# Patient Record
Sex: Male | Born: 1940 | Race: White | Hispanic: No | Smoking: Former smoker
Health system: Southern US, Community
[De-identification: ages and names within clinical notes are randomized; demographics above are authoritative.]

## PROBLEM LIST (undated history)

## (undated) DIAGNOSIS — N2889 Other specified disorders of kidney and ureter: Secondary | ICD-10-CM

## (undated) DIAGNOSIS — R972 Elevated prostate specific antigen [PSA]: Secondary | ICD-10-CM

## (undated) DIAGNOSIS — N471 Phimosis: Secondary | ICD-10-CM

## (undated) DIAGNOSIS — N189 Chronic kidney disease, unspecified: Secondary | ICD-10-CM

## (undated) DIAGNOSIS — R319 Hematuria, unspecified: Secondary | ICD-10-CM

## (undated) DIAGNOSIS — Z8719 Personal history of other diseases of the digestive system: Secondary | ICD-10-CM

## (undated) DIAGNOSIS — I219 Acute myocardial infarction, unspecified: Secondary | ICD-10-CM

## (undated) DIAGNOSIS — N400 Enlarged prostate without lower urinary tract symptoms: Secondary | ICD-10-CM

## (undated) DIAGNOSIS — R310 Gross hematuria: Secondary | ICD-10-CM

## (undated) DIAGNOSIS — I10 Essential (primary) hypertension: Secondary | ICD-10-CM

## (undated) DIAGNOSIS — K219 Gastro-esophageal reflux disease without esophagitis: Secondary | ICD-10-CM

## (undated) DIAGNOSIS — R0600 Dyspnea, unspecified: Secondary | ICD-10-CM

## (undated) HISTORY — DX: Hematuria, unspecified: R31.9

## (undated) HISTORY — DX: Phimosis: N47.1

## (undated) HISTORY — DX: Gross hematuria: R31.0

## (undated) HISTORY — DX: Benign prostatic hyperplasia without lower urinary tract symptoms: N40.0

## (undated) HISTORY — DX: Elevated prostate specific antigen (PSA): R97.20

## (undated) HISTORY — DX: Other specified disorders of kidney and ureter: N28.89

---

## 2000-06-18 HISTORY — PX: CORONARY ARTERY BYPASS GRAFT: SHX141

## 2009-06-18 HISTORY — PX: INGUINAL HERNIA REPAIR: SUR1180

## 2012-06-18 HISTORY — PX: RENAL BIOPSY: SHX156

## 2012-08-04 ENCOUNTER — Ambulatory Visit: Payer: Self-pay

## 2012-09-15 ENCOUNTER — Ambulatory Visit: Payer: Self-pay | Admitting: Urology

## 2012-09-15 LAB — APTT: Activated PTT: 28.2 secs (ref 23.6–35.9)

## 2012-09-15 LAB — PROTIME-INR: Prothrombin Time: 12.3 secs (ref 11.5–14.7)

## 2012-09-15 LAB — PLATELET COUNT: Platelet: 199 10*3/uL (ref 150–440)

## 2012-09-17 LAB — PATHOLOGY REPORT

## 2013-05-26 ENCOUNTER — Ambulatory Visit: Payer: Self-pay | Admitting: Urology

## 2014-02-19 LAB — LIPID PANEL
Cholesterol: 228 mg/dL — AB (ref 0–200)
HDL: 31 mg/dL — AB (ref 35–70)
LDL CALC: 143 mg/dL
TRIGLYCERIDES: 269 mg/dL — AB (ref 40–160)

## 2014-02-21 LAB — CBC AND DIFFERENTIAL: HEMOGLOBIN: 13.5 g/dL (ref 13.5–17.5)

## 2014-08-20 DIAGNOSIS — I2510 Atherosclerotic heart disease of native coronary artery without angina pectoris: Secondary | ICD-10-CM | POA: Diagnosis not present

## 2014-08-20 DIAGNOSIS — I10 Essential (primary) hypertension: Secondary | ICD-10-CM | POA: Diagnosis not present

## 2014-08-20 DIAGNOSIS — N289 Disorder of kidney and ureter, unspecified: Secondary | ICD-10-CM | POA: Diagnosis not present

## 2014-08-20 DIAGNOSIS — E785 Hyperlipidemia, unspecified: Secondary | ICD-10-CM | POA: Diagnosis not present

## 2014-08-20 DIAGNOSIS — D692 Other nonthrombocytopenic purpura: Secondary | ICD-10-CM | POA: Diagnosis not present

## 2014-08-20 LAB — BASIC METABOLIC PANEL
BUN: 24 mg/dL — AB (ref 4–21)
Creatinine: 1.4 mg/dL — AB (ref <=1.3)

## 2015-04-03 ENCOUNTER — Encounter: Payer: Self-pay | Admitting: Internal Medicine

## 2015-04-06 ENCOUNTER — Other Ambulatory Visit: Payer: Self-pay

## 2015-04-06 DIAGNOSIS — J3489 Other specified disorders of nose and nasal sinuses: Secondary | ICD-10-CM | POA: Insufficient documentation

## 2015-04-06 DIAGNOSIS — I25119 Atherosclerotic heart disease of native coronary artery with unspecified angina pectoris: Secondary | ICD-10-CM | POA: Insufficient documentation

## 2015-04-06 DIAGNOSIS — I739 Peripheral vascular disease, unspecified: Secondary | ICD-10-CM | POA: Insufficient documentation

## 2015-04-06 DIAGNOSIS — I10 Essential (primary) hypertension: Secondary | ICD-10-CM | POA: Insufficient documentation

## 2015-04-06 DIAGNOSIS — E785 Hyperlipidemia, unspecified: Secondary | ICD-10-CM | POA: Insufficient documentation

## 2015-04-06 DIAGNOSIS — N051 Unspecified nephritic syndrome with focal and segmental glomerular lesions: Secondary | ICD-10-CM | POA: Insufficient documentation

## 2015-04-06 DIAGNOSIS — R310 Gross hematuria: Secondary | ICD-10-CM | POA: Insufficient documentation

## 2015-04-06 DIAGNOSIS — G629 Polyneuropathy, unspecified: Secondary | ICD-10-CM | POA: Insufficient documentation

## 2015-04-06 DIAGNOSIS — D692 Other nonthrombocytopenic purpura: Secondary | ICD-10-CM | POA: Insufficient documentation

## 2015-05-26 ENCOUNTER — Ambulatory Visit (INDEPENDENT_AMBULATORY_CARE_PROVIDER_SITE_OTHER): Payer: Medicare Other | Admitting: Internal Medicine

## 2015-05-26 ENCOUNTER — Encounter: Payer: Self-pay | Admitting: Internal Medicine

## 2015-05-26 VITALS — BP 142/86 | HR 60 | Ht 72.0 in | Wt 208.0 lb

## 2015-05-26 DIAGNOSIS — Z23 Encounter for immunization: Secondary | ICD-10-CM | POA: Diagnosis not present

## 2015-05-26 DIAGNOSIS — I739 Peripheral vascular disease, unspecified: Secondary | ICD-10-CM

## 2015-05-26 DIAGNOSIS — N289 Disorder of kidney and ureter, unspecified: Secondary | ICD-10-CM | POA: Diagnosis not present

## 2015-05-26 DIAGNOSIS — I2510 Atherosclerotic heart disease of native coronary artery without angina pectoris: Secondary | ICD-10-CM

## 2015-05-26 DIAGNOSIS — H6123 Impacted cerumen, bilateral: Secondary | ICD-10-CM

## 2015-05-26 DIAGNOSIS — G629 Polyneuropathy, unspecified: Secondary | ICD-10-CM | POA: Diagnosis not present

## 2015-05-26 DIAGNOSIS — N401 Enlarged prostate with lower urinary tract symptoms: Secondary | ICD-10-CM | POA: Diagnosis not present

## 2015-05-26 DIAGNOSIS — R351 Nocturia: Secondary | ICD-10-CM | POA: Diagnosis not present

## 2015-05-26 DIAGNOSIS — Z0000 Encounter for general adult medical examination without abnormal findings: Secondary | ICD-10-CM | POA: Diagnosis not present

## 2015-05-26 DIAGNOSIS — I10 Essential (primary) hypertension: Secondary | ICD-10-CM | POA: Diagnosis not present

## 2015-05-26 DIAGNOSIS — E785 Hyperlipidemia, unspecified: Secondary | ICD-10-CM

## 2015-05-26 LAB — POCT URINALYSIS DIPSTICK
Bilirubin, UA: NEGATIVE
Glucose, UA: NEGATIVE
KETONES UA: NEGATIVE
Leukocytes, UA: NEGATIVE
Nitrite, UA: NEGATIVE
PH UA: 5
SPEC GRAV UA: 1.02
Urobilinogen, UA: 0.2

## 2015-05-26 MED ORDER — LISINOPRIL 40 MG PO TABS: 40.0000 mg | ORAL_TABLET | Freq: Every day | ORAL | Status: DC

## 2015-05-26 MED ORDER — METOPROLOL TARTRATE 50 MG PO TABS
50.0000 mg | ORAL_TABLET | Freq: Two times a day (BID) | ORAL | Status: DC
Start: 2015-05-26 — End: 2016-06-29

## 2015-05-26 MED ORDER — ATORVASTATIN CALCIUM 40 MG PO TABS: 40.0000 mg | ORAL_TABLET | Freq: Every day | ORAL | Status: DC

## 2015-05-26 MED ORDER — TAMSULOSIN HCL 0.4 MG PO CAPS: 0.4000 mg | ORAL_CAPSULE | Freq: Every day | ORAL | Status: DC

## 2015-05-26 NOTE — Progress Notes (Signed)
Patient: Arthur Bowen, Male    DOB: 11-30-1940, 74 y.o.   MRN: [id number] Visit Date: 05/26/2015  Today's Provider: Halina Maidens, MD   Chief Complaint  Patient presents with  . Medicare Wellness  . Hypertension   Subjective:    Annual wellness visit Arthur Bowen is a 74 y.o. male who presents today for his Subsequent Annual Wellness Visit. He feels well. He reports exercising by walking and hunting.Marland Kitchen He reports he is sleeping well.   ----------------------------------------------------------- Hypertension This is a chronic problem. The current episode started more than 1 year ago. The problem is unchanged. The problem is controlled. Pertinent negatives include no chest pain, headaches, malaise/fatigue, palpitations, peripheral edema or shortness of breath. Past treatments include beta blockers and ACE inhibitors. The current treatment provides significant improvement. There are no compliance problems.  Hypertensive end-organ damage includes kidney disease and CAD/MI. Identifiable causes of hypertension include chronic renal disease.  Hyperlipidemia This is a chronic problem. The problem is controlled. Recent lipid tests were reviewed and are normal. Exacerbating diseases include chronic renal disease. He has no history of diabetes. Pertinent negatives include no chest pain or shortness of breath. Current antihyperlipidemic treatment includes statins. The current treatment provides significant improvement of lipids. There are no compliance problems.   CAD - He had CABG in 2002 and has done well.  He walks 1/2 mile per day without SOB or chest pain.  He denies claudication.  He does not see a cardiologist.  He has been complaint with his medication.  Review of Systems  Constitutional: Negative for fever, chills, malaise/fatigue, diaphoresis and fatigue.  HENT: Positive for hearing loss. Negative for sore throat, trouble swallowing and voice change.   Eyes: Negative for visual disturbance.   Respiratory: Negative for chest tightness, shortness of breath and wheezing.   Cardiovascular: Negative for chest pain, palpitations and leg swelling.  Gastrointestinal: Negative for abdominal pain, diarrhea, constipation and blood in stool.  Genitourinary: Positive for urgency (nocturia x 2). Negative for dysuria and difficulty urinating.  Musculoskeletal: Negative for back pain and joint swelling.  Neurological: Negative for dizziness and headaches. Numbness: neuropathy in both feet.  Hematological: Negative for adenopathy. Bruises/bleeds easily.  Psychiatric/Behavioral: Negative for sleep disturbance, dysphoric mood and decreased concentration.    Social History   Social History  . Marital Status: Divorced    Spouse Name: N/A  . Number of Children: N/A  . Years of Education: N/A   Occupational History  . Not on file.   Social History Main Topics  . Smoking status: Former Research scientist (life sciences)  . Smokeless tobacco: Not on file  . Alcohol Use: Not on file  . Drug Use: Not on file  . Sexual Activity: Not on file   Other Topics Concern  . Not on file   Social History Narrative    Patient Active Problem List   Diagnosis Date Noted  . CAD in native artery 04/06/2015  . Dyslipidemia 04/06/2015  . Essential (primary) hypertension 04/06/2015  . Frank hematuria 04/06/2015  . Neuropathy ([place]) 04/06/2015  . Dry nares 04/06/2015  . Peripheral vascular disease ([place]) 04/06/2015  . Impaired renal function 04/06/2015  . Senile purpura ([place]) 04/06/2015    Past Surgical History  Procedure Laterality Date  . Coronary artery bypass graft  2002    x3 @ [place]  . Inguinal hernia repair Left 2011  . Renal biopsy  2014    neg in w/u renal mass/hematuria    His family history includes Lung cancer  in his father; Lymphoma in his mother.    Previous Medications   ASPIRIN 81 MG TABLET    Take 1 tablet by mouth daily.   ATORVASTATIN (LIPITOR) 40 MG TABLET    Take 1 tablet by mouth daily.    CYANOCOBALAMIN 100 MCG LOZG    Take by mouth.   LISINOPRIL (PRINIVIL,ZESTRIL) 40 MG TABLET    Take 1 tablet by mouth daily.   METOPROLOL (LOPRESSOR) 50 MG TABLET    Take 1 tablet by mouth 2 (two) times daily.   OMEGA-3 FATTY ACIDS (FISH OIL) 1000 MG CPDR    Take 1 capsule by mouth daily.   TAMSULOSIN (FLOMAX) 0.4 MG CAPS CAPSULE    Take 1 capsule by mouth daily.    No care team member to display     Objective:   Vitals: BP 142/86 mmHg  Pulse 60  Ht 6' (1.829 m)  Wt 208 lb (94.348 kg)  BMI 28.20 kg/m2  Physical Exam  Constitutional: He is oriented to person, place, and time. He appears well-developed and well-nourished.  HENT:  Head: Normocephalic.  Right Ear: External ear normal. Decreased hearing is noted.  Left Ear: External ear normal. Decreased hearing is noted.  Nose: Nose normal.  Mouth/Throat: Uvula is midline and oropharynx is clear and moist.  Impacted cerumen bilaterally  Eyes: Conjunctivae and EOM are normal. Pupils are equal, round, and reactive to light.  Neck: Normal range of motion. Neck supple. Carotid bruit is not present. No thyromegaly present.  Cardiovascular: Normal rate, regular rhythm and normal heart sounds.   Pulses:      Dorsalis pedis pulses are 1+ on the right side, and 1+ on the left side.       Posterior tibial pulses are 1+ on the right side, and 1+ on the left side.  Pulmonary/Chest: Effort normal and breath sounds normal. He has no wheezes. Right breast exhibits no mass. Left breast exhibits no mass.  Abdominal: Soft. Normal appearance and bowel sounds are normal. There is no hepatosplenomegaly. There is no tenderness.  Musculoskeletal: Normal range of motion. He exhibits tenderness. He exhibits no edema.  Lymphadenopathy:    He has no cervical adenopathy.  Neurological: He is alert and oriented to person, place, and time. He has normal strength and normal reflexes. No cranial nerve deficit.  Skin: Skin is warm and dry. Ecchymosis noted.   Multiple AK over back and chest  Psychiatric: He has a normal mood and affect. His speech is normal and behavior is normal. Judgment and thought content normal.  Nursing note and vitals reviewed.   Activities of Daily Living In your present state of health, do you have any difficulty performing the following activities: 05/26/2015  Hearing? N  Vision? N  Difficulty concentrating or making decisions? N  Walking or climbing stairs? N  Dressing or bathing? N  Doing errands, shopping? N    Fall Risk Assessment Fall Risk  05/26/2015  Falls in the past year? No     Patient reports there are safety devices in place in shower at home.   Depression Screen PHQ 2/9 Scores 05/26/2015  PHQ - 2 Score 0    Cognitive Testing - 6-CIT   Correct? Score   What year is it? yes 0 Yes = 0    No = 4  What month is it? yes 0 Yes = 0    No = 3  Remember:     Pia Mau, [place], [place]  What time is it? yes 0 Yes = 0    No = 3  Count backwards from 20 to 1 yes 0 Correct = 0    1 error = 2   More than 1 error = 4  Say the months of the year in reverse. no 2 Correct = 0    1 error = 2   More than 1 error = 4  What address did I ask you to remember? no 2 Correct = 0  1 error = 2    2 error = 4    3 error = 6    4 error = 8    All wrong = 10       TOTAL SCORE  4/28   Interpretation:  Normal  Normal (0-7) Abnormal (8-28)        Assessment & Plan:     Annual Wellness Visit  Reviewed patient's Family Medical History Reviewed and updated list of patient's medical providers Assessment of cognitive impairment was done Assessed patient's functional ability Established a written schedule for health screening [place] Completed and Reviewed  Exercise Activities and Dietary recommendations Goals    None      Immunization History  Administered Date(s) Administered  . Pneumococcal Conjugate-13 02/19/2014  . Pneumococcal Polysaccharide-23 03/20/2012  . Tdap  03/20/2012    Health Maintenance  Topic Date Due  . ZOSTAVAX  09/09/1998  . INFLUENZA VACCINE  01/17/2015  . TETANUS/TDAP  03/20/2022  . PNA vac Low Risk Adult  Completed     Discussed health benefits of physical activity, and encouraged him to engage in regular exercise appropriate for his age and condition.    ------------------------------------------------------------------------------------------------------------  1. Medicare annual wellness visit, subsequent Medicare wellness measures are satisfied - POCT urinalysis dipstick  2. Flu vaccine need - Flu Vaccine QUAD 36+ mos PF IM (Fluarix & Fluzone Quad PF)  3. CAD in native artery Stable, status post CABG in 2002 Continue current medications and daily physical activity  4. Essential (primary) hypertension Adequate control on 2 medications - lisinopril (PRINIVIL,ZESTRIL) 40 MG tablet; Take 1 tablet (40 mg total) by mouth daily.  Dispense: 90 tablet; Refill: 3 - metoprolol (LOPRESSOR) 50 MG tablet; Take 1 tablet (50 mg total) by mouth 2 (two) times daily.  Dispense: 180 tablet; Refill: 3 - CBC with Differential/Platelet - Comprehensive metabolic panel  5. Peripheral vascular disease (HCC) Sufficient pulses in both feet without symptoms of claudication  6. Neuropathy (HCC) Stable; did not respond to gabapentin  7. Dyslipidemia On statin therapy - atorvastatin (LIPITOR) 40 MG tablet; Take 1 tablet (40 mg total) by mouth daily.  Dispense: 90 tablet; Refill: 3 - Lipid panel  8. BPH associated with nocturia Well-controlled on Flomax - tamsulosin (FLOMAX) 0.4 MG CAPS capsule; Take 1 capsule (0.4 mg total) by mouth daily.  Dispense: 90 capsule; Refill: 3 - PSA  9. Cerumen impaction, bilateral Recommend ENT for cleaning   Halina Maidens, MD [facility]  05/26/2015

## 2015-05-26 NOTE — Patient Instructions (Addendum)
Health Maintenance  Topic Date Due  . ZOSTAVAX  09/09/1998  . INFLUENZA VACCINE  01/17/2015  . TETANUS/TDAP  03/20/2022  . PNA vac Low Risk Adult  Completed   You have impacted cerumen in both ears.  Please schedule an appointment with ENT to have your ears cleaned.

## 2015-05-27 LAB — CBC WITH DIFFERENTIAL/PLATELET
BASOS: 0 %
Basophils Absolute: 0 10*3/uL (ref 0.0–0.2)
EOS (ABSOLUTE): 0.6 10*3/uL — ABNORMAL HIGH (ref 0.0–0.4)
EOS: 7 %
HEMATOCRIT: 42.5 % (ref 37.5–51.0)
Hemoglobin: 14 g/dL (ref 12.6–17.7)
IMMATURE GRANS (ABS): 0 10*3/uL (ref 0.0–0.1)
IMMATURE GRANULOCYTES: 0 %
Lymphocytes Absolute: 2.7 10*3/uL (ref 0.7–3.1)
Lymphs: 32 %
MCH: 30 pg (ref 26.6–33.0)
MCHC: 32.9 g/dL (ref 31.5–35.7)
MCV: 91 fL (ref 79–97)
MONOS ABS: 0.5 10*3/uL (ref 0.1–0.9)
Monocytes: 6 %
NEUTROS ABS: 4.6 10*3/uL (ref 1.4–7.0)
NEUTROS PCT: 55 %
Platelets: 258 10*3/uL (ref 150–379)
RBC: 4.66 x10E6/uL (ref 4.14–5.80)
RDW: 14.8 % (ref 12.3–15.4)
WBC: 8.4 10*3/uL (ref 3.4–10.8)

## 2015-05-27 LAB — COMPREHENSIVE METABOLIC PANEL
A/G RATIO: 1.9 (ref 1.1–2.5)
ALT: 22 IU/L (ref 0–44)
AST: 20 IU/L (ref 0–40)
Albumin: 3.8 g/dL (ref 3.5–4.8)
Alkaline Phosphatase: 120 IU/L — ABNORMAL HIGH (ref 39–117)
BILIRUBIN TOTAL: 0.4 mg/dL (ref 0.0–1.2)
BUN/Creatinine Ratio: 18 (ref 10–22)
BUN: 31 mg/dL — ABNORMAL HIGH (ref 8–27)
CALCIUM: 9.2 mg/dL (ref 8.6–10.2)
CHLORIDE: 107 mmol/L — AB (ref 97–106)
CO2: 21 mmol/L (ref 18–29)
Creatinine, Ser: 1.69 mg/dL — ABNORMAL HIGH (ref 0.76–1.27)
GFR calc Af Amer: 45 mL/min/{1.73_m2} — ABNORMAL LOW (ref >59)
GFR, EST NON AFRICAN AMERICAN: 39 mL/min/{1.73_m2} — AB (ref >59)
GLOBULIN, TOTAL: 2 g/dL (ref 1.5–4.5)
Glucose: 93 mg/dL (ref 65–99)
POTASSIUM: 4.7 mmol/L (ref 3.5–5.2)
SODIUM: 145 mmol/L — AB (ref 136–144)
TOTAL PROTEIN: 5.8 g/dL — AB (ref 6.0–8.5)

## 2015-05-27 LAB — LIPID PANEL
CHOL/HDL RATIO: 5.5 ratio — AB (ref 0.0–5.0)
Cholesterol, Total: 188 mg/dL (ref 100–199)
HDL: 34 mg/dL — ABNORMAL LOW (ref >39)
LDL Calculated: 113 mg/dL — ABNORMAL HIGH (ref 0–99)
Triglycerides: 203 mg/dL — ABNORMAL HIGH (ref 0–149)
VLDL Cholesterol Cal: 41 mg/dL — ABNORMAL HIGH (ref 5–40)

## 2015-05-27 LAB — PSA: PROSTATE SPECIFIC AG, SERUM: 7.6 ng/mL — AB (ref 0.0–4.0)

## 2015-08-03 ENCOUNTER — Telehealth: Payer: Self-pay | Admitting: Urology

## 2015-08-03 DIAGNOSIS — N2889 Other specified disorders of kidney and ureter: Secondary | ICD-10-CM

## 2015-08-03 NOTE — Telephone Encounter (Signed)
Pt's daughter, Jones Broom, called to make pt a f/up appt.  Pt was seen by Dr. Army Melia and his PSA was elevated.  Pt's daughter is requesting that an order be put in for a renal ultrasound so that they can get this completed before his appt on 2/23 so that he does not have to pay two copays to be seen.  Please give pt's daughter a call to verify if an order has been put in for them to have an ultrasound done.  Pt would like to have this done at the [facility].

## 2015-08-03 NOTE — Telephone Encounter (Signed)
Spoke with Tammie, pt daughter, and made aware pt could have RUS prior to appt. Orders placed and Tammie made aware.

## 2015-08-03 NOTE — Telephone Encounter (Signed)
That's fine to to have the RUS performed before the appointment.

## 2015-08-08 ENCOUNTER — Ambulatory Visit
Admission: RE | Admit: 2015-08-08 | Discharge: 2015-08-08 | Disposition: A | Discharge status: Home / Self Care | Payer: Medicare Other | Source: Ambulatory Visit | Attending: Urology | Admitting: Urology

## 2015-08-08 ENCOUNTER — Encounter: Payer: Self-pay | Admitting: *Deleted

## 2015-08-08 DIAGNOSIS — N281 Cyst of kidney, acquired: Secondary | ICD-10-CM | POA: Insufficient documentation

## 2015-08-08 DIAGNOSIS — N2889 Other specified disorders of kidney and ureter: Secondary | ICD-10-CM | POA: Diagnosis not present

## 2015-08-11 ENCOUNTER — Encounter: Payer: Self-pay | Admitting: Urology

## 2015-08-11 ENCOUNTER — Ambulatory Visit (INDEPENDENT_AMBULATORY_CARE_PROVIDER_SITE_OTHER): Payer: Medicare Other | Admitting: Urology

## 2015-08-11 VITALS — BP 198/82 | HR 61 | Ht 72.0 in | Wt 206.2 lb

## 2015-08-11 DIAGNOSIS — N401 Enlarged prostate with lower urinary tract symptoms: Secondary | ICD-10-CM | POA: Diagnosis not present

## 2015-08-11 DIAGNOSIS — R972 Elevated prostate specific antigen [PSA]: Secondary | ICD-10-CM | POA: Diagnosis not present

## 2015-08-11 DIAGNOSIS — N2889 Other specified disorders of kidney and ureter: Secondary | ICD-10-CM

## 2015-08-11 DIAGNOSIS — Z87448 Personal history of other diseases of urinary system: Secondary | ICD-10-CM | POA: Diagnosis not present

## 2015-08-11 DIAGNOSIS — N471 Phimosis: Secondary | ICD-10-CM | POA: Diagnosis not present

## 2015-08-11 DIAGNOSIS — N138 Other obstructive and reflux uropathy: Secondary | ICD-10-CM

## 2015-08-11 MED ORDER — FINASTERIDE 5 MG PO TABS: 5.0000 mg | ORAL_TABLET | Freq: Every day | ORAL | Status: DC

## 2015-08-11 MED ORDER — TAMSULOSIN HCL 0.4 MG PO CAPS: 0.4000 mg | ORAL_CAPSULE | Freq: Every day | ORAL | Status: DC

## 2015-08-11 NOTE — Progress Notes (Signed)
08/11/2015 9:45 PM   Arthur Bowen 1940/08/02 [id number]  Referring provider: No referring provider defined for this encounter.  Chief Complaint  Patient presents with  . Elevated PSA    Primary care states pt needs to be seen  . Results    [place]    HPI: Patient is a 75 year old Caucasian male with a history of gross hematuria, left renal mass, elevated PSA and BPH with LUTS who presents today at the request of his primary care physician, Dr. Army Melia  History of gross hematuria Patient underwent a hematuria work up in 2014 with a CT urogram and cystoscopy for gross hematuria.  CT Urogram demonstrated multiple hypodensities in both kidneys compatible renal cysts. An abnormal slightly hyperdense mass measuring 4 x 8 cm by 4 x 4 centimeters in the mid pole of the left kidney. Cystoscopy with Dr. Elnoria Howard demonstrated a very phimosis, trilobar hypertrophy and grade 1 trabeculation.  He denies any episodes of gross hematuria.    Left renal mass Patient underwent CT-guided biopsy of the left renal mass on 09/15/2012. Biopsy result noted mainly blood and scattered hemosiderin laden macro phage is compatible with contents of a hemorrhagic cyst with no malignant cells seen in the specimen.  A repeated ultrasound in December 2014 noted no change in the left renal mass.  The patient was then lost to follow-up until he was found to have an elevated PSA with his primary care physician and referred back to [place].  An ultrasound completed on 08/08/2015 noted that the midpole left renal mass had increased from [phone] cm to [phone] cm in size.  Elevated PSA Patient's PSA was found to be 7.6 ng/mL on 05/26/2015 at his primary care physician's office.  He does not have a family history of prostate cancer.  We'll repeat the PSA value today to rule out laboratory error.  BPH WITH LUTS Patient was found to have trilobar hypertrophy and trabeculation on a cystoscopic exam in 2014.   He was having  difficulty with nocturia at that time. He was prescribed finasteride and tamsulosin, but he did not take the medication because he read that they could cause cancer. He denies any dysuria, hematuria or suprapubic pain.  He also denies any recent fevers, chills, nausea or vomiting.  He does not have a family history of PCa.   He states now that he does have some weak urinary stream, nocturia and hesitancy.     PMH: Past Medical History  Diagnosis Date  . Hematuria   . Renal mass, left   . BPH (benign prostatic hypertrophy)   . Gross hematuria   . Elevated PSA   . Acquired phimosis     Surgical History: Past Surgical History  Procedure Laterality Date  . Coronary artery bypass graft  2002    x3 @ [place]  . Inguinal hernia repair Left 2011  . Renal biopsy  2014    neg in w/u renal mass/hematuria    Home Medications:    Medication List       This list is accurate as of: 08/11/15 11:59 PM.  Always use your most recent med list.               aspirin 81 MG tablet  Take 1 tablet by mouth daily.     atorvastatin 40 MG tablet  Commonly known as:  LIPITOR  Take 1 tablet (40 mg total) by mouth daily.     Cyanocobalamin 100 MCG Lozg  Take by  mouth. Reported on 08/11/2015     VITAMIN B 12 PO  Take by mouth daily.     finasteride 5 MG tablet  Commonly known as:  PROSCAR  Take 1 tablet (5 mg total) by mouth daily.     Fish Oil 1000 MG Cpdr  Take 1 capsule by mouth daily.     lisinopril 40 MG tablet  Commonly known as:  PRINIVIL,ZESTRIL  Take 1 tablet (40 mg total) by mouth daily.     metoprolol 50 MG tablet  Commonly known as:  LOPRESSOR  Take 1 tablet (50 mg total) by mouth 2 (two) times daily.     tamsulosin 0.4 MG Caps capsule  Commonly known as:  FLOMAX  Take 1 capsule (0.4 mg total) by mouth daily.     tamsulosin 0.4 MG Caps capsule  Commonly known as:  FLOMAX  Take 1 capsule (0.4 mg total) by mouth daily.        Allergies:  Allergies  Allergen  Reactions  . Penicillins Other (See Comments)  . Sulfa Antibiotics Other (See Comments)    Family History: Family History  Problem Relation Age of Onset  . Lymphoma Mother   . Lung cancer Father   . Heart disease Brother   . Hodgkin's lymphoma Mother   . Kidney disease Neg Hx   . Prostate cancer Neg Hx     Social History:  reports that he has been smoking.  He does not have any smokeless tobacco history on file. He reports that he does not drink alcohol or use illicit drugs.  ROS: UROLOGY Frequent Urination?: No Hard to postpone urination?: No Burning/pain with urination?: No Get up at night to urinate?: No Leakage of urine?: No Urine stream starts and stops?: No Trouble starting stream?: No Do you have to strain to urinate?: No Blood in urine?: No Urinary tract infection?: No Sexually transmitted disease?: No Injury to kidneys or bladder?: No Painful intercourse?: No Weak stream?: No Erection problems?: No Penile pain?: No  Gastrointestinal Nausea?: No Vomiting?: No Indigestion/heartburn?: No Diarrhea?: No Constipation?: No  Constitutional Fever: No Night sweats?: No Weight loss?: No Fatigue?: No  Skin Skin rash/lesions?: No Itching?: No  Eyes Blurred vision?: No Double vision?: No  Ears/Nose/Throat Sore throat?: No Sinus problems?: No  Hematologic/Lymphatic Swollen glands?: No Easy bruising?: No  Cardiovascular Leg swelling?: No Chest pain?: No  Respiratory Cough?: No Shortness of breath?: No  Endocrine Excessive thirst?: No  Musculoskeletal Back pain?: No Joint pain?: No  Neurological Headaches?: No Dizziness?: No  Psychologic Depression?: No Anxiety?: No  Physical Exam: BP 198/82 mmHg  Pulse 61  Ht 6' (1.829 m)  Wt 206 lb 3.2 oz (93.532 kg)  BMI 27.96 kg/m2  Constitutional: Well nourished. Alert and oriented, No acute distress. HEENT: [place] AT, moist mucus membranes. Trachea midline, no masses. Cardiovascular: No  clubbing, cyanosis, or edema. Respiratory: Normal respiratory effort, no increased work of breathing. GI: Abdomen is soft, non tender, non distended, no abdominal masses. Liver and spleen not palpable.  No hernias appreciated.  Stool sample for occult testing is not indicated.   GU: No CVA tenderness.  No bladder fullness or masses.  Patient with uncircumcised phallus. Foreskin not easily retracted  Sclerotic foreskin.  Urethral meatus is patent.  No penile discharge. No penile lesions or rashes. Scrotum without lesions, cysts, rashes and/or edema.  Testicles are located scrotally bilaterally. No masses are appreciated in the testicles. Left and right epididymis are normal. Rectal: Patient with  normal  sphincter tone. Anus and perineum without scarring or rashes. No rectal masses are appreciated. Prostate is approximately 50 grams, no nodules are appreciated. Seminal vesicles are normal. Skin: No rashes, bruises or suspicious lesions. Lymph: No cervical or inguinal adenopathy. Neurologic: Grossly intact, no focal deficits, moving all 4 extremities. Psychiatric: Normal mood and affect.  Laboratory Data: Lab Results  Component Value Date   WBC 8.4 05/26/2015   HGB 13.5 02/21/2014   HCT 42.5 05/26/2015   MCV 91 05/26/2015   PLT 258 05/26/2015    Lab Results  Component Value Date   CREATININE 1.41* 08/11/2015    PSA History  5.0 ng/mL on 06/26/2013  7.6 ng/mL on 05/27/2015     Component Value Date/Time   CHOL 188 05/26/2015 0914   CHOL 228* 02/19/2014   HDL 34* 05/26/2015 0914   HDL 31* 02/19/2014   CHOLHDL 5.5* 05/26/2015 0914   LDLCALC 113* 05/26/2015 0914   LDLCALC 143 02/19/2014    Lab Results  Component Value Date   AST 20 05/26/2015   Lab Results  Component Value Date   ALT 22 05/26/2015    Pertinent Imaging: CLINICAL DATA: Left renal mass. No flank pain. Prior biopsy of solid renal mass.  EXAM: RENAL / URINARY TRACT ULTRASOUND COMPLETE  COMPARISON: CT  08/04/2012. Ultrasound 05/26/2013.  FINDINGS: Right Kidney:  Length: 12.7 cm. Multiple small cysts, the largest 2.9 cm in the upper pole which appear benign. Echogenicity within normal limits. No mass or hydronephrosis visualized.  Left Kidney:  Length: 13.9 cm. Multiple cysts, the largest 2.9 cm in the upper pole. Isoechoic solid mass in the midpole measures 5.0 x 4.4 x 3.6 cm compared with 4.5 x 4.4 x 4.0 cm previously. No hydronephrosis.  Bladder:  Appears normal for degree of bladder distention. Prominence of the prostate.  IMPRESSION: Solid left midpole renal mass again noted, slightly larger when compared to prior study from 2014. Recommend correlation with biopsy results. Cannot exclude malignancy.  Bilateral renal cysts.  No hydronephrosis.   Electronically Signed  By: Rolm Baptise M.D.  On: 08/08/2015 11:56        Assessment & Plan:    1. History of gross hematuria:   Patient underwent hematuria workup in 2014 with CT urogram and cystoscopy.  He was found to have a left renal hyperdense mass which was biopsied  (see renal mass)  and trilobar hypertrophy with grade 1 trabeculation.   He has not had any recent gross hematuria.  He did not provide a urine specimen today.  He will be returning for circumcision and a CT scan report, we obtained a urinalysis at that time.    2. Left rena mass:   Patient was found to have a left hyperdense mass in his left kidney on a CT Urogram in 2014. It was biopsied and no malignancy was found. It has been shown to be increasing in size on a recent renal ultrasound.   He will be undergoing a CT scan of the abdomen with and without contrast for further evaluation of this area.   He will have a follow-up appointment with Dr. Erlene Quan to discuss these results.  3.  Elevated PSA:   Patient was found to have an elevated PSA of 7.6 ng/mL on 05/27/2015. I will repeat the PSA test today to ensure this is a true value and not  laboratory error.  He will follow up based on the results.  4. BPH with LUTS:   Patient is restarted on his tamsulosin  and finasteride.  His PSA was recently found to be elevated and it is repeated today.  His follow-up will be based on his repeated PSA value.  If his PSA has returned to baseline, he'll return in 3 months for I PSS score and exam.  If it returns elevated, patient has an upcoming appointment with Dr. Erlene Quan and the next step can be discussed at that time.  - PSA  5. Phimosis:   Patient was found to have severe phimosis on today's exam. He would like to be scheduled for an office circumcision.  I described to him how the procedure is performed.  The risk of the procedure such as bleeding, infection and pain are discussed with the patient. I stated that the average recovery time is approximately 1-2 weeks. He voiced his understanding and would like to schedule the procedure.   Return for UA, circumsicion in office; CT scan report with Dr. Erlene Quan.  These notes generated with voice recognition software. I apologize for typographical errors.  Zara Council, [facility] [address], [place], [place] [address] [phone]

## 2015-08-12 LAB — PSA: PROSTATE SPECIFIC AG, SERUM: 5.2 ng/mL — AB (ref 0.0–4.0)

## 2015-08-12 LAB — BUN+CREAT
BUN/Creatinine Ratio: 16 (ref 10–22)
BUN: 23 mg/dL (ref 8–27)
CREATININE: 1.41 mg/dL — AB (ref 0.76–1.27)
GFR, EST AFRICAN AMERICAN: 56 mL/min/{1.73_m2} — AB (ref >59)
GFR, EST NON AFRICAN AMERICAN: 48 mL/min/{1.73_m2} — AB (ref >59)

## 2015-08-14 ENCOUNTER — Telehealth: Payer: Self-pay | Admitting: Urology

## 2015-08-14 DIAGNOSIS — N471 Phimosis: Secondary | ICD-10-CM | POA: Insufficient documentation

## 2015-08-14 DIAGNOSIS — N2889 Other specified disorders of kidney and ureter: Secondary | ICD-10-CM | POA: Insufficient documentation

## 2015-08-14 DIAGNOSIS — R972 Elevated prostate specific antigen [PSA]: Principal | ICD-10-CM

## 2015-08-14 DIAGNOSIS — N401 Enlarged prostate with lower urinary tract symptoms: Secondary | ICD-10-CM

## 2015-08-14 DIAGNOSIS — N138 Other obstructive and reflux uropathy: Secondary | ICD-10-CM | POA: Insufficient documentation

## 2015-08-14 DIAGNOSIS — Z87448 Personal history of other diseases of urinary system: Secondary | ICD-10-CM | POA: Insufficient documentation

## 2015-08-14 NOTE — Telephone Encounter (Signed)
Would you please call the patient and remind him to stop his ASA 10 days before his circumcision?

## 2015-08-16 ENCOUNTER — Telehealth: Payer: Self-pay

## 2015-08-16 NOTE — Telephone Encounter (Signed)
LMOM

## 2015-08-16 NOTE — Telephone Encounter (Signed)
Spoke with pt wife, Tammie, in reference to stopping ASA 10 days prior to circ. Tammie stated she had it on her calender and will make sure pt stops.

## 2015-08-16 NOTE — Telephone Encounter (Signed)
-----   Message from Nori Riis, PA-C sent at 08/12/2015  3:07 PM EST ----- PSA has declined.  His CT abdomen with and without is pending.

## 2015-08-17 NOTE — Telephone Encounter (Signed)
Spoke with pt in reference to PSA results and CT. Pt stated he was able to schedule CT yesterday. Reinforced with pt to keep f/u appt. Pt voiced understanding.

## 2015-08-24 ENCOUNTER — Ambulatory Visit
Admission: RE | Admit: 2015-08-24 | Discharge: 2015-08-24 | Disposition: A | Discharge status: Home / Self Care | Payer: Medicare Other | Source: Ambulatory Visit | Attending: Urology | Admitting: Urology

## 2015-08-24 DIAGNOSIS — K8020 Calculus of gallbladder without cholecystitis without obstruction: Secondary | ICD-10-CM | POA: Insufficient documentation

## 2015-08-24 DIAGNOSIS — N2889 Other specified disorders of kidney and ureter: Secondary | ICD-10-CM | POA: Diagnosis not present

## 2015-08-24 HISTORY — DX: Essential (primary) hypertension: I10

## 2015-08-24 MED ORDER — IOHEXOL 350 MG/ML SOLN
100.0000 mL | Freq: Once | INTRAVENOUS | Status: AC | PRN
  Administered 2015-08-24: 85 mL via INTRAVENOUS

## 2015-08-26 ENCOUNTER — Ambulatory Visit (INDEPENDENT_AMBULATORY_CARE_PROVIDER_SITE_OTHER): Payer: Medicare Other | Admitting: Internal Medicine

## 2015-08-26 ENCOUNTER — Encounter: Payer: Self-pay | Admitting: Internal Medicine

## 2015-08-26 VITALS — BP 122/86 | HR 52 | Temp 97.5°F | Resp 16 | Ht 73.0 in | Wt 205.6 lb

## 2015-08-26 DIAGNOSIS — N289 Disorder of kidney and ureter, unspecified: Secondary | ICD-10-CM

## 2015-08-26 DIAGNOSIS — H6123 Impacted cerumen, bilateral: Secondary | ICD-10-CM | POA: Diagnosis not present

## 2015-08-26 DIAGNOSIS — E785 Hyperlipidemia, unspecified: Secondary | ICD-10-CM

## 2015-08-26 DIAGNOSIS — J40 Bronchitis, not specified as acute or chronic: Secondary | ICD-10-CM | POA: Diagnosis not present

## 2015-08-26 DIAGNOSIS — R0981 Nasal congestion: Secondary | ICD-10-CM

## 2015-08-26 DIAGNOSIS — H6120 Impacted cerumen, unspecified ear: Secondary | ICD-10-CM | POA: Insufficient documentation

## 2015-08-26 MED ORDER — ATORVASTATIN CALCIUM 40 MG PO TABS: 40.0000 mg | ORAL_TABLET | Freq: Every day | ORAL | Status: DC

## 2015-08-26 MED ORDER — GUAIFENESIN-CODEINE 100-10 MG/5ML PO SYRP: 5.0000 mL | ORAL_SOLUTION | Freq: Every day | ORAL | Status: DC

## 2015-08-26 MED ORDER — FLUTICASONE PROPIONATE 50 MCG/ACT NA SUSP: 2.0000 | Freq: Every day | NASAL | Status: DC

## 2015-08-26 MED ORDER — DOXYCYCLINE HYCLATE 100 MG PO TABS: 100.0000 mg | ORAL_TABLET | Freq: Two times a day (BID) | ORAL | Status: DC

## 2015-08-26 NOTE — Patient Instructions (Signed)

## 2015-08-26 NOTE — Progress Notes (Signed)
Date:  08/26/2015   Name:  Arthur Bowen   DOB:  February 26, 1941   MRN:  [id number]   Chief Complaint: Cough Cough This is a new problem. The current episode started in the past 7 days. The problem has been unchanged. The problem occurs every few minutes. The cough is productive of sputum. Associated symptoms include shortness of breath. Pertinent negatives include no chest pain, chills, ear pain, fever, headaches, postnasal drip or wheezing. The symptoms are aggravated by lying down. He has tried nothing for the symptoms. smoking  Renal Insuff - he had cr 1.69 last visit.  Recently he had a CT and an Istat Cr was done.  This showed improvement so no labs are repeated today.   Lab Results  Component Value Date   CREATININE 1.41* 08/11/2015   CREATININE 1.69* 05/26/2015   CREATININE 1.4* 08/20/2014     Review of Systems  Constitutional: Negative for fever, chills and fatigue.  HENT: Positive for congestion. Negative for ear pain and postnasal drip.   Respiratory: Positive for cough and shortness of breath. Negative for chest tightness and wheezing.   Cardiovascular: Negative for chest pain and palpitations.  Neurological: Negative for dizziness, syncope and headaches.    Patient Active Problem List   Diagnosis Date Noted  . Elevated PSA 08/14/2015  . Renal mass 08/14/2015  . BPH with obstruction/lower urinary tract symptoms 08/14/2015  . History of hematuria 08/14/2015  . Phimosis 08/14/2015  . BPH associated with nocturia 05/26/2015  . CAD in native artery 04/06/2015  . Dyslipidemia 04/06/2015  . Essential (primary) hypertension 04/06/2015  . Neuropathy ([place]) 04/06/2015  . Dry nares 04/06/2015  . Peripheral vascular disease ([place]) 04/06/2015  . Impaired renal function 04/06/2015  . Senile purpura ([place]) 04/06/2015    Prior to Admission medications   Medication Sig Start Date End Date Taking? Authorizing Provider  aspirin 81 MG tablet Take 1 tablet by mouth daily.   Yes  Historical Provider, MD  atorvastatin (LIPITOR) 40 MG tablet Take 1 tablet (40 mg total) by mouth daily. 05/26/15  Yes Glean Hess, MD  Cyanocobalamin (VITAMIN B 12 PO) Take by mouth daily.   Yes Historical Provider, MD  Cyanocobalamin 100 MCG LOZG Take by mouth. Reported on 08/11/2015   Yes Historical Provider, MD  finasteride (PROSCAR) 5 MG tablet Take 1 tablet (5 mg total) by mouth daily. 08/11/15  Yes Shannon A McGowan, PA-C  lisinopril (PRINIVIL,ZESTRIL) 40 MG tablet Take 1 tablet (40 mg total) by mouth daily. 05/26/15  Yes Glean Hess, MD  metoprolol (LOPRESSOR) 50 MG tablet Take 1 tablet (50 mg total) by mouth 2 (two) times daily. 05/26/15  Yes Glean Hess, MD  Omega-3 Fatty Acids (FISH OIL) 1000 MG CPDR Take 1 capsule by mouth daily.   Yes Historical Provider, MD  tamsulosin (FLOMAX) 0.4 MG CAPS capsule Take 1 capsule (0.4 mg total) by mouth daily. 05/26/15  Yes Glean Hess, MD    Allergies  Allergen Reactions  . Penicillins Other (See Comments)  . Sulfa Antibiotics Other (See Comments)    Past Surgical History  Procedure Laterality Date  . Coronary artery bypass graft  2002    x3 @ [facility]  . Inguinal hernia repair Left 2011  . Renal biopsy  2014    neg in w/u renal mass/hematuria    Social History  Substance Use Topics  . Smoking status: Current Every Day Smoker  . Smokeless tobacco: None  . Alcohol Use:  No     Medication list has been reviewed and updated.   Physical Exam  Constitutional: He is oriented to person, place, and time. He appears well-developed. No distress.  HENT:  Head: Normocephalic and atraumatic.  Both ear canals occluded with cerumen  Cardiovascular: Normal rate, regular rhythm and normal heart sounds.   Pulmonary/Chest: Effort normal. No respiratory distress. He has no wheezes. Rhonchi: clear with cough.  Musculoskeletal: Normal range of motion.  Neurological: He is alert and oriented to person, place, and time.  Skin: Skin is  warm and dry. No rash noted.  Psychiatric: He has a normal mood and affect. His behavior is normal. Thought content normal.  Nursing note and vitals reviewed.   BP 122/86 mmHg  Pulse 52  Temp(Src) 97.5 F (36.4 C) (Oral)  Resp 16  Ht 6\' 1"  (1.854 m)  Wt 205 lb 9.6 oz (93.26 kg)  BMI 27.13 kg/m2  SpO2 99%  Assessment and Plan: 1. Bronchitis - doxycycline (VIBRA-TABS) 100 MG tablet; Take 1 tablet (100 mg total) by mouth 2 (two) times daily.  Dispense: 20 tablet; Refill: 0 - guaiFENesin-codeine (ROBITUSSIN AC) 100-10 MG/5ML syrup; Take 5 mLs by mouth at bedtime.  Dispense: 150 mL; Refill: 0  2. Sinus congestion - fluticasone (FLONASE) 50 MCG/ACT nasal spray; Place 2 sprays into both nostrils daily.  Dispense: 16 g; Refill: 0  3. Dyslipidemia - atorvastatin (LIPITOR) 40 MG tablet; Take 1 tablet (40 mg total) by mouth daily.  Dispense: 30 tablet; Refill: 5  4. Impaired renal function improved  5. Cerumen impaction, bilateral May need ENT to remove - currently not symptomatic   Halina Maidens, MD [facility]  08/26/2015

## 2015-09-02 ENCOUNTER — Ambulatory Visit (INDEPENDENT_AMBULATORY_CARE_PROVIDER_SITE_OTHER): Payer: Medicare Other | Admitting: Urology

## 2015-09-02 ENCOUNTER — Encounter: Payer: Self-pay | Admitting: Urology

## 2015-09-02 ENCOUNTER — Other Ambulatory Visit: Payer: Self-pay | Admitting: Urology

## 2015-09-02 VITALS — BP 215/84 | HR 57 | Ht 72.0 in | Wt 204.0 lb

## 2015-09-02 DIAGNOSIS — N2889 Other specified disorders of kidney and ureter: Secondary | ICD-10-CM | POA: Diagnosis not present

## 2015-09-02 DIAGNOSIS — N471 Phimosis: Secondary | ICD-10-CM

## 2015-09-02 DIAGNOSIS — L900 Lichen sclerosus et atrophicus: Secondary | ICD-10-CM | POA: Diagnosis not present

## 2015-09-04 NOTE — Progress Notes (Signed)
09/02/2015 3:24 PM   Arthur Bowen [birth year] [id number]  Referring provider: Glean Hess, MD [address] [place], [place] [address]  Chief Complaint  Patient presents with  . Circumcision    HPI: 75 year old male who presents today for circumcision in the setting of phimosis, recurrent balanoposthitis.    He also returns today for follow-up of CT abdomen with and without contrast for further evaluation/surveillance of complex left renal cyst which is been present since at least 2014.  At the time that this was discovered, he underwent biopsy of lesion which revealed mostly blood and hemosiderin laden macrophages consistent with hemorrhagic cyst. There was no malignant cells identified. At the time of diagnosis, the mass measured approximately 4.5 cm. A most recent imaging, it was noted to possibly increase to 5 cm maximal diameter follow-up renal ultrasound. Given the concern for growth, he underwent repeat CT abdomen with and without contrast which reveals that the lesion is essentially stable and unchanged since 2014.  He denies flank pain or gross hematuria. No weight loss.   His daughter is present today to review CT scan findings.   PMH: Past Medical History  Diagnosis Date  . Hematuria   . Renal mass, left   . BPH (benign prostatic hypertrophy)   . Gross hematuria   . Elevated PSA   . Acquired phimosis   . Hypertension     Surgical History: Past Surgical History  Procedure Laterality Date  . Coronary artery bypass graft  2002    x3 @ [place]  . Inguinal hernia repair Left 2011  . Renal biopsy  2014    neg in w/u renal mass/hematuria    Home Medications:    Medication List       This list is accurate as of: 09/02/15 11:59 PM.  Always use your most recent med list.               aspirin 81 MG tablet  Take 1 tablet by mouth daily. Reported on 09/02/2015     atorvastatin 40 MG tablet  Commonly known as:  LIPITOR  Take 1 tablet (40 mg  total) by mouth daily.     Cyanocobalamin 100 MCG Lozg  Take by mouth. Reported on 08/11/2015     VITAMIN B 12 PO  Take by mouth daily.     doxycycline 100 MG tablet  Commonly known as:  VIBRA-TABS  Take 1 tablet (100 mg total) by mouth 2 (two) times daily.     finasteride 5 MG tablet  Commonly known as:  PROSCAR  Take 1 tablet (5 mg total) by mouth daily.     Fish Oil 1000 MG Cpdr  Take 1 capsule by mouth daily.     fluticasone 50 MCG/ACT nasal spray  Commonly known as:  FLONASE  Place 2 sprays into both nostrils daily.     guaiFENesin-codeine 100-10 MG/5ML syrup  Commonly known as:  ROBITUSSIN AC  Take 5 mLs by mouth at bedtime.     lisinopril 40 MG tablet  Commonly known as:  PRINIVIL,ZESTRIL  Take 1 tablet (40 mg total) by mouth daily.     metoprolol 50 MG tablet  Commonly known as:  LOPRESSOR  Take 1 tablet (50 mg total) by mouth 2 (two) times daily.     tamsulosin 0.4 MG Caps capsule  Commonly known as:  FLOMAX  Take 1 capsule (0.4 mg total) by mouth daily.        Allergies:  Allergies  Allergen Reactions  . Penicillins Other (See Comments)  . Sulfa Antibiotics Other (See Comments)    Family History: Family History  Problem Relation Age of Onset  . Lymphoma Mother   . Lung cancer Father   . Heart disease Brother   . Hodgkin's lymphoma Mother   . Kidney disease Neg Hx   . Prostate cancer Neg Hx     Social History:  reports that he has been smoking.  He does not have any smokeless tobacco history on file. He reports that he does not drink alcohol or use illicit drugs.  Physical Exam: BP 215/84 mmHg  Pulse 57  Ht 6' (1.829 m)  Wt 204 lb (92.534 kg)  BMI 27.66 kg/m2  Constitutional:  Alert and oriented, No acute distress. HEENT: [place] AT, moist mucus membranes.  Trachea midline, no masses. Cardiovascular: No clubbing, cyanosis, or edema. Respiratory: Normal respiratory effort, no increased work of breathing. GI: Abdomen is soft, nontender,  nondistended, no abdominal masses GU: No CVA tenderness. Significant penile phimosis, unable to retract prior to circumcision procedure with severely thickened foreskin. Skin: No rashes, bruises or suspicious lesions. Lymph: No cervical or inguinal adenopathy. Neurologic: Grossly intact, no focal deficits, moving all 4 extremities. Psychiatric: Normal mood and affect.  Laboratory Data: Lab Results  Component Value Date   WBC 8.4 05/26/2015   HGB 13.5 02/21/2014   HCT 42.5 05/26/2015   MCV 91 05/26/2015   PLT 258 05/26/2015    Lab Results  Component Value Date   CREATININE 1.41* 08/11/2015    Pertinent Imaging:  Study Result     CLINICAL DATA: Follow-up renal mass.  EXAM: CT ABDOMEN WITHOUT AND WITH CONTRAST  TECHNIQUE: Multidetector CT imaging of the abdomen was performed following the standard protocol before and following the bolus administration of intravenous contrast.  CONTRAST: 66mL OMNIPAQUE IOHEXOL 350 MG/ML SOLN  COMPARISON: 08/04/2012.  FINDINGS: Lower chest: Lung bases show no acute findings. Heart size normal. No pericardial or pleural effusion.  Hepatobiliary: Liver is unremarkable. Stones are seen in the gallbladder. No biliary ductal dilatation.  Pancreas: 10 mm low-attenuation lesion in the pancreatic tail is unchanged from 07/25/2012 and therefore considered benign. Pancreas is otherwise unremarkable.  Spleen: Negative.  Adrenals/Urinary Tract: Slight nodular thickening of both adrenal glands, left greater than right. A complex lesion in the interpolar left kidney measures 3.9 x 5.0 cm, stable, no enhancement. Additional low and high density lesions in the kidneys bilaterally are most consistent with cysts, at least 1 of which is hyperdense along the posterior lower pole right kidney (2/66). No urinary stones.  Stomach/Bowel: Stomach and visualized bowel are unremarkable.  Vascular/Lymphatic: Atherosclerotic calcification  of the arterial vasculature without abdominal aortic aneurysm. No pathologically enlarged lymph nodes.  Other: Mesenteries and peritoneum are unremarkable. No free fluid.  Musculoskeletal: No worrisome lytic or sclerotic lesions. Degenerative changes are seen in the spine.  IMPRESSION: 1. Heterogeneous nonenhancing lesion in the interpolar left kidney is stable in size from 08/04/2012 and was previously biopsied on 09/15/2012. Findings are most indicative of a mildly complex/hemorrhagic/proteinaceous cyst. 2. Cholelithiasis.   Electronically Signed  By: Lorin Picket M.D.  On: 08/24/2015 11:32   CT scan personally reviewed today  Circumcision Procedure   Informed consistent obtained.  Risks discussed.  He was then prepped and draped in a sterile fashion.    Pre-Procedure: -A dorsal penile and ring block were administered using 20 cc 1% lidocaine.  This was not adequate and an additional 20 cc of  1% lidocaine was used throughout the procedure for total 40 cc of lidocaine.  The surgical site was then tested and adequate anesthesia was achieved.    Procedure: - Forceps were used to stretch the phimotic foreskin and expose the coronal area exposing the glans and meatus. -There was significant smegma noted and debris -At dorsal slit was performed first clamping the thickened, inflamed foreskin with a clamp and incising on the clamped surface -At this point, the foreskin could finally be reduced revealing an inflamed glans with significant coronal adhesions which were attempted to be taken down with blunt and sharp dissection. Most but not all of the adhesions were able to be taken down. -Two circumferential incision were made on the penile skin, one in the mid shaft and another approximately 1 cm proximal to the coronal margin using a blade.  Of note, the skin was very thickened and inflamed. It was difficult to dissect off of the phallus and care was taken to avoid any  injury to the underlying structures. -The foreskin was then removed in a sleeve-like fashion with care taken to avoid injury to the urethra.  - Bleeding points were cauterized and adequate hemostasis was achieved - Skin margins were reapproximated with interrupted 3-0 chromic catgut sutures. -A dressing of Xeroform and gauze was applied.    Post-Procedure: -RTC in 4 weeks for wound check  Assessment & Plan:    1. Renal mass CT imaging reviewed, mass size stable Previous negative biopsy reassuring Recommend follow-up ultrasound in 1 year  2. Phimosis Status post circ today Postprocedure injections reviewed Foreskin sent for pathology given very inflamed, thickened nature   Return in about 4 weeks (around 09/30/2015) for wound check.  Hollice Espy, MD  [facility] [address] [place], [place] [address] [phone]

## 2015-09-07 LAB — PATHOLOGY

## 2015-09-17 HISTORY — PX: OTHER SURGICAL HISTORY: SHX169

## 2015-10-03 ENCOUNTER — Ambulatory Visit (INDEPENDENT_AMBULATORY_CARE_PROVIDER_SITE_OTHER): Payer: Medicare Other | Admitting: Urology

## 2015-10-03 ENCOUNTER — Encounter: Payer: Self-pay | Admitting: Urology

## 2015-10-03 VITALS — BP 213/67 | HR 57 | Ht 72.0 in | Wt 201.8 lb

## 2015-10-03 DIAGNOSIS — R972 Elevated prostate specific antigen [PSA]: Secondary | ICD-10-CM | POA: Diagnosis not present

## 2015-10-03 DIAGNOSIS — N471 Phimosis: Secondary | ICD-10-CM | POA: Diagnosis not present

## 2015-10-03 DIAGNOSIS — N2889 Other specified disorders of kidney and ureter: Secondary | ICD-10-CM | POA: Diagnosis not present

## 2015-10-03 DIAGNOSIS — N401 Enlarged prostate with lower urinary tract symptoms: Secondary | ICD-10-CM

## 2015-10-03 DIAGNOSIS — Z87448 Personal history of other diseases of urinary system: Secondary | ICD-10-CM | POA: Diagnosis not present

## 2015-10-03 DIAGNOSIS — N138 Other obstructive and reflux uropathy: Secondary | ICD-10-CM

## 2015-10-03 NOTE — Progress Notes (Signed)
2:49 PM   NANCY TINCHER [birth year] [id number]  Referring provider: Glean Hess, MD [address] [place], [place] [address]  Chief Complaint  Patient presents with  . Routine Post Op    4 week follow up after circumscision    HPI: Patient is a 75 year old Caucasian male with a history of gross hematuria, left renal mass, elevated PSA and BPH with LUTS who presents today for follow up after undergoing an in office circumcision on 09/02/2015 with Dr. Erlene Quan.    History of gross hematuria Patient underwent a hematuria work up in 2014 with a CT urogram and cystoscopy for gross hematuria.  CT Urogram demonstrated multiple hypodensities in both kidneys compatible renal cysts. An abnormal slightly hyperdense mass measuring 4 x 8 cm by 4 x 4 centimeters in the mid pole of the left kidney. Cystoscopy with Dr. Elnoria Howard demonstrated a very phimosis, trilobar hypertrophy and grade 1 trabeculation.  He denies any episodes of gross hematuria.    Left renal mass Patient underwent CT-guided biopsy of the left renal mass on 09/15/2012. Biopsy result noted mainly blood and scattered hemosiderin laden macro phage is compatible with contents of a hemorrhagic cyst with no malignant cells seen in the specimen.  A repeated ultrasound in December 2014 noted no change in the left renal mass.  The patient was then lost to follow-up until he was found to have an elevated PSA with his primary care physician and referred back to [place].  An ultrasound completed on 08/08/2015 noted that the midpole left renal mass had increased from [phone] cm to [phone] cm in size.  CT abd w/wo on 08/24/2015 noted a mildly complex/hemorrhagic/proteinaceous cyst.    Elevated PSA Patient's PSA was found to be 7.6 ng/mL on 05/26/2015 at his primary care physician's office.  He does not have a family history of prostate cancer.  Repeated PSA on 08/11/2015 was 5.2 ng/mL.    BPH WITH LUTS Patient was found to have  trilobar hypertrophy and trabeculation on a cystoscopic exam in 2014.   He was having difficulty with nocturia at that time. He was prescribed finasteride and tamsulosin, but he did not take the medication because he read that they could cause cancer. He denies any dysuria, hematuria or suprapubic pain.  He also denies any recent fevers, chills, nausea or vomiting.  He does not have a family history of PCa.   He states now that he does have some weak urinary stream, nocturia and hesitancy.     PMH: Past Medical History  Diagnosis Date  . Hematuria   . Renal mass, left   . BPH (benign prostatic hypertrophy)   . Gross hematuria   . Elevated PSA   . Acquired phimosis   . Hypertension     Surgical History: Past Surgical History  Procedure Laterality Date  . Coronary artery bypass graft  2002    x3 @ [facility]  . Inguinal hernia repair Left 2011  . Renal biopsy  2014    neg in w/u renal mass/hematuria  . Circumscision  09/2015    Home Medications:    Medication List       This list is accurate as of: 10/03/15  2:49 PM.  Always use your most recent med list.               aspirin 81 MG tablet  Take 1 tablet by mouth daily. Reported on 09/02/2015     atorvastatin 40 MG tablet  Commonly known as:  LIPITOR  Take 1 tablet (40 mg total) by mouth daily.     Cyanocobalamin 100 MCG Lozg  Take by mouth. Reported on 10/03/2015     VITAMIN B 12 PO  Take by mouth daily.     doxycycline 100 MG tablet  Commonly known as:  VIBRA-TABS  Take 1 tablet (100 mg total) by mouth 2 (two) times daily.     finasteride 5 MG tablet  Commonly known as:  PROSCAR  Take 1 tablet (5 mg total) by mouth daily.     Fish Oil 1000 MG Cpdr  Take 1 capsule by mouth daily.     fluticasone 50 MCG/ACT nasal spray  Commonly known as:  FLONASE  Place 2 sprays into both nostrils daily.     guaiFENesin-codeine 100-10 MG/5ML syrup  Commonly known as:  ROBITUSSIN AC  Take 5 mLs by mouth at bedtime.      lisinopril 40 MG tablet  Commonly known as:  PRINIVIL,ZESTRIL  Take 1 tablet (40 mg total) by mouth daily.     metoprolol 50 MG tablet  Commonly known as:  LOPRESSOR  Take 1 tablet (50 mg total) by mouth 2 (two) times daily.     tamsulosin 0.4 MG Caps capsule  Commonly known as:  FLOMAX  Take 1 capsule (0.4 mg total) by mouth daily.        Allergies:  Allergies  Allergen Reactions  . Penicillins Other (See Comments)  . Sulfa Antibiotics Other (See Comments)    Family History: Family History  Problem Relation Age of Onset  . Lymphoma Mother   . Lung cancer Father   . Heart disease Brother   . Hodgkin's lymphoma Mother   . Kidney disease Neg Hx   . Prostate cancer Neg Hx     Social History:  reports that he quit smoking about 2 weeks ago. He does not have any smokeless tobacco history on file. He reports that he does not drink alcohol or use illicit drugs.  ROS: UROLOGY Frequent Urination?: No Hard to postpone urination?: No Burning/pain with urination?: No Get up at night to urinate?: No Leakage of urine?: No Urine stream starts and stops?: No Trouble starting stream?: No Do you have to strain to urinate?: No Blood in urine?: No Urinary tract infection?: No Sexually transmitted disease?: No Injury to kidneys or bladder?: No Painful intercourse?: No Weak stream?: No Erection problems?: No Penile pain?: No  Gastrointestinal Nausea?: No Vomiting?: No Indigestion/heartburn?: No Diarrhea?: No Constipation?: No  Constitutional Fever: No Night sweats?: No Weight loss?: No Fatigue?: No  Skin Skin rash/lesions?: No Itching?: No  Eyes Blurred vision?: No Double vision?: No  Ears/Nose/Throat Sore throat?: No Sinus problems?: No  Hematologic/Lymphatic Swollen glands?: No Easy bruising?: No  Cardiovascular Leg swelling?: No Chest pain?: No  Respiratory Cough?: No Shortness of breath?: No  Endocrine Excessive thirst?:  No  Musculoskeletal Back pain?: No Joint pain?: No  Neurological Headaches?: No Dizziness?: No  Psychologic Depression?: No Anxiety?: No  Physical Exam: BP 213/67 mmHg  Pulse 57  Ht 6' (1.829 m)  Wt 201 lb 12.8 oz (91.536 kg)  BMI 27.36 kg/m2  Constitutional: Well nourished. Alert and oriented, No acute distress. HEENT: [place] AT, moist mucus membranes. Trachea midline, no masses. Cardiovascular: No clubbing, cyanosis, or edema. Respiratory: Normal respiratory effort, no increased work of breathing. GI: Abdomen is soft, non tender, non distended, no abdominal masses.  GU: No CVA tenderness.  No bladder fullness or masses.  Patient is s/p circumcision one month ago.  Area is well healed.  No drainage or erythema.  Urethral meatus is patent.  No penile discharge. No penile lesions or rashes. Scrotum without lesions, cysts, rashes and/or edema.  Testicles are located scrotally bilaterally. No masses are appreciated in the testicles. Left and right epididymis are normal. Skin: No rashes, bruises or suspicious lesions. Lymph: No cervical or inguinal adenopathy. Neurologic: Grossly intact, no focal deficits, moving all 4 extremities. Psychiatric: Normal mood and affect.  Laboratory Data: Lab Results  Component Value Date   WBC 8.4 05/26/2015   HGB 13.5 02/21/2014   HCT 42.5 05/26/2015   MCV 91 05/26/2015   PLT 258 05/26/2015    Lab Results  Component Value Date   CREATININE 1.41* 08/11/2015    PSA History  5.0 ng/mL on 06/26/2013  7.6 ng/mL on 05/27/2015  5.2 ng/mL on 08/11/2015     Component Value Date/Time   CHOL 188 05/26/2015 0914   CHOL 228* 02/19/2014   HDL 34* 05/26/2015 0914   HDL 31* 02/19/2014   CHOLHDL 5.5* 05/26/2015 0914   LDLCALC 113* 05/26/2015 0914   LDLCALC 143 02/19/2014    Lab Results  Component Value Date   AST 20 05/26/2015   Lab Results  Component Value Date   ALT 22 05/26/2015      Pertinent Imaging CLINICAL DATA: Follow-up  renal mass.  EXAM: CT ABDOMEN WITHOUT AND WITH CONTRAST  TECHNIQUE: Multidetector CT imaging of the abdomen was performed following the standard protocol before and following the bolus administration of intravenous contrast.  CONTRAST: 48mL OMNIPAQUE IOHEXOL 350 MG/ML SOLN  COMPARISON: 08/04/2012.  FINDINGS: Lower chest: Lung bases show no acute findings. Heart size normal. No pericardial or pleural effusion.  Hepatobiliary: Liver is unremarkable. Stones are seen in the gallbladder. No biliary ductal dilatation.  Pancreas: 10 mm low-attenuation lesion in the pancreatic tail is unchanged from 07/25/2012 and therefore considered benign. Pancreas is otherwise unremarkable.  Spleen: Negative.  Adrenals/Urinary Tract: Slight nodular thickening of both adrenal glands, left greater than right. A complex lesion in the interpolar left kidney measures 3.9 x 5.0 cm, stable, no enhancement. Additional low and high density lesions in the kidneys bilaterally are most consistent with cysts, at least 1 of which is hyperdense along the posterior lower pole right kidney (2/66). No urinary stones.  Stomach/Bowel: Stomach and visualized bowel are unremarkable.  Vascular/Lymphatic: Atherosclerotic calcification of the arterial vasculature without abdominal aortic aneurysm. No pathologically enlarged lymph nodes.  Other: Mesenteries and peritoneum are unremarkable. No free fluid.  Musculoskeletal: No worrisome lytic or sclerotic lesions. Degenerative changes are seen in the spine.  IMPRESSION: 1. Heterogeneous nonenhancing lesion in the interpolar left kidney is stable in size from 08/04/2012 and was previously biopsied on 09/15/2012. Findings are most indicative of a mildly complex/hemorrhagic/proteinaceous cyst. 2. Cholelithiasis.   Electronically Signed  By: Lorin Picket M.D.  On: 08/24/2015 11:32   Assessment & Plan:    1. History of gross  hematuria:   Patient underwent hematuria workup in 2014 with CT urogram and cystoscopy.  He was found to have a left renal hyperdense mass which was biopsied  (see renal mass)  and trilobar hypertrophy with grade 1 trabeculation.   He has not had any recent gross hematuria.  UA will be repeated when he RTC in 3 months.    2. Left renal mass:   Patient was found to have a left hyperdense mass in his left kidney on a CT Urogram in  2014. It was biopsied and no malignancy was found. It has been shown to be increasing in size on a recent renal ultrasound.   CT of abdomen w/wo noted a mildly complex/hemorrhagic/proteinaceous cyst.  RUS in one year.    3.  Elevated PSA:   Patient was found to have an elevated PSA of 7.6 ng/mL on 05/27/2015. Recent PSA was 5.2 ng/mL on 08/11/2015.  He is RTC in 3 months for PSA and exam  4. BPH with LUTS:   Patient is to continue his tamsulosin and finasteride.  He'll return in 3 months for I PSS score and exam.    5. Phimosis:   S/P circumcision on 09/04/2015.  Doing well.  Will continue to monitor.  He will RTC in 3 months for exam.   Return in about 3 months (around 01/02/2016) for IPSS and exam.  These notes generated with voice recognition software. I apologize for typographical errors.  Zara Council, [facility] [address] [place], [place] [address] [phone]

## 2015-10-24 ENCOUNTER — Encounter: Payer: Self-pay | Admitting: Internal Medicine

## 2015-10-24 ENCOUNTER — Ambulatory Visit (INDEPENDENT_AMBULATORY_CARE_PROVIDER_SITE_OTHER): Payer: Medicare Other | Admitting: Internal Medicine

## 2015-10-24 VITALS — BP 130/80 | HR 60 | Temp 98.1°F | Ht 72.0 in | Wt 200.0 lb

## 2015-10-24 DIAGNOSIS — E785 Hyperlipidemia, unspecified: Secondary | ICD-10-CM

## 2015-10-24 DIAGNOSIS — R0981 Nasal congestion: Secondary | ICD-10-CM | POA: Diagnosis not present

## 2015-10-24 DIAGNOSIS — H6123 Impacted cerumen, bilateral: Secondary | ICD-10-CM | POA: Diagnosis not present

## 2015-10-24 MED ORDER — ATORVASTATIN CALCIUM 40 MG PO TABS: 40.0000 mg | ORAL_TABLET | Freq: Every day | ORAL | Status: DC

## 2015-10-24 MED ORDER — FLUTICASONE PROPIONATE 50 MCG/ACT NA SUSP: 2.0000 | Freq: Every day | NASAL | Status: DC

## 2015-10-24 NOTE — Progress Notes (Signed)
Date:  10/24/2015   Name:  Arthur Bowen   DOB:  1941/02/18   MRN:  [id number]   Chief Complaint: URI URI  This is a new problem. The current episode started in the past 7 days. The problem has been unchanged. There has been no fever. Associated symptoms include congestion, coughing and sinus pain. Pertinent negatives include no chest pain, ear pain, headaches, sore throat or wheezing. Associated symptoms comments: Yellow sinus discharge. Treatments tried: flonase spray. The treatment provided mild relief.  Other than the sinus drainage, he feels well.   Lab Results  Component Value Date   CREATININE 1.41* 08/11/2015   CREATININE 1.69* 05/26/2015   CREATININE 1.4* 08/20/2014     Review of Systems  Constitutional: Negative for fever, chills, fatigue and unexpected weight change.  HENT: Positive for congestion, postnasal drip and sinus pressure. Negative for ear pain, sore throat and tinnitus.   Respiratory: Positive for cough. Negative for chest tightness and wheezing.   Cardiovascular: Negative for chest pain and palpitations.  Neurological: Negative for dizziness, tremors, weakness and headaches.  Hematological: Negative for adenopathy.    Patient Active Problem List   Diagnosis Date Noted  . Cerumen impaction 08/26/2015  . Elevated PSA 08/14/2015  . Renal mass 08/14/2015  . BPH with obstruction/lower urinary tract symptoms 08/14/2015  . History of hematuria 08/14/2015  . BPH associated with nocturia 05/26/2015  . CAD in native artery 04/06/2015  . Dyslipidemia 04/06/2015  . Essential (primary) hypertension 04/06/2015  . Neuropathy ([place]) 04/06/2015  . Dry nares 04/06/2015  . Peripheral vascular disease ([place]) 04/06/2015  . Impaired renal function 04/06/2015  . Senile purpura ([place]) 04/06/2015    Prior to Admission medications   Medication Sig Start Date End Date Taking? Authorizing Provider  aspirin 81 MG tablet Take 1 tablet by mouth daily. Reported on 09/02/2015     Historical Provider, MD  atorvastatin (LIPITOR) 40 MG tablet Take 1 tablet (40 mg total) by mouth daily. 08/26/15   Glean Hess, MD  Cyanocobalamin (VITAMIN B 12 PO) Take by mouth daily.    Historical Provider, MD  Cyanocobalamin 100 MCG LOZG Take by mouth. Reported on 10/03/2015    Historical Provider, MD  doxycycline (VIBRA-TABS) 100 MG tablet Take 1 tablet (100 mg total) by mouth 2 (two) times daily. 08/26/15   Glean Hess, MD  finasteride (PROSCAR) 5 MG tablet Take 1 tablet (5 mg total) by mouth daily. 08/11/15   Larene Beach A McGowan, PA-C  fluticasone (FLONASE) 50 MCG/ACT nasal spray Place 2 sprays into both nostrils daily. 08/26/15   Glean Hess, MD  guaiFENesin-codeine [facility]) 100-10 MG/5ML syrup Take 5 mLs by mouth at bedtime. Patient not taking: Reported on 10/03/2015 08/26/15   Glean Hess, MD  lisinopril (PRINIVIL,ZESTRIL) 40 MG tablet Take 1 tablet (40 mg total) by mouth daily. 05/26/15   Glean Hess, MD  metoprolol (LOPRESSOR) 50 MG tablet Take 1 tablet (50 mg total) by mouth 2 (two) times daily. 05/26/15   Glean Hess, MD  Omega-3 Fatty Acids (FISH OIL) 1000 MG CPDR Take 1 capsule by mouth daily.    Historical Provider, MD  tamsulosin (FLOMAX) 0.4 MG CAPS capsule Take 1 capsule (0.4 mg total) by mouth daily. 05/26/15   Glean Hess, MD    Allergies  Allergen Reactions  . Penicillins Other (See Comments)  . Sulfa Antibiotics Other (See Comments)    Past Surgical History  Procedure Laterality Date  . Coronary  artery bypass graft  2002    x3 @ [facility]  . Inguinal hernia repair Left 2011  . Renal biopsy  2014    neg in w/u renal mass/hematuria  . Circumscision  09/2015    Social History  Substance Use Topics  . Smoking status: Former Smoker    Quit date: 09/17/2015  . Smokeless tobacco: None     Comment: quit two weeks   . Alcohol Use: No     Medication list has been reviewed and updated.   Physical Exam  Constitutional: He is  oriented to person, place, and time. He appears well-developed and well-nourished.  HENT:  Right Ear: External ear and ear canal normal.  Left Ear: External ear and ear canal normal.  Nose: Right sinus exhibits no maxillary sinus tenderness and no frontal sinus tenderness. Left sinus exhibits no maxillary sinus tenderness and no frontal sinus tenderness.  Mouth/Throat: Uvula is midline and mucous membranes are normal. No oral lesions. No oropharyngeal exudate or posterior oropharyngeal erythema.  Bilateral impacted cerumen  Cardiovascular: Normal rate, regular rhythm and normal heart sounds.   Pulmonary/Chest: Breath sounds normal. He has no wheezes. He has no rales.  Lymphadenopathy:    He has no cervical adenopathy.  Neurological: He is alert and oriented to person, place, and time.    BP 130/80 mmHg  Pulse 60  Temp(Src) 98.1 F (36.7 C)  Ht 6' (1.829 m)  Wt 200 lb (90.719 kg)  BMI 27.12 kg/m2  SpO2 98%  Assessment and Plan: 1. Sinus congestion Resume flonase daily - fluticasone (FLONASE) 50 MCG/ACT nasal spray; Place 2 sprays into both nostrils daily.  Dispense: 16 g; Refill: 5  2. Dyslipidemia - atorvastatin (LIPITOR) 40 MG tablet; Take 1 tablet (40 mg total) by mouth daily.  Dispense: 90 tablet; Refill: 3  3. Cerumen impaction, bilateral Recommend ENT appointment   Halina Maidens, MD [facility]  10/24/2015

## 2015-11-11 DIAGNOSIS — H6123 Impacted cerumen, bilateral: Secondary | ICD-10-CM | POA: Diagnosis not present

## 2015-11-11 DIAGNOSIS — H903 Sensorineural hearing loss, bilateral: Secondary | ICD-10-CM | POA: Diagnosis not present

## 2015-12-22 ENCOUNTER — Other Ambulatory Visit: Payer: Self-pay | Admitting: Internal Medicine

## 2016-04-04 ENCOUNTER — Encounter: Payer: Self-pay | Admitting: Internal Medicine

## 2016-04-04 ENCOUNTER — Ambulatory Visit (INDEPENDENT_AMBULATORY_CARE_PROVIDER_SITE_OTHER): Payer: Medicare Other | Admitting: Internal Medicine

## 2016-04-04 VITALS — BP 143/82 | HR 64 | Temp 97.8°F | Resp 16 | Ht 72.0 in | Wt 203.0 lb

## 2016-04-04 DIAGNOSIS — N2889 Other specified disorders of kidney and ureter: Secondary | ICD-10-CM

## 2016-04-04 DIAGNOSIS — J0100 Acute maxillary sinusitis, unspecified: Secondary | ICD-10-CM

## 2016-04-04 DIAGNOSIS — R059 Cough, unspecified: Secondary | ICD-10-CM

## 2016-04-04 DIAGNOSIS — R05 Cough: Secondary | ICD-10-CM

## 2016-04-04 MED ORDER — AZITHROMYCIN 250 MG PO TABS: ORAL_TABLET | ORAL | 0 refills | Status: DC

## 2016-04-04 MED ORDER — GUAIFENESIN-CODEINE 100-10 MG/5ML PO SYRP: 5.0000 mL | ORAL_SOLUTION | Freq: Three times a day (TID) | ORAL | 0 refills | Status: DC | PRN

## 2016-04-04 NOTE — Progress Notes (Signed)
Date:  04/04/2016   Name:  Arthur Bowen   DOB:  1940-09-05   MRN:  [id number]   Chief Complaint: Cough (2-3 weeks) Cough  This is a new problem. The current episode started 1 to 4 weeks ago. The problem has been unchanged. The cough is productive of sputum. Associated symptoms include headaches, nasal congestion, postnasal drip and rhinorrhea. Pertinent negatives include no chills, ear congestion, fever, shortness of breath or wheezing. The symptoms are aggravated by lying down. His past medical history is significant for environmental allergies. There is no history of COPD.    Review of Systems  Constitutional: Negative for chills and fever.  HENT: Positive for postnasal drip and rhinorrhea.   Respiratory: Positive for cough. Negative for shortness of breath and wheezing.   Allergic/Immunologic: Positive for environmental allergies.  Neurological: Positive for headaches.    Patient Active Problem List   Diagnosis Date Noted  . Cerumen impaction 08/26/2015  . Elevated PSA 08/14/2015  . Renal mass 08/14/2015  . BPH with obstruction/lower urinary tract symptoms 08/14/2015  . History of hematuria 08/14/2015  . CAD in native artery 04/06/2015  . Dyslipidemia 04/06/2015  . Essential (primary) hypertension 04/06/2015  . Neuropathy ([place]) 04/06/2015  . Dry nares 04/06/2015  . Peripheral vascular disease ([place]) 04/06/2015  . Impaired renal function 04/06/2015  . Senile purpura ([place]) 04/06/2015    Prior to Admission medications   Medication Sig Start Date End Date Taking? Authorizing Provider  aspirin 81 MG tablet Take 1 tablet by mouth daily. Reported on 09/02/2015   Yes Historical Provider, MD  atorvastatin (LIPITOR) 40 MG tablet Take 1 tablet (40 mg total) by mouth daily. 10/24/15  Yes Glean Hess, MD  Cyanocobalamin (VITAMIN B 12 PO) Take by mouth daily.   Yes Historical Provider, MD  Cyanocobalamin 100 MCG LOZG Take by mouth. Reported on 10/03/2015   Yes Historical Provider, MD   finasteride (PROSCAR) 5 MG tablet Take 1 tablet (5 mg total) by mouth daily. 08/11/15  Yes Shannon A McGowan, PA-C  fluticasone (FLONASE) 50 MCG/ACT nasal spray Place 2 sprays into both nostrils daily. 10/24/15  Yes Glean Hess, MD  lisinopril (PRINIVIL,ZESTRIL) 40 MG tablet TAKE ONE TABLET BY MOUTH ONCE DAILY 12/22/15  Yes Glean Hess, MD  metoprolol (LOPRESSOR) 50 MG tablet Take 1 tablet (50 mg total) by mouth 2 (two) times daily. 05/26/15  Yes Glean Hess, MD  Omega-3 Fatty Acids (FISH OIL) 1000 MG CPDR Take 1 capsule by mouth daily.   Yes Historical Provider, MD  tamsulosin (FLOMAX) 0.4 MG CAPS capsule Take 1 capsule (0.4 mg total) by mouth daily. 05/26/15  Yes Glean Hess, MD    Allergies  Allergen Reactions  . Penicillins Other (See Comments)  . Sulfa Antibiotics Other (See Comments)    Past Surgical History:  Procedure Laterality Date  . Circumscision  09/2015  . CORONARY ARTERY BYPASS GRAFT  2002   x3 @ [facility]  . INGUINAL HERNIA REPAIR Left 2011  . RENAL BIOPSY  2014   neg in w/u renal mass/hematuria    Social History  Substance Use Topics  . Smoking status: Former Smoker    Quit date: 09/17/2015  . Smokeless tobacco: Never Used     Comment: quit two weeks   . Alcohol use No     Medication list has been reviewed and updated.   Physical Exam  Constitutional: He is oriented to person, place, and time. He appears well-developed and  well-nourished.  HENT:  Right Ear: Tympanic membrane, external ear and ear canal normal. Tympanic membrane is not erythematous and not retracted.  Left Ear: Tympanic membrane, external ear and ear canal normal. Tympanic membrane is not erythematous and not retracted.  Nose: Right sinus exhibits frontal sinus tenderness. Right sinus exhibits no maxillary sinus tenderness. Left sinus exhibits frontal sinus tenderness. Left sinus exhibits no maxillary sinus tenderness.  Mouth/Throat: Uvula is midline and mucous membranes are  normal. No oral lesions. No oropharyngeal exudate or posterior oropharyngeal erythema.  Cardiovascular: Normal rate, regular rhythm and normal heart sounds.   Pulmonary/Chest: Breath sounds normal. He has no wheezes. He has no rales.  Lymphadenopathy:    He has no cervical adenopathy.  Neurological: He is alert and oriented to person, place, and time.    BP (!) 143/82   Pulse 64   Temp 97.8 F (36.6 C) (Oral)   Resp 16   Ht 6' (1.829 m)   Wt 203 lb (92.1 kg)   SpO2 97%   BMI 27.53 kg/m   Assessment and Plan: 1. Renal mass Followed by Urology  2. Subacute maxillary sinusitis Continue Flonase Begin Claritin 10 mg daily - azithromycin (ZITHROMAX Z-PAK) 250 MG tablet; Take 2 tabs on day #1 then 1 tab daily for 4 more days  Dispense: 6 each; Refill: 0  3. Cough Use only at night to help with sleep - guaiFENesin-codeine (ROBITUSSIN AC) 100-10 MG/5ML syrup; Take 5 mLs by mouth 3 (three) times daily as needed for cough.  Dispense: 150 mL; Refill: 0   Halina Maidens, MD [facility]  04/04/2016

## 2016-04-04 NOTE — Patient Instructions (Signed)
Start Claritin 10 mg (loratidine) once a day

## 2016-04-19 ENCOUNTER — Telehealth: Payer: Self-pay

## 2016-04-19 ENCOUNTER — Ambulatory Visit (INDEPENDENT_AMBULATORY_CARE_PROVIDER_SITE_OTHER): Payer: Medicare Other | Admitting: Family Medicine

## 2016-04-19 VITALS — BP 155/93 | HR 57 | Ht 72.0 in | Wt 200.0 lb

## 2016-04-19 DIAGNOSIS — R310 Gross hematuria: Secondary | ICD-10-CM

## 2016-04-19 LAB — MICROSCOPIC EXAMINATION
Bacteria, UA: NONE SEEN
EPITHELIAL CELLS (NON RENAL): NONE SEEN /HPF (ref 0–10)
WBC, UA: NONE SEEN /hpf (ref 0–?)

## 2016-04-19 LAB — URINALYSIS, COMPLETE

## 2016-04-19 LAB — BLADDER SCAN AMB NON-IMAGING: Scan Result: 64

## 2016-04-19 NOTE — Progress Notes (Signed)
Patient presents today with Gross Hematuria. He does not complain of pain today, he ad pain last week prior to seeing blood. The hematuria started today after hunting. He was on a Zpack 2 weeks ago for cold symptoms. No urological surgeries in the last 30 days. I ordered a UA and Culture and a PVR result was 64. I informed patient to continue to drink plenty of fluids and we will call him with the culture results when they return.

## 2016-04-19 NOTE — Telephone Encounter (Signed)
The pt daughter called complaining that her daddy notice blood in his urine today when he urinated. She all so stated that he was having some dysuria when he urinated on last week. The pt was seen by you in April and was suppose to f/u in July for repeat UA. I informed the pt come in for a nurse visit to give [place] a UA & culture to rule out infection.

## 2016-04-22 ENCOUNTER — Other Ambulatory Visit: Payer: Self-pay | Admitting: Urology

## 2016-04-22 LAB — CULTURE, URINE COMPREHENSIVE

## 2016-04-22 MED ORDER — CIPROFLOXACIN HCL 250 MG PO TABS
250.0000 mg | ORAL_TABLET | Freq: Two times a day (BID) | ORAL | 0 refills | Status: DC
Start: 2016-04-22 — End: 2016-05-30

## 2016-04-23 ENCOUNTER — Telehealth: Payer: Self-pay

## 2016-04-23 NOTE — Telephone Encounter (Signed)
Spoke with pt daughter, [name], in reference to cipro and needing a f/u appt. Tammy stated that she will have pt call to make appt and she will pick up cipro.

## 2016-04-23 NOTE — Telephone Encounter (Signed)
-----   Message from Nori Riis, PA-C sent at 04/22/2016  7:51 PM EST ----- Patient has a +UCx.  They need to start Cipro 250 mg,  one tablet twice daily for seven days.  They also need to take a probiotic with the antibiotic course.  The dosage is listed below:  L. acidophilus and L. casei (25 x 109 CFU/day for 2 days, then 50 x 109 CFU/day for duration of the antibiotic course)  I have e scribed their prescription to [facility] in [place].  He is also overdue for 3 month follow-up for elevated PSA. He will need to make an appointment in 2 weeks.

## 2016-04-23 NOTE — Telephone Encounter (Signed)
LMOM

## 2016-05-30 ENCOUNTER — Ambulatory Visit (INDEPENDENT_AMBULATORY_CARE_PROVIDER_SITE_OTHER): Payer: Medicare Other | Admitting: Internal Medicine

## 2016-05-30 ENCOUNTER — Encounter: Payer: Self-pay | Admitting: Internal Medicine

## 2016-05-30 VITALS — BP 162/110 | HR 88 | Temp 97.6°F | Ht 72.0 in | Wt 204.0 lb

## 2016-05-30 DIAGNOSIS — R972 Elevated prostate specific antigen [PSA]: Secondary | ICD-10-CM | POA: Diagnosis not present

## 2016-05-30 DIAGNOSIS — I739 Peripheral vascular disease, unspecified: Secondary | ICD-10-CM | POA: Diagnosis not present

## 2016-05-30 DIAGNOSIS — Z0001 Encounter for general adult medical examination with abnormal findings: Secondary | ICD-10-CM | POA: Diagnosis not present

## 2016-05-30 DIAGNOSIS — N2889 Other specified disorders of kidney and ureter: Secondary | ICD-10-CM | POA: Diagnosis not present

## 2016-05-30 DIAGNOSIS — N289 Disorder of kidney and ureter, unspecified: Secondary | ICD-10-CM | POA: Diagnosis not present

## 2016-05-30 DIAGNOSIS — D485 Neoplasm of uncertain behavior of skin: Secondary | ICD-10-CM | POA: Diagnosis not present

## 2016-05-30 DIAGNOSIS — Z0000 Encounter for general adult medical examination without abnormal findings: Secondary | ICD-10-CM

## 2016-05-30 DIAGNOSIS — E785 Hyperlipidemia, unspecified: Secondary | ICD-10-CM | POA: Diagnosis not present

## 2016-05-30 DIAGNOSIS — I10 Essential (primary) hypertension: Secondary | ICD-10-CM | POA: Diagnosis not present

## 2016-05-30 MED ORDER — AMLODIPINE BESYLATE 5 MG PO TABS: 5.0000 mg | ORAL_TABLET | Freq: Every day | ORAL | 3 refills | Status: DC

## 2016-05-30 NOTE — Progress Notes (Signed)
Patient: Arthur Bowen, Male    DOB: July 06, 1940, 75 y.o.   MRN: [id number] Visit Date: 05/30/2016  Today's Provider: Halina Maidens, MD   Chief Complaint  Patient presents with  . Medicare Wellness   Subjective:    Annual wellness visit Arthur Bowen is a 75 y.o. male who presents today for his Subsequent Annual Wellness Visit. He feels well. He reports exercising walking to the [place]. He reports he is sleeping fairly well.   ----------------------------------------------------------- Hypertension  This is a chronic problem. The problem is unchanged. Associated symptoms include shortness of breath (with extreme exertion). Pertinent negatives include no chest pain, headaches, palpitations or peripheral edema. There are no associated agents to hypertension. Past treatments include ACE inhibitors and beta blockers. The current treatment provides moderate improvement.  Hyperlipidemia  This is a chronic problem. The problem is controlled. Associated symptoms include shortness of breath (with extreme exertion). Pertinent negatives include no chest pain or myalgias. Current antihyperlipidemic treatment includes statins. The current treatment provides significant improvement of lipids.    Review of Systems  Constitutional: Negative for appetite change, chills, diaphoresis, fatigue and unexpected weight change.  HENT: Negative for hearing loss, tinnitus, trouble swallowing and voice change.   Eyes: Negative for visual disturbance.  Respiratory: Positive for shortness of breath (with extreme exertion). Negative for choking and wheezing.   Cardiovascular: Negative for chest pain, palpitations and leg swelling.  Gastrointestinal: Negative for abdominal pain, blood in stool, constipation and diarrhea.  Endocrine: Negative for polydipsia and polyuria.  Genitourinary: Positive for hematuria (worked up by Urology - treated with antibiotics). Negative for difficulty urinating, dysuria and  frequency.  Musculoskeletal: Negative for arthralgias, back pain and myalgias.  Skin: Negative for color change and rash.  Neurological: Negative for dizziness, syncope, weakness and headaches.  Hematological: Negative for adenopathy.  Psychiatric/Behavioral: Negative for dysphoric mood and sleep disturbance.    Social History   Social History  . Marital status: Divorced    Spouse name: N/A  . Number of children: N/A  . Years of education: N/A   Occupational History  . Not on file.   Social History Main Topics  . Smoking status: Current Some Day Smoker    Last attempt to quit: 09/17/2015  . Smokeless tobacco: Current User     Comment: quit two weeks   . Alcohol use No  . Drug use: No  . Sexual activity: Not on file   Other Topics Concern  . Not on file   Social History Narrative  . No narrative on file    Outpatient Encounter Prescriptions as of 05/30/2016  Medication Sig Note  . aspirin 81 MG tablet Take 1 tablet by mouth daily. Reported on 09/02/2015 04/06/2015: Received from: [facility]: Take by mouth.  Marland Kitchen atorvastatin (LIPITOR) 40 MG tablet Take 1 tablet (40 mg total) by mouth daily.   . Cyanocobalamin (VITAMIN B 12 PO) Take by mouth daily.   . Cyanocobalamin 100 MCG LOZG Take by mouth. Reported on 10/03/2015 04/06/2015: Received from: [facility]  . finasteride (PROSCAR) 5 MG tablet Take 1 tablet (5 mg total) by mouth daily.   . fluticasone (FLONASE) 50 MCG/ACT nasal spray Place 2 sprays into both nostrils daily.   Marland Kitchen lisinopril (PRINIVIL,ZESTRIL) 40 MG tablet TAKE ONE TABLET BY MOUTH ONCE DAILY   . metoprolol (LOPRESSOR) 50 MG tablet Take 1 tablet (50 mg total) by mouth 2 (two) times daily.   . Omega-3 Fatty Acids (FISH OIL)  1000 MG CPDR Take 1 capsule by mouth daily. 04/06/2015: Received from: [facility]: Take by mouth.  . tamsulosin (FLOMAX) 0.4 MG CAPS capsule Take 1 capsule (0.4 mg  total) by mouth daily.   . [DISCONTINUED] guaiFENesin-codeine (ROBITUSSIN AC) 100-10 MG/5ML syrup Take 5 mLs by mouth 3 (three) times daily as needed for cough.   . [DISCONTINUED] azithromycin (ZITHROMAX Z-PAK) 250 MG tablet Take 2 tabs on day #1 then 1 tab daily for 4 more days (Patient not taking: Reported on 05/30/2016)   . [DISCONTINUED] ciprofloxacin (CIPRO) 250 MG tablet Take 1 tablet (250 mg total) by mouth 2 (two) times daily. (Patient not taking: Reported on 05/30/2016)    No facility-administered encounter medications on file as of 05/30/2016.      Patient Active Problem List   Diagnosis Date Noted  . Cerumen impaction 08/26/2015  . Elevated PSA 08/14/2015  . Renal mass 08/14/2015  . BPH with obstruction/lower urinary tract symptoms 08/14/2015  . History of hematuria 08/14/2015  . CAD in native artery 04/06/2015  . Dyslipidemia 04/06/2015  . Essential (primary) hypertension 04/06/2015  . Neuropathy ([place]) 04/06/2015  . Dry nares 04/06/2015  . Peripheral vascular disease ([place]) 04/06/2015  . Impaired renal function 04/06/2015  . Senile purpura ([place]) 04/06/2015    Past Surgical History:  Procedure Laterality Date  . Circumscision  09/2015  . CORONARY ARTERY BYPASS GRAFT  2002   x3 @ [place]  . INGUINAL HERNIA REPAIR Left 2011  . RENAL BIOPSY  2014   neg in w/u renal mass/hematuria    His family history includes Heart disease in his brother; Hodgkin's lymphoma in his mother; Lung cancer in his father; Lymphoma in his mother.       Patient Care Team: Glean Hess, MD as PCP - General (Family Medicine) Hollice Espy, MD as Consulting Physician (Urology)      Objective:   Vitals: BP (!) 162/110   Pulse 88   Temp 97.6 F (36.4 C)   Ht 6' (1.829 m)   Wt 204 lb (92.5 kg)   SpO2 98%   BMI 27.67 kg/m   Physical Exam  Constitutional: He is oriented to person, place, and time. He appears well-developed and well-nourished.  HENT:  Head: Normocephalic.    Right Ear: Tympanic membrane, external ear and ear canal normal.  Left Ear: Tympanic membrane, external ear and ear canal normal.  Nose: Nose normal.  Mouth/Throat: Uvula is midline and oropharynx is clear and moist.  Eyes: Conjunctivae and EOM are normal. Pupils are equal, round, and reactive to light.  Neck: Normal range of motion. Neck supple. Carotid bruit is not present. No thyromegaly present.  Cardiovascular: Normal rate, regular rhythm, normal heart sounds and intact distal pulses.   Pulmonary/Chest: Effort normal and breath sounds normal. He has no wheezes. Right breast exhibits no mass. Left breast exhibits no mass.  Surgical scar healed  Abdominal: Soft. Normal appearance and bowel sounds are normal. There is no hepatosplenomegaly. There is no tenderness.  Musculoskeletal: Normal range of motion. He exhibits no edema or tenderness.  Lymphadenopathy:    He has no cervical adenopathy.  Neurological: He is alert and oriented to person, place, and time. He has normal strength and normal reflexes. Gait normal.  Skin: Skin is warm, dry and intact.     1 cm size raised warty lesion with central eschar.  Psychiatric: He has a normal mood and affect. His speech is normal and behavior is normal. Judgment  and thought content normal.  Nursing note and vitals reviewed.   Activities of Daily Living In your present state of health, do you have any difficulty performing the following activities: 05/30/2016  Hearing? N  Vision? N  Difficulty concentrating or making decisions? N  Dressing or bathing? N  Doing errands, shopping? N  Preparing Food and eating ? Y  Using the Toilet? Y  In the past six months, have you accidently leaked urine? N  Do you have problems with loss of bowel control? N  Managing your Medications? Y  Managing your Finances? Y  Housekeeping or managing your Housekeeping? Y  Some recent data might be hidden    Fall Risk Assessment Fall Risk  05/30/2016 08/26/2015  05/26/2015  Falls in the past year? No No No      Depression Screen PHQ 2/9 Scores 05/30/2016 05/26/2015  PHQ - 2 Score 0 0   6CIT Screen 05/30/2016  What Year? 0 points  What month? 0 points  What time? 0 points  Count back from 20 0 points  Months in reverse 4 points  Repeat phrase 0 points  Total Score 4      Medicare Annual Wellness Visit Summary:  Reviewed patient's Family Medical History Reviewed and updated list of patient's medical providers Assessment of cognitive impairment was done Assessed patient's functional ability Established a written schedule for health screening [place] Completed and Reviewed  Exercise Activities and Dietary recommendations Goals    . Increase physical activity          Mowing yard and walking       Immunization History  Administered Date(s) Administered  . Influenza,inj,Quad PF,36+ Mos 05/26/2015  . Pneumococcal Conjugate-13 02/19/2014  . Pneumococcal Polysaccharide-23 03/20/2012  . Tdap 03/20/2012    Health Maintenance  Topic Date Due  . ZOSTAVAX  09/09/1998  . INFLUENZA VACCINE  06/18/2016 (Originally 01/17/2016)  . TETANUS/TDAP  03/20/2022  . PNA vac Low Risk Adult  Completed    Discussed health benefits of physical activity, and encouraged him to engage in regular exercise appropriate for his age and condition.    ------------------------------------------------------------------------------------------------------------  Assessment & Plan:  1. Medicare annual wellness visit, subsequent Measures satisfied  2. Essential (primary) hypertension Not controlled - add amlodipine - CBC with Differential/Platelet - amLODipine (NORVASC) 5 MG tablet; Take 1 tablet (5 mg total) by mouth daily.  Dispense: 90 tablet; Refill: 3  3. Peripheral vascular disease ([place]) stable  4. Impaired renal function Continue to monitor - Comprehensive metabolic panel  5. Dyslipidemia Continue statin medication -  Lipid panel  6. Renal mass Work up negative - recent CT showed stable size Repeat US one year recommended  7. Elevated PSA Followed by Urology  8. Neoplasm of uncertain behavior of skin Recommend Dermatology evaluation - Ambulatory referral to Dermatology   Halina Maidens, MD [facility]  05/30/2016

## 2016-05-30 NOTE — Patient Instructions (Signed)
Start new blood pressure - Amlodipine 5 mg once a day.  Health Maintenance  Topic Date Due  . ZOSTAVAX  09/09/1998  . INFLUENZA VACCINE  06/18/2016 (Originally 01/17/2016)  . TETANUS/TDAP  03/20/2022  . PNA vac Low Risk Adult  Completed

## 2016-05-31 LAB — CBC WITH DIFFERENTIAL/PLATELET
BASOS ABS: 0 10*3/uL (ref 0.0–0.2)
BASOS: 0 %
EOS (ABSOLUTE): 0.6 10*3/uL — AB (ref 0.0–0.4)
Eos: 7 %
Hematocrit: 41.3 % (ref 37.5–51.0)
Hemoglobin: 13.9 g/dL (ref 13.0–17.7)
IMMATURE GRANULOCYTES: 0 %
Immature Grans (Abs): 0 10*3/uL (ref 0.0–0.1)
Lymphocytes Absolute: 2.6 10*3/uL (ref 0.7–3.1)
Lymphs: 29 %
MCH: 30.2 pg (ref 26.6–33.0)
MCHC: 33.7 g/dL (ref 31.5–35.7)
MCV: 90 fL (ref 79–97)
MONOS ABS: 0.5 10*3/uL (ref 0.1–0.9)
Monocytes: 6 %
NEUTROS ABS: 5.1 10*3/uL (ref 1.4–7.0)
NEUTROS PCT: 58 %
PLATELETS: 268 10*3/uL (ref 150–379)
RBC: 4.6 x10E6/uL (ref 4.14–5.80)
RDW: 14.6 % (ref 12.3–15.4)
WBC: 8.9 10*3/uL (ref 3.4–10.8)

## 2016-05-31 LAB — COMPREHENSIVE METABOLIC PANEL
A/G RATIO: 1.6 (ref 1.2–2.2)
ALT: 21 IU/L (ref 0–44)
AST: 19 IU/L (ref 0–40)
Albumin: 3.5 g/dL (ref 3.5–4.8)
Alkaline Phosphatase: 130 IU/L — ABNORMAL HIGH (ref 39–117)
BILIRUBIN TOTAL: 0.5 mg/dL (ref 0.0–1.2)
BUN/Creatinine Ratio: 15 (ref 10–24)
BUN: 23 mg/dL (ref 8–27)
CHLORIDE: 105 mmol/L (ref 96–106)
CO2: 26 mmol/L (ref 18–29)
Calcium: 9.2 mg/dL (ref 8.6–10.2)
Creatinine, Ser: 1.55 mg/dL — ABNORMAL HIGH (ref 0.76–1.27)
GFR calc Af Amer: 49 mL/min/{1.73_m2} — ABNORMAL LOW (ref >59)
GFR calc non Af Amer: 43 mL/min/{1.73_m2} — ABNORMAL LOW (ref >59)
Globulin, Total: 2.2 g/dL (ref 1.5–4.5)
Glucose: 101 mg/dL — ABNORMAL HIGH (ref 65–99)
POTASSIUM: 5.1 mmol/L (ref 3.5–5.2)
Sodium: 145 mmol/L — ABNORMAL HIGH (ref 134–144)
Total Protein: 5.7 g/dL — ABNORMAL LOW (ref 6.0–8.5)

## 2016-05-31 LAB — LIPID PANEL
CHOLESTEROL TOTAL: 206 mg/dL — AB (ref 100–199)
Chol/HDL Ratio: 6.2 ratio units — ABNORMAL HIGH (ref 0.0–5.0)
HDL: 33 mg/dL — AB (ref >39)
LDL Calculated: 119 mg/dL — ABNORMAL HIGH (ref 0–99)
TRIGLYCERIDES: 269 mg/dL — AB (ref 0–149)
VLDL Cholesterol Cal: 54 mg/dL — ABNORMAL HIGH (ref 5–40)

## 2016-06-15 ENCOUNTER — Encounter: Payer: Self-pay | Admitting: Internal Medicine

## 2016-06-15 ENCOUNTER — Ambulatory Visit (INDEPENDENT_AMBULATORY_CARE_PROVIDER_SITE_OTHER): Payer: Medicare Other | Admitting: Internal Medicine

## 2016-06-15 VITALS — BP 160/82 | HR 82 | Temp 97.6°F | Ht 72.0 in | Wt 204.0 lb

## 2016-06-15 DIAGNOSIS — J0100 Acute maxillary sinusitis, unspecified: Secondary | ICD-10-CM | POA: Diagnosis not present

## 2016-06-15 MED ORDER — LEVOFLOXACIN 500 MG PO TABS
500.0000 mg | ORAL_TABLET | Freq: Every day | ORAL | 0 refills | Status: DC
Start: 2016-06-15 — End: 2016-09-10

## 2016-06-15 MED ORDER — BENZONATATE 100 MG PO CAPS: 100.0000 mg | ORAL_CAPSULE | Freq: Three times a day (TID) | ORAL | 0 refills | Status: DC

## 2016-06-15 NOTE — Patient Instructions (Signed)
Continue tylenol cold-head

## 2016-06-15 NOTE — Progress Notes (Signed)
Date:  06/15/2016   Name:  Arthur Bowen   DOB:  Apr 29, 1941   MRN:  [id number]   Chief Complaint: Cough (Pt stated coughing, running nose for less than a week) Sinusitis  This is a new problem. The current episode started in the past 7 days. The problem is unchanged. There has been no fever. Associated symptoms include congestion, coughing and sinus pressure. Pertinent negatives include no chills, ear pain, headaches, shortness of breath or sore throat. Past treatments include acetaminophen and oral decongestants. The treatment provided mild relief.  Blowing out and coughing up thick yellow phlegm.  No shortness of breath or wheezing.  No ear pain.    Review of Systems  Constitutional: Positive for fatigue. Negative for chills and fever.  HENT: Positive for congestion and sinus pressure. Negative for ear pain, sore throat and trouble swallowing.   Respiratory: Positive for cough. Negative for chest tightness, shortness of breath and wheezing.   Cardiovascular: Negative for chest pain and palpitations.  Gastrointestinal: Negative for abdominal pain, diarrhea and nausea.  Neurological: Negative for dizziness and headaches.    Patient Active Problem List   Diagnosis Date Noted  . Cerumen impaction 08/26/2015  . Elevated PSA 08/14/2015  . Renal mass 08/14/2015  . BPH with obstruction/lower urinary tract symptoms 08/14/2015  . History of hematuria 08/14/2015  . CAD in native artery 04/06/2015  . Dyslipidemia 04/06/2015  . Essential (primary) hypertension 04/06/2015  . Neuropathy ([place]) 04/06/2015  . Dry nares 04/06/2015  . Peripheral vascular disease ([place]) 04/06/2015  . Impaired renal function 04/06/2015  . Senile purpura ([place]) 04/06/2015    Prior to Admission medications   Medication Sig Start Date End Date Taking? Authorizing Provider  amLODipine (NORVASC) 5 MG tablet Take 1 tablet (5 mg total) by mouth daily. 05/30/16  Yes Glean Hess, MD  aspirin 81 MG tablet Take 1  tablet by mouth daily. Reported on 09/02/2015   Yes Historical Provider, MD  atorvastatin (LIPITOR) 40 MG tablet Take 1 tablet (40 mg total) by mouth daily. 10/24/15  Yes Glean Hess, MD  Cyanocobalamin (VITAMIN B 12 PO) Take by mouth daily.   Yes Historical Provider, MD  Cyanocobalamin 100 MCG LOZG Take by mouth. Reported on 10/03/2015   Yes Historical Provider, MD  finasteride (PROSCAR) 5 MG tablet Take 1 tablet (5 mg total) by mouth daily. 08/11/15  Yes Shannon A McGowan, PA-C  fluticasone (FLONASE) 50 MCG/ACT nasal spray Place 2 sprays into both nostrils daily. 10/24/15  Yes Glean Hess, MD  lisinopril (PRINIVIL,ZESTRIL) 40 MG tablet TAKE ONE TABLET BY MOUTH ONCE DAILY 12/22/15  Yes Glean Hess, MD  metoprolol (LOPRESSOR) 50 MG tablet Take 1 tablet (50 mg total) by mouth 2 (two) times daily. 05/26/15  Yes Glean Hess, MD  Omega-3 Fatty Acids (FISH OIL) 1000 MG CPDR Take 1 capsule by mouth daily.   Yes Historical Provider, MD  Phenyleph-CPM-DM-APAP (TYLENOL COLD HEAD CONGESTION PO) Take by mouth.   Yes Historical Provider, MD  tamsulosin (FLOMAX) 0.4 MG CAPS capsule Take 1 capsule (0.4 mg total) by mouth daily. 05/26/15  Yes Glean Hess, MD    Allergies  Allergen Reactions  . Penicillins Other (See Comments)  . Sulfa Antibiotics Other (See Comments)    Past Surgical History:  Procedure Laterality Date  . Circumscision  09/2015  . CORONARY ARTERY BYPASS GRAFT  2002   x3 @ [place]  . INGUINAL HERNIA REPAIR Left 2011  . RENAL  BIOPSY  2014   neg in w/u renal mass/hematuria    Social History  Substance Use Topics  . Smoking status: Current Some Day Smoker    Last attempt to quit: 09/17/2015  . Smokeless tobacco: Current User     Comment: quit two weeks   . Alcohol use No     Medication list has been reviewed and updated.   Physical Exam  Constitutional: He is oriented to person, place, and time. He appears well-developed and well-nourished.  HENT:  Right Ear:  External ear and ear canal normal. Tympanic membrane is erythematous and retracted.  Left Ear: External ear and ear canal normal. Tympanic membrane is retracted. Tympanic membrane is not erythematous.  Nose: Right sinus exhibits maxillary sinus tenderness. Right sinus exhibits no frontal sinus tenderness. Left sinus exhibits maxillary sinus tenderness. Left sinus exhibits no frontal sinus tenderness.  Mouth/Throat: Uvula is midline and mucous membranes are normal. No oral lesions. Posterior oropharyngeal erythema present. No oropharyngeal exudate.  Cardiovascular: Normal rate, regular rhythm and normal heart sounds.   Pulmonary/Chest: He has decreased breath sounds. He has no wheezes. He has no rales.  Lymphadenopathy:    He has no cervical adenopathy.  Neurological: He is alert and oriented to person, place, and time.    BP (!) 160/82   Pulse 82   Temp 97.6 F (36.4 C)   Ht 6' (1.829 m)   Wt 204 lb (92.5 kg)   SpO2 96%   BMI 27.67 kg/m   Assessment and Plan: 1. Acute non-recurrent maxillary sinusitis Continue over the counter sinus medication Follow up if needed - levofloxacin (LEVAQUIN) 500 MG tablet; Take 1 tablet (500 mg total) by mouth daily.  Dispense: 7 tablet; Refill: 0 - benzonatate (TESSALON PERLES) 100 MG capsule; Take 1 capsule (100 mg total) by mouth 3 (three) times daily.  Dispense: 30 capsule; Refill: 0   Halina Maidens, MD [facility]  06/15/2016

## 2016-06-29 ENCOUNTER — Other Ambulatory Visit: Payer: Self-pay | Admitting: Internal Medicine

## 2016-06-29 DIAGNOSIS — I10 Essential (primary) hypertension: Secondary | ICD-10-CM

## 2016-07-12 DIAGNOSIS — L821 Other seborrheic keratosis: Secondary | ICD-10-CM | POA: Diagnosis not present

## 2016-09-04 ENCOUNTER — Other Ambulatory Visit: Payer: Self-pay | Admitting: Internal Medicine

## 2016-09-05 NOTE — Telephone Encounter (Signed)
pts coming in on 3/26

## 2016-09-10 ENCOUNTER — Ambulatory Visit (INDEPENDENT_AMBULATORY_CARE_PROVIDER_SITE_OTHER): Payer: Medicare Other | Admitting: Internal Medicine

## 2016-09-10 ENCOUNTER — Encounter: Payer: Self-pay | Admitting: Internal Medicine

## 2016-09-10 VITALS — BP 152/81 | HR 56 | Temp 98.2°F | Ht 72.0 in | Wt 197.0 lb

## 2016-09-10 DIAGNOSIS — I739 Peripheral vascular disease, unspecified: Secondary | ICD-10-CM | POA: Diagnosis not present

## 2016-09-10 DIAGNOSIS — I10 Essential (primary) hypertension: Secondary | ICD-10-CM

## 2016-09-10 DIAGNOSIS — D692 Other nonthrombocytopenic purpura: Secondary | ICD-10-CM

## 2016-09-10 DIAGNOSIS — J0100 Acute maxillary sinusitis, unspecified: Secondary | ICD-10-CM | POA: Diagnosis not present

## 2016-09-10 MED ORDER — FINASTERIDE 5 MG PO TABS: 5.0000 mg | ORAL_TABLET | Freq: Every day | ORAL | 3 refills | Status: DC

## 2016-09-10 MED ORDER — LEVOFLOXACIN 500 MG PO TABS: 500.0000 mg | ORAL_TABLET | Freq: Every day | ORAL | 0 refills | Status: DC

## 2016-09-10 NOTE — Progress Notes (Signed)
Date:  09/10/2016   Name:  Arthur Bowen   DOB:  07/05/1940   MRN:  [id number]   Chief Complaint: Hypertension and Sinusitis (Facial pressure. Yellow production. No fever.) Hypertension  This is a chronic problem. The problem has been gradually improving since onset. Pertinent negatives include no chest pain, headaches, palpitations or shortness of breath. There are no associated agents to hypertension. Past treatments include ACE inhibitors, beta blockers and calcium channel blockers (amlodipine added last visit). The current treatment provides moderate improvement.  Sinusitis  This is a new problem. The current episode started in the past 7 days. The problem has been gradually worsening since onset. There has been no fever. Associated symptoms include sinus pressure. Pertinent negatives include no chills, headaches, shortness of breath or sore throat.  PVD - continues to use tobacco.  No non healing ulcers or skin changes.  On aspirin daily.  Still having skin damage from minor trauma.  No uncontrolled bleeding.    Review of Systems  Constitutional: Negative for chills, fatigue and fever.  HENT: Positive for sinus pressure. Negative for sore throat.   Eyes: Negative for visual disturbance.  Respiratory: Negative for chest tightness, shortness of breath and wheezing.   Cardiovascular: Negative for chest pain, palpitations and leg swelling.  Gastrointestinal: Negative for abdominal pain.  Genitourinary: Negative for difficulty urinating, dysuria and hematuria.  Musculoskeletal: Negative for arthralgias.  Skin: Positive for color change. Negative for wound.  Neurological: Negative for dizziness and headaches.  Psychiatric/Behavioral: Negative for sleep disturbance.    Patient Active Problem List   Diagnosis Date Noted  . Cerumen impaction 08/26/2015  . Elevated PSA 08/14/2015  . Renal mass 08/14/2015  . BPH with obstruction/lower urinary tract symptoms 08/14/2015  . History of  hematuria 08/14/2015  . CAD in native artery 04/06/2015  . Dyslipidemia 04/06/2015  . Essential (primary) hypertension 04/06/2015  . Neuropathy ([place]) 04/06/2015  . Dry nares 04/06/2015  . Peripheral vascular disease ([place]) 04/06/2015  . Impaired renal function 04/06/2015  . Senile purpura ([place]) 04/06/2015    Prior to Admission medications   Medication Sig Start Date End Date Taking? Authorizing Provider  amLODipine (NORVASC) 5 MG tablet Take 1 tablet (5 mg total) by mouth daily. 05/30/16   Glean Hess, MD  aspirin 81 MG tablet Take 1 tablet by mouth daily. Reported on 09/02/2015    Historical Provider, MD  atorvastatin (LIPITOR) 40 MG tablet Take 1 tablet (40 mg total) by mouth daily. 10/24/15   Glean Hess, MD  Cyanocobalamin (VITAMIN B 12 PO) Take by mouth daily.    Historical Provider, MD  finasteride (PROSCAR) 5 MG tablet Take 1 tablet (5 mg total) by mouth daily. 08/11/15   Larene [place] A McGowan, PA-C  fluticasone (FLONASE) 50 MCG/ACT nasal spray Place 2 sprays into both nostrils daily. 10/24/15   Glean Hess, MD       Glean Hess, MD  lisinopril (PRINIVIL,ZESTRIL) 40 MG tablet TAKE ONE TABLET BY MOUTH ONCE DAILY 09/04/16   Glean Hess, MD  metoprolol (LOPRESSOR) 50 MG tablet TAKE ONE TABLET BY MOUTH TWICE DAILY 06/29/16   Glean Hess, MD  Phenyleph-CPM-DM-APAP (TYLENOL COLD HEAD CONGESTION PO) Take by mouth.    Historical Provider, MD  tamsulosin (FLOMAX) 0.4 MG CAPS capsule Take 1 capsule (0.4 mg total) by mouth daily. 05/26/15   Glean Hess, MD    Allergies  Allergen Reactions  . Penicillins Other (See Comments)  . Sulfa Antibiotics Other (  See Comments)    Past Surgical History:  Procedure Laterality Date  . Circumscision  09/2015  . CORONARY ARTERY BYPASS GRAFT  2002   x3 @ [place]  . INGUINAL HERNIA REPAIR Left 2011  . RENAL BIOPSY  2014   neg in w/u renal mass/hematuria    Social History  Substance Use Topics  . Smoking status: Current  Every Day Smoker    Types: Cigarettes  . Smokeless tobacco: Never Used  . Alcohol use No     Medication list has been reviewed and updated.   Physical Exam  Constitutional: He is oriented to person, place, and time. He appears well-developed and well-nourished. He does not have a sickly appearance. No distress.  HENT:  Right Ear: External ear and ear canal normal. Tympanic membrane is not erythematous and not retracted.  Left Ear: External ear and ear canal normal. Tympanic membrane is not erythematous and not retracted.  Nose: Right sinus exhibits maxillary sinus tenderness and frontal sinus tenderness. Left sinus exhibits maxillary sinus tenderness and frontal sinus tenderness.  Mouth/Throat: Uvula is midline and mucous membranes are normal. No oral lesions. No oropharyngeal exudate or posterior oropharyngeal erythema.  Cardiovascular: Normal rate, regular rhythm and normal heart sounds.   Pulmonary/Chest: He has wheezes (few scattered wheezes). He has no rales.  Musculoskeletal: He exhibits no edema.  Lymphadenopathy:    He has no cervical adenopathy.  Neurological: He is alert and oriented to person, place, and time.  Skin:  Multiple bruises on forearms    BP (!) 152/81   Pulse (!) 56   Temp 98.2 F (36.8 C)   Ht 6' (1.829 m)   Wt 197 lb (89.4 kg)   SpO2 97%   BMI 26.72 kg/m   Assessment and Plan: 1. Essential (primary) hypertension Borderline control on 3 meds Will continue same for now - encourage smoking cessation  2. Acute non-recurrent maxillary sinusitis - levofloxacin (LEVAQUIN) 500 MG tablet; Take 1 tablet (500 mg total) by mouth daily.  Dispense: 7 tablet; Refill: 0  3. Peripheral vascular disease ([place]) Encourage regular exercise Continue aspirin  4. Senile purpura ([place]) Stable Protective measures advised   Meds ordered this encounter  Medications  . finasteride (PROSCAR) 5 MG tablet    Sig: Take 1 tablet (5 mg total) by mouth daily.    Dispense:   90 tablet    Refill:  3  . levofloxacin (LEVAQUIN) 500 MG tablet    Sig: Take 1 tablet (500 mg total) by mouth daily.    Dispense:  7 tablet    Refill:  0    Halina Maidens, MD [facility]  09/10/2016

## 2016-10-29 ENCOUNTER — Other Ambulatory Visit: Payer: Self-pay | Admitting: Internal Medicine

## 2016-10-29 DIAGNOSIS — E785 Hyperlipidemia, unspecified: Secondary | ICD-10-CM

## 2016-12-27 ENCOUNTER — Other Ambulatory Visit: Payer: Self-pay | Admitting: Internal Medicine

## 2017-01-09 ENCOUNTER — Encounter: Payer: Self-pay | Admitting: Internal Medicine

## 2017-01-09 ENCOUNTER — Ambulatory Visit (INDEPENDENT_AMBULATORY_CARE_PROVIDER_SITE_OTHER): Payer: Medicare Other | Admitting: Internal Medicine

## 2017-01-09 VITALS — BP 128/80 | HR 58 | Ht 72.0 in | Wt 205.0 lb

## 2017-01-09 DIAGNOSIS — N289 Disorder of kidney and ureter, unspecified: Secondary | ICD-10-CM | POA: Diagnosis not present

## 2017-01-09 DIAGNOSIS — M1711 Unilateral primary osteoarthritis, right knee: Secondary | ICD-10-CM | POA: Diagnosis not present

## 2017-01-09 DIAGNOSIS — I10 Essential (primary) hypertension: Secondary | ICD-10-CM

## 2017-01-09 DIAGNOSIS — E785 Hyperlipidemia, unspecified: Secondary | ICD-10-CM | POA: Diagnosis not present

## 2017-01-09 MED ORDER — LISINOPRIL 40 MG PO TABS: 40.0000 mg | ORAL_TABLET | Freq: Every day | ORAL | 3 refills | Status: DC

## 2017-01-09 NOTE — Patient Instructions (Signed)
Take tylenol for knee pain (acetaminophen) - read the directions on the bottle for dosing

## 2017-01-09 NOTE — Progress Notes (Signed)
Date:  01/09/2017   Name:  Arthur Bowen   DOB:  [birth year]   MRN:  [id number]   Chief Complaint: Hypertension Hypertension  This is a chronic problem. The problem is controlled. Pertinent negatives include no chest pain, headaches, palpitations or shortness of breath. Past treatments include beta blockers, calcium channel blockers and ACE inhibitors. The current treatment provides significant improvement. Hypertensive end-organ damage includes kidney disease.  Knee Pain   There was no injury mechanism. The pain is present in the right knee. The quality of the pain is described as aching. The pain is mild. The pain has been fluctuating since onset.  Hyperlipidemia  This is a chronic problem. The problem is controlled. Pertinent negatives include no chest pain or shortness of breath. Current antihyperlipidemic treatment includes statins.  Renal insuff - discussed avoiding nsaids and staying hydrated.  Patient feels well.      Review of Systems  Constitutional: Negative for chills, fatigue and fever.  Eyes: Negative for visual disturbance.  Respiratory: Negative for chest tightness, shortness of breath and wheezing.   Cardiovascular: Negative for chest pain, palpitations and leg swelling.  Gastrointestinal: Negative for abdominal pain.  Genitourinary: Negative for difficulty urinating.  Musculoskeletal: Positive for arthralgias (right knee pain).  Neurological: Negative for dizziness, weakness and headaches.  Psychiatric/Behavioral: Negative for dysphoric mood and sleep disturbance.    Patient Active Problem List   Diagnosis Date Noted  . Elevated PSA 08/14/2015  . Renal mass 08/14/2015  . BPH with obstruction/lower urinary tract symptoms 08/14/2015  . History of hematuria 08/14/2015  . CAD in native artery 04/06/2015  . Dyslipidemia 04/06/2015  . Essential (primary) hypertension 04/06/2015  . Neuropathy 04/06/2015  . Peripheral vascular disease ([place]) 04/06/2015  . Impaired  renal function 04/06/2015  . Senile purpura ([place]) 04/06/2015    Prior to Admission medications   Medication Sig Start Date End Date Taking? Authorizing Provider  amLODipine (NORVASC) 5 MG tablet Take 1 tablet (5 mg total) by mouth daily. 05/30/16  Yes Glean Hess, MD  aspirin 81 MG tablet Take 1 tablet by mouth daily. Reported on 09/02/2015   Yes [provider]  atorvastatin (LIPITOR) 40 MG tablet TAKE ONE TABLET BY MOUTH ONCE DAILY 10/29/16  Yes Glean Hess, MD  Cyanocobalamin (VITAMIN B 12 PO) Take by mouth daily.   Yes [provider]  finasteride (PROSCAR) 5 MG tablet Take 1 tablet (5 mg total) by mouth daily. 09/10/16  Yes Glean Hess, MD  fluticasone [facility]) 50 MCG/ACT nasal spray Place 2 sprays into both nostrils daily. 10/24/15  Yes Glean Hess, MD  lisinopril (PRINIVIL,ZESTRIL) 40 MG tablet TAKE 1 TABLET BY MOUTH ONCE DAILY 12/27/16  Yes Glean Hess, MD  metoprolol (LOPRESSOR) 50 MG tablet TAKE ONE TABLET BY MOUTH TWICE DAILY 06/29/16  Yes Glean Hess, MD  tamsulosin (FLOMAX) 0.4 MG CAPS capsule Take 1 capsule (0.4 mg total) by mouth daily. 05/26/15  Yes Glean Hess, MD    Allergies  Allergen Reactions  . Penicillins Other (See Comments)  . Sulfa Antibiotics Other (See Comments)    Past Surgical History:  Procedure Laterality Date  . Circumscision  09/2015  . CORONARY ARTERY BYPASS GRAFT  2002   x3 @ [place]  . INGUINAL HERNIA REPAIR Left 2011  . RENAL BIOPSY  2014   neg in w/u renal mass/hematuria    Social History  Substance Use Topics  . Smoking status: Current Every Day Smoker  Types: Cigarettes  . Smokeless tobacco: Never Used  . Alcohol use No     Medication list has been reviewed and updated.   Physical Exam  Constitutional: He is oriented to person, place, and time. He appears well-developed. No distress.  HENT:  Head: Normocephalic and atraumatic.  Neck: Normal range of motion. Neck supple.    Cardiovascular: Normal rate, regular rhythm and normal heart sounds.   Pulmonary/Chest: Effort normal and breath sounds normal. No respiratory distress. He has no wheezes.  Musculoskeletal: He exhibits no edema.       Right knee: He exhibits effusion. He exhibits normal range of motion and no erythema.       Left knee: Normal.  Lymphadenopathy:    He has no cervical adenopathy.  Neurological: He is alert and oriented to person, place, and time.  Skin: Skin is warm and dry. No rash noted.  Psychiatric: He has a normal mood and affect. His behavior is normal. Thought content normal.  Nursing note and vitals reviewed.   BP 128/80   Pulse (!) 58   Ht 6' (1.829 m)   Wt 205 lb (93 kg)   SpO2 98%   BMI 27.80 kg/m   Assessment and Plan: 1. Essential (primary) hypertension controlled  2. Impaired renal function - Basic metabolic panel Lab Results  Component Value Date   CREATININE 1.55 (H) 05/30/2016   BUN 23 05/30/2016   NA 145 (H) 05/30/2016   K 5.1 05/30/2016   CL 105 05/30/2016   CO2 26 05/30/2016    3. Dyslipidemia On statin therapy Lab Results  Component Value Date   CHOL 206 (H) 05/30/2016   HDL 33 (L) 05/30/2016   LDLCALC 119 (H) 05/30/2016   TRIG 269 (H) 05/30/2016   CHOLHDL 6.2 (H) 05/30/2016     4. Primary osteoarthritis of right knee Recommend Tylenol as needed   Meds ordered this encounter  Medications  . lisinopril (PRINIVIL,ZESTRIL) 40 MG tablet    Sig: Take 1 tablet (40 mg total) by mouth daily.    Dispense:  90 tablet    Refill:  Jewell, MD [facility]  01/09/2017

## 2017-01-10 ENCOUNTER — Other Ambulatory Visit: Payer: Self-pay | Admitting: Internal Medicine

## 2017-01-10 DIAGNOSIS — I129 Hypertensive chronic kidney disease with stage 1 through stage 4 chronic kidney disease, or unspecified chronic kidney disease: Secondary | ICD-10-CM

## 2017-01-10 DIAGNOSIS — N184 Chronic kidney disease, stage 4 (severe): Principal | ICD-10-CM

## 2017-01-10 LAB — BASIC METABOLIC PANEL
BUN / CREAT RATIO: 14 (ref 10–24)
BUN: 25 mg/dL (ref 8–27)
CHLORIDE: 105 mmol/L (ref 96–106)
CO2: 23 mmol/L (ref 20–29)
Calcium: 9 mg/dL (ref 8.6–10.2)
Creatinine, Ser: 1.82 mg/dL — ABNORMAL HIGH (ref 0.76–1.27)
GFR calc non Af Amer: 35 mL/min/{1.73_m2} — ABNORMAL LOW (ref >59)
GFR, EST AFRICAN AMERICAN: 40 mL/min/{1.73_m2} — AB (ref >59)
Glucose: 86 mg/dL (ref 65–99)
POTASSIUM: 5 mmol/L (ref 3.5–5.2)
Sodium: 142 mmol/L (ref 134–144)

## 2017-03-21 DIAGNOSIS — I10 Essential (primary) hypertension: Secondary | ICD-10-CM | POA: Diagnosis not present

## 2017-03-21 DIAGNOSIS — R809 Proteinuria, unspecified: Secondary | ICD-10-CM | POA: Diagnosis not present

## 2017-03-21 DIAGNOSIS — N183 Chronic kidney disease, stage 3 (moderate): Secondary | ICD-10-CM | POA: Diagnosis not present

## 2017-04-02 DIAGNOSIS — N183 Chronic kidney disease, stage 3 (moderate): Secondary | ICD-10-CM | POA: Diagnosis not present

## 2017-04-23 DIAGNOSIS — N2581 Secondary hyperparathyroidism of renal origin: Secondary | ICD-10-CM | POA: Diagnosis not present

## 2017-04-23 DIAGNOSIS — N183 Chronic kidney disease, stage 3 (moderate): Secondary | ICD-10-CM | POA: Diagnosis not present

## 2017-04-23 DIAGNOSIS — D631 Anemia in chronic kidney disease: Secondary | ICD-10-CM | POA: Diagnosis not present

## 2017-04-23 DIAGNOSIS — I10 Essential (primary) hypertension: Secondary | ICD-10-CM | POA: Diagnosis not present

## 2017-05-07 DIAGNOSIS — N183 Chronic kidney disease, stage 3 (moderate): Secondary | ICD-10-CM | POA: Diagnosis not present

## 2017-05-16 DIAGNOSIS — H524 Presbyopia: Secondary | ICD-10-CM | POA: Diagnosis not present

## 2017-05-27 ENCOUNTER — Ambulatory Visit: Payer: Medicare Other

## 2017-06-03 ENCOUNTER — Ambulatory Visit: Payer: Medicare Other

## 2017-06-03 ENCOUNTER — Ambulatory Visit (INDEPENDENT_AMBULATORY_CARE_PROVIDER_SITE_OTHER): Payer: Medicare Other | Admitting: Internal Medicine

## 2017-06-03 ENCOUNTER — Encounter: Payer: Self-pay | Admitting: Internal Medicine

## 2017-06-03 VITALS — BP 138/80 | HR 48 | Ht 72.0 in | Wt 200.0 lb

## 2017-06-03 DIAGNOSIS — I739 Peripheral vascular disease, unspecified: Secondary | ICD-10-CM

## 2017-06-03 DIAGNOSIS — I10 Essential (primary) hypertension: Secondary | ICD-10-CM | POA: Diagnosis not present

## 2017-06-03 DIAGNOSIS — N184 Chronic kidney disease, stage 4 (severe): Secondary | ICD-10-CM | POA: Diagnosis not present

## 2017-06-03 DIAGNOSIS — Z0000 Encounter for general adult medical examination without abnormal findings: Secondary | ICD-10-CM | POA: Diagnosis not present

## 2017-06-03 DIAGNOSIS — I129 Hypertensive chronic kidney disease with stage 1 through stage 4 chronic kidney disease, or unspecified chronic kidney disease: Secondary | ICD-10-CM | POA: Diagnosis not present

## 2017-06-03 DIAGNOSIS — I2510 Atherosclerotic heart disease of native coronary artery without angina pectoris: Secondary | ICD-10-CM

## 2017-06-03 DIAGNOSIS — E785 Hyperlipidemia, unspecified: Secondary | ICD-10-CM | POA: Diagnosis not present

## 2017-06-03 LAB — POCT URINALYSIS DIPSTICK
BILIRUBIN UA: NEGATIVE
KETONES UA: NEGATIVE
LEUKOCYTES UA: NEGATIVE
Nitrite, UA: NEGATIVE
SPEC GRAV UA: 1.02 (ref 1.010–1.025)
Urobilinogen, UA: 0.2 E.U./dL
pH, UA: 6 (ref 5.0–8.0)

## 2017-06-03 MED ORDER — METOPROLOL TARTRATE 50 MG PO TABS: 50.0000 mg | ORAL_TABLET | Freq: Two times a day (BID) | ORAL | 3 refills | Status: DC

## 2017-06-03 MED ORDER — AMLODIPINE BESYLATE 5 MG PO TABS: 5.0000 mg | ORAL_TABLET | Freq: Every day | ORAL | 3 refills | Status: DC

## 2017-06-03 NOTE — Progress Notes (Signed)
Date:  06/03/2017   Name:  Arthur Bowen   DOB:  02/07/1941   MRN:  [id number]   Chief Complaint: Annual Exam Arthur Bowen is a 76 y.o. male who presents today for his Complete Annual Exam. He feels fairly well. He reports exercising walking. He reports he is sleeping well.   Hyperlipidemia  This is a chronic problem. The problem is controlled. Pertinent negatives include no chest pain, myalgias or shortness of breath. Current antihyperlipidemic treatment includes statins. The current treatment provides significant improvement of lipids.  Hypertension  This is a chronic problem. The problem is controlled. Pertinent negatives include no chest pain, headaches, palpitations or shortness of breath. Past treatments include calcium channel blockers, beta blockers and ACE inhibitors. There are no compliance problems.   Benign Prostatic Hypertrophy  This is a chronic problem. Irritative symptoms do not include frequency. Pertinent negatives include no chills or dysuria. Past treatments include tamsulosin. The treatment provided moderate (followed by Dr. Erlene Quan) relief.  Elevated PSA - followed by Urology.  Renal mass that is stable.  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  No medication changes were recommended.  Review of Systems  Constitutional: Negative for appetite change, chills, diaphoresis, fatigue and unexpected weight change.  HENT: Negative for hearing loss, tinnitus, trouble swallowing and voice change.   Eyes: Negative for visual disturbance.  Respiratory: Negative for choking, shortness of breath and wheezing.   Cardiovascular: Negative for chest pain, palpitations and leg swelling.  Gastrointestinal: Negative for abdominal pain, blood in stool, constipation and diarrhea.  Genitourinary: Negative for difficulty urinating, dysuria and frequency.  Musculoskeletal: Negative for arthralgias, back pain and myalgias.  Skin: Negative for  color change and rash.  Neurological: Negative for dizziness, syncope and headaches.  Hematological: Negative for adenopathy.  Psychiatric/Behavioral: Negative for dysphoric mood and sleep disturbance.    Patient Active Problem List   Diagnosis Date Noted  . Primary osteoarthritis of right knee 01/09/2017  . Elevated PSA 08/14/2015  . Renal mass 08/14/2015  . BPH with obstruction/lower urinary tract symptoms 08/14/2015  . History of hematuria 08/14/2015  . CAD in native artery 04/06/2015  . Dyslipidemia 04/06/2015  . Essential (primary) hypertension 04/06/2015  . Neuropathy 04/06/2015  . Peripheral vascular disease ([place]) 04/06/2015  . 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 Senile purpura ([place]) 04/06/2015    Prior to Admission medications   Medication Sig Start Date End Date Taking? Authorizing Provider  amLODipine (NORVASC) 5 MG tablet Take 1 tablet (5 mg total) by mouth daily. 05/30/16   Glean Hess, MD  aspirin 81 MG tablet Take 1 tablet by mouth daily. Reported on 09/02/2015    [provider]  atorvastatin (LIPITOR) 40 MG tablet TAKE ONE TABLET BY MOUTH ONCE DAILY 10/29/16   Glean Hess, MD  Cyanocobalamin (VITAMIN B 12 PO) Take by mouth daily.    [provider]  finasteride (PROSCAR) 5 MG tablet Take 1 tablet (5 mg total) by mouth daily. 09/10/16   Glean Hess, MD  fluticasone (FLONASE) 50 MCG/ACT nasal spray Place 2 sprays into both nostrils daily. 10/24/15   Glean Hess, MD  lisinopril (PRINIVIL,ZESTRIL) 40 MG tablet Take 1 tablet (40 mg total) by mouth daily. 01/09/17   Glean Hess, MD  metoprolol (LOPRESSOR) 50 MG tablet TAKE ONE TABLET BY MOUTH TWICE DAILY 06/29/16   Glean Hess, MD  tamsulosin (FLOMAX) 0.4 MG  CAPS capsule Take 1 capsule (0.4 mg total) by mouth daily. 05/26/15   Glean Hess, MD    Allergies  Allergen Reactions  . Penicillins Other (See Comments)  . Sulfa Antibiotics Other (See  Comments)    Past Surgical History:  Procedure Laterality Date  . Circumscision  09/2015  . CORONARY ARTERY BYPASS GRAFT  2002   x3 @ [place]  . INGUINAL HERNIA REPAIR Left 2011  . RENAL BIOPSY  2014   neg in w/u renal mass/hematuria    Social History   Tobacco Use  . Smoking status: Current Every Day Smoker    Types: Cigarettes  . Smokeless tobacco: Never Used  Substance Use Topics  . Alcohol use: No    Alcohol/week: 0.0 oz  . Drug use: No     Medication list has been reviewed and updated.  PHQ 2/9 Scores 05/30/2016 05/26/2015  PHQ - 2 Score 0 0    Physical Exam  Constitutional: He is oriented to person, place, and time. He appears well-developed and well-nourished.  HENT:  Head: Normocephalic.  Right Ear: Tympanic membrane, external ear and ear canal normal.  Left Ear: Tympanic membrane, external ear and ear canal normal.  Nose: Nose normal.  Mouth/Throat: Uvula is midline and oropharynx is clear and moist.  Eyes: Conjunctivae and EOM are normal. Pupils are equal, round, and reactive to light.  Neck: Normal range of motion. Neck supple. Carotid bruit is not present. No thyromegaly present.  Cardiovascular: Normal rate, regular rhythm, S1 normal, normal heart sounds and intact distal pulses. Exam reveals no gallop.  No murmur heard. Pulses:      Dorsalis pedis pulses are 1+ on the right side, and 1+ on the left side.       Posterior tibial pulses are 0 on the right side, and 0 on the left side.  Pulmonary/Chest: Effort normal and breath sounds normal. He has no wheezes. Right breast exhibits no mass. Left breast exhibits no mass.  Abdominal: Soft. Normal appearance and bowel sounds are normal. There is no hepatosplenomegaly. There is no tenderness.  Musculoskeletal: Normal range of motion. He exhibits no edema or tenderness.  Lymphadenopathy:    He has no cervical adenopathy.  Neurological: He is alert and oriented to person, place, and time. He has normal  reflexes.  Skin: Skin is warm, dry and intact.  Psychiatric: He has a normal mood and affect. His speech is normal and behavior is normal. Judgment and thought content normal.  Nursing note and vitals reviewed.   BP 138/80   Pulse (!) 48   Ht 6' (1.829 m)   Wt 200 lb (90.7 kg)   SpO2 99%   BMI 27.12 kg/m   Assessment and Plan: 1. Annual physical exam Pt declined MAW today  2. CKD stage 4 secondary to hypertension (HCC) Stable UA with protein, blood and glucose - Comprehensive metabolic panel  3. Essential (primary) hypertension controlled - metoprolol tartrate (LOPRESSOR) 50 MG tablet; Take 1 tablet (50 mg total) by mouth 2 (two) times daily.  Dispense: 180 tablet; Refill: 3 - amLODipine (NORVASC) 5 MG tablet; Take 1 tablet (5 mg total) by mouth daily.  Dispense: 90 tablet; Refill: 3 - CBC with Differential/Platelet - TSH - POCT urinalysis dipstick  4. CAD in native artery Asx; no longer being followed by Cardiology  5. Peripheral vascular disease ([place]) stable  6. Dyslipidemia On statin therapy - Lipid panel   Meds ordered this encounter  Medications  . metoprolol tartrate (  LOPRESSOR) 50 MG tablet    Sig: Take 1 tablet (50 mg total) by mouth 2 (two) times daily.    Dispense:  180 tablet    Refill:  3  . amLODipine (NORVASC) 5 MG tablet    Sig: Take 1 tablet (5 mg total) by mouth daily.    Dispense:  90 tablet    Refill:  3    Partially dictated using Editor, commissioning. Any errors are unintentional.  Halina Maidens, MD [facility]  06/03/2017

## 2017-06-04 LAB — COMPREHENSIVE METABOLIC PANEL
ALBUMIN: 3.5 g/dL (ref 3.5–4.8)
ALK PHOS: 151 IU/L — AB (ref 39–117)
ALT: 14 IU/L (ref 0–44)
AST: 16 IU/L (ref 0–40)
Albumin/Globulin Ratio: 1.7 (ref 1.2–2.2)
BUN / CREAT RATIO: 14 (ref 10–24)
BUN: 25 mg/dL (ref 8–27)
Bilirubin Total: 0.3 mg/dL (ref 0.0–1.2)
CALCIUM: 8.6 mg/dL (ref 8.6–10.2)
CO2: 20 mmol/L (ref 20–29)
CREATININE: 1.76 mg/dL — AB (ref 0.76–1.27)
Chloride: 108 mmol/L — ABNORMAL HIGH (ref 96–106)
GFR calc Af Amer: 42 mL/min/{1.73_m2} — ABNORMAL LOW (ref >59)
GFR, EST NON AFRICAN AMERICAN: 36 mL/min/{1.73_m2} — AB (ref >59)
GLUCOSE: 79 mg/dL (ref 65–99)
Globulin, Total: 2.1 g/dL (ref 1.5–4.5)
Potassium: 5.1 mmol/L (ref 3.5–5.2)
Sodium: 144 mmol/L (ref 134–144)
TOTAL PROTEIN: 5.6 g/dL — AB (ref 6.0–8.5)

## 2017-06-04 LAB — CBC WITH DIFFERENTIAL/PLATELET
BASOS ABS: 0 10*3/uL (ref 0.0–0.2)
Basos: 0 %
EOS (ABSOLUTE): 1 10*3/uL — ABNORMAL HIGH (ref 0.0–0.4)
EOS: 12 %
HEMATOCRIT: 41.8 % (ref 37.5–51.0)
Hemoglobin: 13.5 g/dL (ref 13.0–17.7)
IMMATURE GRANS (ABS): 0 10*3/uL (ref 0.0–0.1)
IMMATURE GRANULOCYTES: 0 %
LYMPHS: 33 %
Lymphocytes Absolute: 2.8 10*3/uL (ref 0.7–3.1)
MCH: 29.8 pg (ref 26.6–33.0)
MCHC: 32.3 g/dL (ref 31.5–35.7)
MCV: 92 fL (ref 79–97)
MONOCYTES: 5 %
Monocytes Absolute: 0.4 10*3/uL (ref 0.1–0.9)
NEUTROS PCT: 50 %
Neutrophils Absolute: 4.5 10*3/uL (ref 1.4–7.0)
Platelets: 330 10*3/uL (ref 150–379)
RBC: 4.53 x10E6/uL (ref 4.14–5.80)
RDW: 14.7 % (ref 12.3–15.4)
WBC: 8.7 10*3/uL (ref 3.4–10.8)

## 2017-06-04 LAB — LIPID PANEL
CHOLESTEROL TOTAL: 185 mg/dL (ref 100–199)
Chol/HDL Ratio: 5.4 ratio — ABNORMAL HIGH (ref 0.0–5.0)
HDL: 34 mg/dL — ABNORMAL LOW (ref >39)
LDL CALC: 111 mg/dL — AB (ref 0–99)
Triglycerides: 198 mg/dL — ABNORMAL HIGH (ref 0–149)
VLDL Cholesterol Cal: 40 mg/dL (ref 5–40)

## 2017-06-04 LAB — TSH: TSH: 3.37 u[IU]/mL (ref 0.450–4.500)

## 2017-06-06 ENCOUNTER — Ambulatory Visit: Payer: Medicare Other

## 2017-07-15 DIAGNOSIS — N2581 Secondary hyperparathyroidism of renal origin: Secondary | ICD-10-CM | POA: Diagnosis not present

## 2017-07-15 DIAGNOSIS — R809 Proteinuria, unspecified: Secondary | ICD-10-CM | POA: Diagnosis not present

## 2017-07-15 DIAGNOSIS — D631 Anemia in chronic kidney disease: Secondary | ICD-10-CM | POA: Diagnosis not present

## 2017-07-15 DIAGNOSIS — I10 Essential (primary) hypertension: Secondary | ICD-10-CM | POA: Diagnosis not present

## 2017-07-15 DIAGNOSIS — N183 Chronic kidney disease, stage 3 (moderate): Secondary | ICD-10-CM | POA: Diagnosis not present

## 2017-07-16 ENCOUNTER — Telehealth: Payer: Self-pay

## 2017-07-16 NOTE — Telephone Encounter (Signed)
Patient daughter informed.

## 2017-07-16 NOTE — Telephone Encounter (Signed)
He has high protein from kidney disease that is not explained by his age or hypertension.  The high calcium is a result of the kidney disease, not the cause.   There is no other blood tests to be done.  A kidney biopsy is the only way to find out exactly what is the cause of the kidney failure AND potentially be able to treat him correctly.

## 2017-07-16 NOTE — Telephone Encounter (Signed)
Patient's daughter called stating the patient has been seeing a kidney doctor for the last few months. Just had blood work done and it came back he has high levels of calcium in his blood. They want to do a biopsy on his kidneys. He is wanted blood work from you before he will have this done. Wants to see if anything else could be causing his calcium to be elevated before he has to have a biopsy. Can we do this at a [facility]?  Please Advise.

## 2017-07-19 ENCOUNTER — Ambulatory Visit
Admission: RE | Admit: 2017-07-19 | Discharge: 2017-07-19 | Disposition: A | Discharge status: Home / Self Care | Payer: Self-pay | Source: Ambulatory Visit | Attending: Internal Medicine | Admitting: Internal Medicine

## 2017-07-19 ENCOUNTER — Other Ambulatory Visit: Payer: Self-pay | Admitting: Internal Medicine

## 2017-07-19 DIAGNOSIS — N23 Unspecified renal colic: Secondary | ICD-10-CM

## 2017-07-24 ENCOUNTER — Other Ambulatory Visit: Payer: Self-pay | Admitting: Nephrology

## 2017-07-24 DIAGNOSIS — R809 Proteinuria, unspecified: Secondary | ICD-10-CM

## 2017-07-29 ENCOUNTER — Other Ambulatory Visit: Payer: Self-pay | Admitting: *Deleted

## 2017-07-29 ENCOUNTER — Other Ambulatory Visit
Admission: RE | Admit: 2017-07-29 | Discharge: 2017-07-29 | Disposition: A | Discharge status: Home / Self Care | Payer: Medicare Other | Source: Ambulatory Visit | Attending: Internal Medicine | Admitting: Internal Medicine

## 2017-07-29 DIAGNOSIS — Z01818 Encounter for other preprocedural examination: Secondary | ICD-10-CM | POA: Diagnosis not present

## 2017-07-29 DIAGNOSIS — R809 Proteinuria, unspecified: Secondary | ICD-10-CM

## 2017-07-29 LAB — URINALYSIS, ROUTINE W REFLEX MICROSCOPIC
BACTERIA UA: NONE SEEN
BILIRUBIN URINE: NEGATIVE
GLUCOSE, UA: 50 mg/dL — AB
Hgb urine dipstick: NEGATIVE
KETONES UR: NEGATIVE mg/dL
LEUKOCYTES UA: NEGATIVE
NITRITE: NEGATIVE
Specific Gravity, Urine: 1.017 (ref 1.005–1.030)
pH: 5 (ref 5.0–8.0)

## 2017-07-29 LAB — COMPREHENSIVE METABOLIC PANEL
ALBUMIN: 2.9 g/dL — AB (ref 3.5–5.0)
ALT: 16 U/L — ABNORMAL LOW (ref 17–63)
AST: 19 U/L (ref 15–41)
Alkaline Phosphatase: 124 U/L (ref 38–126)
Anion gap: 8 (ref 5–15)
BILIRUBIN TOTAL: 0.6 mg/dL (ref 0.3–1.2)
BUN: 34 mg/dL — AB (ref 6–20)
CHLORIDE: 108 mmol/L (ref 101–111)
CO2: 22 mmol/L (ref 22–32)
Calcium: 8.7 mg/dL — ABNORMAL LOW (ref 8.9–10.3)
Creatinine, Ser: 1.96 mg/dL — ABNORMAL HIGH (ref 0.61–1.24)
GFR calc Af Amer: 36 mL/min — ABNORMAL LOW (ref >60)
GFR calc non Af Amer: 31 mL/min — ABNORMAL LOW (ref >60)
GLUCOSE: 103 mg/dL — AB (ref 65–99)
POTASSIUM: 4.4 mmol/L (ref 3.5–5.1)
Sodium: 138 mmol/L (ref 135–145)
TOTAL PROTEIN: 6.2 g/dL — AB (ref 6.5–8.1)

## 2017-07-29 LAB — PROTEIN / CREATININE RATIO, URINE
Creatinine, Urine: 114 mg/dL
PROTEIN CREATININE RATIO: 9.06 mg/mg{creat} — AB (ref 0.00–0.15)
Total Protein, Urine: 1033 mg/dL

## 2017-07-29 LAB — CBC
HEMATOCRIT: 41.6 % (ref 40.0–52.0)
Hemoglobin: 13.8 g/dL (ref 13.0–18.0)
MCH: 30.5 pg (ref 26.0–34.0)
MCHC: 33.2 g/dL (ref 32.0–36.0)
MCV: 91.9 fL (ref 80.0–100.0)
Platelets: 264 10*3/uL (ref 150–440)
RBC: 4.53 MIL/uL (ref 4.40–5.90)
RDW: 15 % — AB (ref 11.5–14.5)
WBC: 7.9 10*3/uL (ref 3.8–10.6)

## 2017-07-29 LAB — TYPE AND SCREEN
ABO/RH(D): O POS
Antibody Screen: NEGATIVE

## 2017-07-29 LAB — PROTIME-INR
INR: 0.87
Prothrombin Time: 11.8 seconds (ref 11.4–15.2)

## 2017-07-30 ENCOUNTER — Other Ambulatory Visit: Payer: Self-pay | Admitting: Radiology

## 2017-07-30 ENCOUNTER — Other Ambulatory Visit: Payer: Self-pay | Admitting: Physician Assistant

## 2017-07-31 ENCOUNTER — Observation Stay
Admission: RE | Admit: 2017-07-31 | Discharge: 2017-08-01 | Disposition: A | Discharge status: Home / Self Care | Payer: Medicare Other | Source: Ambulatory Visit | Attending: Internal Medicine | Admitting: Internal Medicine

## 2017-07-31 ENCOUNTER — Other Ambulatory Visit: Payer: Self-pay

## 2017-07-31 DIAGNOSIS — Z882 Allergy status to sulfonamides status: Secondary | ICD-10-CM | POA: Insufficient documentation

## 2017-07-31 DIAGNOSIS — I129 Hypertensive chronic kidney disease with stage 1 through stage 4 chronic kidney disease, or unspecified chronic kidney disease: Secondary | ICD-10-CM | POA: Insufficient documentation

## 2017-07-31 DIAGNOSIS — I10 Essential (primary) hypertension: Secondary | ICD-10-CM | POA: Diagnosis not present

## 2017-07-31 DIAGNOSIS — I252 Old myocardial infarction: Secondary | ICD-10-CM | POA: Diagnosis not present

## 2017-07-31 DIAGNOSIS — Z951 Presence of aortocoronary bypass graft: Secondary | ICD-10-CM | POA: Diagnosis not present

## 2017-07-31 DIAGNOSIS — Z79899 Other long term (current) drug therapy: Secondary | ICD-10-CM | POA: Insufficient documentation

## 2017-07-31 DIAGNOSIS — E785 Hyperlipidemia, unspecified: Secondary | ICD-10-CM | POA: Insufficient documentation

## 2017-07-31 DIAGNOSIS — N400 Enlarged prostate without lower urinary tract symptoms: Secondary | ICD-10-CM | POA: Insufficient documentation

## 2017-07-31 DIAGNOSIS — N281 Cyst of kidney, acquired: Secondary | ICD-10-CM | POA: Diagnosis not present

## 2017-07-31 DIAGNOSIS — N183 Chronic kidney disease, stage 3 (moderate): Secondary | ICD-10-CM | POA: Insufficient documentation

## 2017-07-31 DIAGNOSIS — Z880 Allergy status to penicillin: Secondary | ICD-10-CM | POA: Insufficient documentation

## 2017-07-31 DIAGNOSIS — N189 Chronic kidney disease, unspecified: Secondary | ICD-10-CM | POA: Diagnosis not present

## 2017-07-31 DIAGNOSIS — R809 Proteinuria, unspecified: Secondary | ICD-10-CM | POA: Diagnosis not present

## 2017-07-31 DIAGNOSIS — F17210 Nicotine dependence, cigarettes, uncomplicated: Secondary | ICD-10-CM | POA: Insufficient documentation

## 2017-07-31 DIAGNOSIS — Z7982 Long term (current) use of aspirin: Secondary | ICD-10-CM | POA: Diagnosis not present

## 2017-07-31 HISTORY — DX: Dyspnea, unspecified: R06.00

## 2017-07-31 HISTORY — DX: Acute myocardial infarction, unspecified: I21.9

## 2017-07-31 HISTORY — DX: Personal history of other diseases of the digestive system: Z87.19

## 2017-07-31 HISTORY — DX: Chronic kidney disease, unspecified: N18.9

## 2017-07-31 MED ORDER — HYDROCODONE-ACETAMINOPHEN 5-325 MG PO TABS: 1.0000 | ORAL_TABLET | ORAL | Status: DC | PRN

## 2017-07-31 MED ORDER — FINASTERIDE 5 MG PO TABS
5.0000 mg | ORAL_TABLET | Freq: Every day | ORAL | Status: DC
  Administered 2017-07-31 – 2017-08-01 (×2): 5 mg via ORAL
  Filled 2017-07-31 (×2): qty 1

## 2017-07-31 MED ORDER — HYDRALAZINE HCL 20 MG/ML IJ SOLN
INTRAMUSCULAR | Status: AC | PRN
  Administered 2017-07-31 (×2): 10 mg via INTRAVENOUS

## 2017-07-31 MED ORDER — MIDAZOLAM HCL 5 MG/5ML IJ SOLN
INTRAMUSCULAR | Status: AC | PRN
  Administered 2017-07-31: 1 mg via INTRAVENOUS

## 2017-07-31 MED ORDER — AMLODIPINE BESYLATE 5 MG PO TABS
5.0000 mg | ORAL_TABLET | Freq: Every day | ORAL | Status: DC
  Administered 2017-08-01: 5 mg via ORAL
  Filled 2017-07-31: qty 1

## 2017-07-31 MED ORDER — VITAMIN B-12 100 MCG PO TABS
100.0000 ug | ORAL_TABLET | Freq: Every day | ORAL | Status: DC
  Administered 2017-07-31 – 2017-08-01 (×2): 100 ug via ORAL
  Filled 2017-07-31 (×2): qty 1

## 2017-07-31 MED ORDER — FENTANYL CITRATE (PF) 100 MCG/2ML IJ SOLN
INTRAMUSCULAR | Status: AC | PRN
  Administered 2017-07-31: 50 ug via INTRAVENOUS

## 2017-07-31 MED ORDER — TAMSULOSIN HCL 0.4 MG PO CAPS
0.4000 mg | ORAL_CAPSULE | Freq: Every day | ORAL | Status: DC
  Administered 2017-07-31 – 2017-08-01 (×2): 0.4 mg via ORAL
  Filled 2017-07-31 (×2): qty 1

## 2017-07-31 MED ORDER — DOCUSATE SODIUM 100 MG PO CAPS: 100.0000 mg | ORAL_CAPSULE | Freq: Two times a day (BID) | ORAL | Status: DC | PRN

## 2017-07-31 MED ORDER — MIDAZOLAM HCL 5 MG/5ML IJ SOLN
INTRAMUSCULAR | Status: AC
  Filled 2017-07-31: qty 5

## 2017-07-31 MED ORDER — LISINOPRIL 20 MG PO TABS
40.0000 mg | ORAL_TABLET | Freq: Every day | ORAL | Status: DC
  Administered 2017-08-01: 40 mg via ORAL
  Filled 2017-07-31: qty 2

## 2017-07-31 MED ORDER — HYDRALAZINE HCL 20 MG/ML IJ SOLN
10.0000 mg | Freq: Four times a day (QID) | INTRAMUSCULAR | Status: DC | PRN
  Filled 2017-07-31: qty 0.5

## 2017-07-31 MED ORDER — ASPIRIN EC 81 MG PO TBEC: 81.0000 mg | DELAYED_RELEASE_TABLET | Freq: Every day | ORAL | Status: DC

## 2017-07-31 MED ORDER — ATORVASTATIN CALCIUM 20 MG PO TABS
40.0000 mg | ORAL_TABLET | Freq: Every day | ORAL | Status: DC
  Administered 2017-07-31: 40 mg via ORAL
  Filled 2017-07-31: qty 2

## 2017-07-31 MED ORDER — METOPROLOL TARTRATE 50 MG PO TABS
50.0000 mg | ORAL_TABLET | Freq: Two times a day (BID) | ORAL | Status: DC
  Administered 2017-07-31 – 2017-08-01 (×2): 50 mg via ORAL
  Filled 2017-07-31 (×2): qty 1

## 2017-07-31 MED ORDER — FENTANYL CITRATE (PF) 100 MCG/2ML IJ SOLN
INTRAMUSCULAR | Status: AC
  Filled 2017-07-31: qty 4

## 2017-07-31 MED ORDER — SODIUM CHLORIDE 0.9 % IV SOLN
INTRAVENOUS | Status: DC
  Administered 2017-07-31: 09:00:00 via INTRAVENOUS

## 2017-07-31 MED ORDER — FLUTICASONE PROPIONATE 50 MCG/ACT NA SUSP
2.0000 | Freq: Every day | NASAL | Status: DC
  Administered 2017-08-01: 2 via NASAL
  Filled 2017-07-31 (×2): qty 16

## 2017-07-31 MED ORDER — HYDRALAZINE HCL 20 MG/ML IJ SOLN
INTRAMUSCULAR | Status: AC
  Filled 2017-07-31: qty 1

## 2017-07-31 NOTE — Consult Note (Signed)
Chief Complaint: Patient was seen in consultation today for an image guided random renal biopsy at the request of [place]  Referring Physician(s): Lateef,Munsoor  Patient Status: [facility] - Out-pt  History of Present Illness: Arthur Bowen is a 77 y.o. male with multiple medical problems including chronic kidney disease, hypertension, hyperlipidemia and coronary artery disease. History of CABG procedure in 2002. A renal biopsy is requested due to evaluate the chronic kidney disease and proteinuria. Patient has no complaints today. He took his normal oral blood pressure medications this morning. Occasional shortness of breath. Denies chest pain. No GI or GU symptoms. He denies hematuria. Patient is resting comfortably without distress. Patient lives with his daughter and is a current smoker.  Past Medical History:  Diagnosis Date  . Acquired phimosis   . BPH (benign prostatic hypertrophy)   . Dyspnea    with exertion  . Elevated PSA   . Gross hematuria   . Hematuria   . History of hiatal hernia   . Hypertension   . 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 Renal mass, left     Past Surgical History:  Procedure Laterality Date  . Circumscision  09/2015  . CORONARY ARTERY BYPASS GRAFT  2002   x3 @ [place]  . INGUINAL HERNIA REPAIR Left 2011  . RENAL BIOPSY  2014   neg in w/u renal mass/hematuria    Allergies: Penicillins and Sulfa antibiotics  Medications: Prior to Admission medications   Medication Sig Start Date End Date Taking? Authorizing Provider  amLODipine (NORVASC) 5 MG tablet Take 1 tablet (5 mg total) by mouth daily. 06/03/17  Yes Glean Hess, MD  aspirin 81 MG tablet Take 1 tablet by mouth daily. Reported on 09/02/2015   Yes [provider]  atorvastatin (LIPITOR) 40 MG tablet TAKE ONE TABLET BY MOUTH ONCE DAILY 10/29/16  Yes Glean Hess, MD  Cyanocobalamin (VITAMIN B 12 PO) Take by mouth daily.   Yes [provider]    finasteride (PROSCAR) 5 MG tablet Take 1 tablet (5 mg total) by mouth daily. 09/10/16  Yes Glean Hess, MD  fluticasone [facility]) 50 MCG/ACT nasal spray Place 2 sprays into both nostrils daily. 10/24/15  Yes Glean Hess, MD  lisinopril (PRINIVIL,ZESTRIL) 40 MG tablet Take 1 tablet (40 mg total) by mouth daily. 01/09/17  Yes Glean Hess, MD  metoprolol tartrate (LOPRESSOR) 50 MG tablet Take 1 tablet (50 mg total) by mouth 2 (two) times daily. 06/03/17  Yes Glean Hess, MD  tamsulosin (FLOMAX) 0.4 MG CAPS capsule Take 1 capsule (0.4 mg total) by mouth daily. 05/26/15  Yes Glean Hess, MD     Family History  Problem Relation Age of Onset  . Lymphoma Mother   . Hodgkin's lymphoma Mother   . Lung cancer Father   . Heart disease Brother   . Kidney disease Neg Hx   . Prostate cancer Neg Hx     Social History   Socioeconomic History  . Marital status: Divorced    Spouse name: None  . Number of children: 2  . Years of education: None  . Highest education level: None  Social Needs  . Financial resource strain: Patient refused  . Food insecurity - worry: Patient refused  . Food insecurity - inability: Patient refused  . Transportation needs - medical: Patient refused  . Transportation needs - non-medical: Patient refused  Occupational History  . None  Tobacco Use  . Smoking  status: Current Every Day Smoker    Packs/day: 0.50    Types: Cigarettes  . Smokeless tobacco: Never Used  Substance and Sexual Activity  . Alcohol use: No    Alcohol/week: 0.0 oz  . Drug use: No  . Sexual activity: None  Other Topics Concern  . None  Social History Narrative   Patient refused all questions- 06/03/2017     Review of Systems: A 12 point ROS discussed and pertinent positives are indicated in the HPI above.  All other systems are negative.  Review of Systems  Constitutional: Negative.   Respiratory: Positive for shortness of breath.   Cardiovascular: Negative.    Gastrointestinal: Negative.   Genitourinary: Negative.     Vital Signs: BP (!) 201/85   Pulse (!) 46   Temp (!) 97.5 F (36.4 C) (Oral)   Resp (!) 22   Ht 6' (1.829 m)   Wt 205 lb (93 kg)   SpO2 100%   BMI 27.80 kg/m   Physical Exam  Constitutional: No distress.  HENT:  Mouth/Throat: Oropharynx is clear and moist.  Cardiovascular: Normal rate, regular rhythm and normal heart sounds.  Pulmonary/Chest: Effort normal. He has wheezes.  Abdominal: Soft. Bowel sounds are normal.  Vitals reviewed.   Imaging: No results found.  Labs:  CBC: Recent Labs    06/03/17 0909 07/29/17 0855  WBC 8.7 7.9  HGB 13.5 13.8  HCT 41.8 41.6  PLT 330 264    COAGS: Recent Labs    07/29/17 0855  INR 0.87    BMP: Recent Labs    01/09/17 0823 06/03/17 0909 07/29/17 0855  NA 142 144 138  K 5.0 5.1 4.4  CL 105 108* 108  CO2 23 20 22   GLUCOSE 86 79 103*  BUN 25 25 34*  CALCIUM 9.0 8.6 8.7*  CREATININE 1.82* 1.76* 1.96*  GFRNONAA 35* 36* 31*  GFRAA 40* 42* 36*    LIVER FUNCTION TESTS: Recent Labs    06/03/17 0909 07/29/17 0855  BILITOT 0.3 0.6  AST 16 19  ALT 14 16*  ALKPHOS 151* 124  PROT 5.6* 6.2*  ALBUMIN 3.5 2.9*    TUMOR MARKERS: No results for input(s): AFPTM, CEA, CA199, CHROMGRNA in the last 8760 hours.  Assessment and Plan:  77 year old with chronic kidney disease, proteinuria and hypertension. Patient presents for a image guided random renal biopsy. Despite taking his normal blood pressure medications, the patient is hypertensive this morning. I discussed the patient's blood pressure with Dr. Holley Raring. We will try to decrease the blood pressure with IV medications in order to perform a safe renal biopsy. Following the renal biopsy, we will plan to keep the patient overnight for observation and blood pressure control. I discussed the ultrasound guided renal biopsy with the patient and daughter in depth. They understand the risks the procedure including  bleeding. They also understand that elevated blood pressure can increase the risk of bleeding associated with the renal biopsy. After discussing the risks and benefits of the procedure, informed consent was obtained from the patient. Plan for ultrasound-guided random renal biopsy with moderate sedation.  Thank you for this interesting consult.  I greatly enjoyed meeting Arthur Bowen and look forward to participating in their care.  A copy of this report was sent to the requesting provider on this date.  Electronically Signed: Burman Riis, MD 07/31/2017, 8:48 AM   I spent a total of  15 Minutes   in face to face in clinical consultation, greater  than 50% of which was counseling/coordinating care for a renal biopsy.

## 2017-07-31 NOTE — Care Management Obs Status (Signed)
[place] NOTIFICATION   Patient Details  Name: Arthur Bowen MRN: [id number] Date of Birth: 1940-07-04   Medicare Observation Status Notification Given:  Yes    Beverly Sessions, RN 07/31/2017, 4:12 PM

## 2017-07-31 NOTE — H&P (Signed)
[facility] NAME: Arthur Bowen    MR#:  [id number]  DATE OF BIRTH:  [birth year]  DATE OF ADMISSION:  07/31/2017  PRIMARY CARE PHYSICIAN: Glean Hess, MD   REQUESTING/REFERRING PHYSICIAN: Holley Raring  CHIEF COMPLAINT:  No chief complaint on file.   HISTORY OF PRESENT ILLNESS: Arthur Bowen  is a 77 y.o. male with a known history of BPH, Hematuria, Proteinuria, CKD, was scheduled to have renal biopsy by radiologist, noted to have high BP so suggested to have observation in hospital tonight after the biopsy. Pt have no complains.  PAST MEDICAL HISTORY:   Past Medical History:  Diagnosis Date  . Acquired phimosis   . BPH (benign prostatic hypertrophy)   . Chronic kidney disease   . Dyspnea    with exertion  . Elevated PSA   . Gross hematuria   . Hematuria   . History of hiatal hernia   . Hypertension   . 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 Renal mass, left     PAST SURGICAL HISTORY:  Past Surgical History:  Procedure Laterality Date  . Circumscision  09/2015  . CORONARY ARTERY BYPASS GRAFT  2002   x3 @ [place]  . INGUINAL HERNIA REPAIR Left 2011  . RENAL BIOPSY  2014   neg in w/u renal mass/hematuria    SOCIAL HISTORY:  Social History   Tobacco Use  . Smoking status: Current Every Day Smoker    Packs/day: 0.50    Types: Cigarettes  . Smokeless tobacco: Never Used  Substance Use Topics  . Alcohol use: No    Alcohol/week: 0.0 oz    FAMILY HISTORY:  Family History  Problem Relation Age of Onset  . Lymphoma Mother   . Hodgkin's lymphoma Mother   . Lung cancer Father   . Heart disease Brother   . Kidney disease Neg Hx   . Prostate cancer Neg Hx     DRUG ALLERGIES:  Allergies  Allergen Reactions  . Penicillins Other (See Comments)  . Sulfa Antibiotics Other (See Comments)    REVIEW OF SYSTEMS:   CONSTITUTIONAL: No fever, fatigue or weakness.  EYES: No blurred or double vision.  EARS, NOSE,  AND THROAT: No tinnitus or ear pain.  RESPIRATORY: No cough, shortness of breath, wheezing or hemoptysis.  CARDIOVASCULAR: No chest pain, orthopnea, edema.  GASTROINTESTINAL: No nausea, vomiting, diarrhea or abdominal pain.  GENITOURINARY: No dysuria, hematuria.  ENDOCRINE: No polyuria, nocturia,  HEMATOLOGY: No anemia, easy bruising or bleeding SKIN: No rash or lesion. MUSCULOSKELETAL: No joint pain or arthritis.   NEUROLOGIC: No tingling, numbness, weakness.  PSYCHIATRY: No anxiety or depression.   MEDICATIONS AT HOME:  Prior to Admission medications   Medication Sig Start Date End Date Taking? Authorizing Provider  amLODipine (NORVASC) 5 MG tablet Take 1 tablet (5 mg total) by mouth daily. 06/03/17  Yes Glean Hess, MD  aspirin 81 MG tablet Take 1 tablet by mouth daily. Reported on 09/02/2015   Yes [provider]  atorvastatin (LIPITOR) 40 MG tablet TAKE ONE TABLET BY MOUTH ONCE DAILY 10/29/16  Yes Glean Hess, MD  Cyanocobalamin (VITAMIN B 12 PO) Take by mouth daily.   Yes [provider]  finasteride (PROSCAR) 5 MG tablet Take 1 tablet (5 mg total) by mouth daily. 09/10/16  Yes Glean Hess, MD  fluticasone [facility]) 50 MCG/ACT nasal spray Place 2 sprays into both nostrils daily. 10/24/15  Yes Army Melia,  Jesse Sans, MD  lisinopril (PRINIVIL,ZESTRIL) 40 MG tablet Take 1 tablet (40 mg total) by mouth daily. 01/09/17  Yes Glean Hess, MD  metoprolol tartrate (LOPRESSOR) 50 MG tablet Take 1 tablet (50 mg total) by mouth 2 (two) times daily. 06/03/17  Yes Glean Hess, MD  tamsulosin (FLOMAX) 0.4 MG CAPS capsule Take 1 capsule (0.4 mg total) by mouth daily. 05/26/15  Yes Glean Hess, MD      PHYSICAL EXAMINATION:   VITAL SIGNS: Blood pressure 139/64, pulse (!) 54, temperature 98.7 F (37.1 C), temperature source Oral, resp. rate (!) 24, height 6' (1.829 m), weight 93 kg (205 lb), SpO2 100 %.  GENERAL:  77 y.o.-year-old patient lying in the bed  with no acute distress.  EYES: Pupils equal, round, reactive to light and accommodation. No scleral icterus. Extraocular muscles intact.  HEENT: Head atraumatic, normocephalic. Oropharynx and nasopharynx clear.  NECK:  Supple, no jugular venous distention. No thyroid enlargement, no tenderness.  LUNGS: Normal breath sounds bilaterally, no wheezing, rales,rhonchi or crepitation. No use of accessory muscles of respiration.  CARDIOVASCULAR: S1, S2 normal. No murmurs, rubs, or gallops.  ABDOMEN: Soft, nontender, nondistended. Bowel sounds present. No organomegaly or mass.  EXTREMITIES: No pedal edema, cyanosis, or clubbing.  NEUROLOGIC: Cranial nerves II through XII are intact. Muscle strength 5/5 in all extremities. Sensation intact. Gait not checked.  PSYCHIATRIC: The patient is alert and oriented x 3.  SKIN: No obvious rash, lesion, or ulcer.   LABORATORY PANEL:   CBC Recent Labs  Lab 07/29/17 0855  WBC 7.9  HGB 13.8  HCT 41.6  PLT 264  MCV 91.9  MCH 30.5  MCHC 33.2  RDW 15.0*   ------------------------------------------------------------------------------------------------------------------  Chemistries  Recent Labs  Lab 07/29/17 0855  NA 138  K 4.4  CL 108  CO2 22  GLUCOSE 103*  BUN 34*  CREATININE 1.96*  CALCIUM 8.7*  AST 19  ALT 16*  ALKPHOS 124  BILITOT 0.6   ------------------------------------------------------------------------------------------------------------------ estimated creatinine clearance is 34.1 mL/min (A) (by C-G formula based on SCr of 1.96 mg/dL (H)). ------------------------------------------------------------------------------------------------------------------ No results for input(s): TSH, T4TOTAL, T3FREE, THYROIDAB in the last 72 hours.  Invalid input(s): FREET3   Coagulation profile Recent Labs  Lab 07/29/17 0855  INR 0.87    ------------------------------------------------------------------------------------------------------------------- No results for input(s): DDIMER in the last 72 hours. -------------------------------------------------------------------------------------------------------------------  Cardiac Enzymes No results for input(s): CKMB, TROPONINI, MYOGLOBIN in the last 168 hours.  Invalid input(s): CK ------------------------------------------------------------------------------------------------------------------ Invalid input(s): POCBNP  ---------------------------------------------------------------------------------------------------------------  Urinalysis    Component Value Date/Time   COLORURINE YELLOW (A) 07/29/2017 0859   APPEARANCEUR HAZY (A) 07/29/2017 0859   APPEARANCEUR Turbid (A) 04/19/2016 1003   LABSPEC 1.017 07/29/2017 0859   PHURINE 5.0 07/29/2017 0859   GLUCOSEU 50 (A) 07/29/2017 0859   HGBUR NEGATIVE 07/29/2017 0859   BILIRUBINUR NEGATIVE 07/29/2017 0859   BILIRUBINUR neg 06/03/2017 0859   KETONESUR NEGATIVE 07/29/2017 0859   PROTEINUR >=300 (A) 07/29/2017 0859   UROBILINOGEN 0.2 06/03/2017 0859   NITRITE NEGATIVE 07/29/2017 0859   LEUKOCYTESUR NEGATIVE 07/29/2017 0859     RADIOLOGY: US Biopsy (kidney)  Result Date: 07/31/2017 INDICATION: 77 year old with proteinuria and hypertension. Chronic kidney disease. EXAM: ULTRASOUND-GUIDED LEFT RENAL BIOPSY MEDICATIONS: None. ANESTHESIA/SEDATION: Moderate (conscious) sedation was employed during this procedure. A total of Versed 1.0 mg and Fentanyl 50 mcg was administered intravenously. Moderate Sedation Time: 11 minutes minutes. The patient's level of consciousness and vital signs were monitored continuously by radiology nursing throughout the procedure  under my direct supervision. Total of 20 mg of hydralazine IV was given prior to the procedure. FLUOROSCOPY TIME:  None COMPLICATIONS: None immediate. PROCEDURE: The  patient presented to the hospital hypertensive with systolic blood pressures greater than 200. Findings were discussed with the referring physician Dr. Holley Raring. We planned to decrease the blood pressure with IV medications and plan for overnight observation and blood pressure control. Informed written consent was obtained from the patient after a thorough discussion of the procedural risks, benefits and alternatives. All questions were addressed. A timeout was performed prior to the initiation of the procedure. The patient was placed prone on the ultrasound table. The left kidney lower pole was easily visualized. The left flank was prepped with chlorhexidine and sterile field was created. Skin and soft tissues were anesthetized with 1% lidocaine. Using ultrasound guidance, 16 gauge core needle was directed into the left kidney lower pole and a core biopsy was obtained. A second core biopsy was obtained from the lower pole using ultrasound guidance. Two adequate specimens were obtained and placed on a Telfa pad. Bandage placed over the puncture site. FINDINGS: Renal cysts. Core biopsies obtained from the left kidney lower pole. No bleeding or hematoma formation following the core biopsies. IMPRESSION: Successful ultrasound-guided core biopsies from the left kidney lower pole. Electronically Signed   By: Markus Daft M.D.   On: 07/31/2017 11:06    EKG: No orders found for this or any previous visit.  IMPRESSION AND PLAN:  * Uncontrolled Htn  During procedure Hydralazine inj was given, BP stable now.   COnt home meds and monitor.   Keep Hydralazine inj as needed.  * CKD with proteinuria   Follow biopsy report with nephrology.  * Hyperlipidemia   Cont atorvastatin.  * Active smoking   Nicotine patch, Counselled to quit smoking for 4 min.  * Hx of CABG   Hold aspirin for now after Biopsy today.  All the records are reviewed and case discussed with ED provider. Management plans discussed with the  patient, family and they are in agreement.  CODE STATUS: full.    Code Status Orders  (From admission, onward)        Start     Ordered   07/31/17 1019  Full code  Continuous     07/31/17 1018    Code Status History    Date Active Date Inactive Code Status Order ID Comments User Context   This patient has a current code status but no historical code status.       TOTAL TIME TAKING CARE OF THIS PATIENT: 45 minutes.    Vaughan Basta M.D on 07/31/2017   Between 7am to 6pm - Pager - [phone]  After 6pm go to [email or web address] - password [email or web address] [facility]  Office  [phone]  CC: Primary care physician; Glean Hess, MD   Note: This dictation was prepared with Dragon dictation along with smaller phrase technology. Any transcriptional errors that result from this process are unintentional.

## 2017-07-31 NOTE — Progress Notes (Signed)
Per Dr. Holley Raring discontinue order for aspirin as pt had renal biopsy today and needs to be held for several days.

## 2017-07-31 NOTE — Procedures (Signed)
  Pre-operative Diagnosis: CKD and proteinuria and HTN       Post-operative Diagnosis: CKD and proteinuria and HTN   Indications: Request for renal biopsy to evaluate kidney disease   Procedure: US guided renal biopsy   Findings: Patient was hypertensive with SBPs over 200 mmHg.  Discussed with Dr. Holley Raring and planned to give IV HTN meds for procedure and keep overnight for observation and BP control.  Gave 10 mg Hydralazine x 2 and moderate sedation in order to get SBP less than 180 mmHg.  2 cores obtained from left kidney lower pole.  Adequate cores obtained and no evidence for bleeding by [place].  Complications:None     EBL: Minimal  Plan: Bedrest today.  Will consult Hospitalist Service for 24 hours observation and BP control.

## 2017-08-01 DIAGNOSIS — N049 Nephrotic syndrome with unspecified morphologic changes: Secondary | ICD-10-CM | POA: Diagnosis not present

## 2017-08-01 DIAGNOSIS — I129 Hypertensive chronic kidney disease with stage 1 through stage 4 chronic kidney disease, or unspecified chronic kidney disease: Secondary | ICD-10-CM | POA: Diagnosis not present

## 2017-08-01 DIAGNOSIS — Z951 Presence of aortocoronary bypass graft: Secondary | ICD-10-CM | POA: Diagnosis not present

## 2017-08-01 DIAGNOSIS — I10 Essential (primary) hypertension: Secondary | ICD-10-CM | POA: Diagnosis not present

## 2017-08-01 DIAGNOSIS — Z79899 Other long term (current) drug therapy: Secondary | ICD-10-CM | POA: Diagnosis not present

## 2017-08-01 DIAGNOSIS — N183 Chronic kidney disease, stage 3 (moderate): Secondary | ICD-10-CM | POA: Diagnosis not present

## 2017-08-01 DIAGNOSIS — Z7982 Long term (current) use of aspirin: Secondary | ICD-10-CM | POA: Diagnosis not present

## 2017-08-01 DIAGNOSIS — N281 Cyst of kidney, acquired: Secondary | ICD-10-CM | POA: Diagnosis not present

## 2017-08-01 DIAGNOSIS — N400 Enlarged prostate without lower urinary tract symptoms: Secondary | ICD-10-CM | POA: Diagnosis not present

## 2017-08-01 DIAGNOSIS — I252 Old myocardial infarction: Secondary | ICD-10-CM | POA: Diagnosis not present

## 2017-08-01 DIAGNOSIS — Z880 Allergy status to penicillin: Secondary | ICD-10-CM | POA: Diagnosis not present

## 2017-08-01 DIAGNOSIS — E785 Hyperlipidemia, unspecified: Secondary | ICD-10-CM | POA: Diagnosis not present

## 2017-08-01 DIAGNOSIS — Z882 Allergy status to sulfonamides status: Secondary | ICD-10-CM | POA: Diagnosis not present

## 2017-08-01 DIAGNOSIS — R809 Proteinuria, unspecified: Secondary | ICD-10-CM | POA: Diagnosis not present

## 2017-08-01 LAB — CBC
HCT: 37.3 % — ABNORMAL LOW (ref 40.0–52.0)
Hemoglobin: 12.5 g/dL — ABNORMAL LOW (ref 13.0–18.0)
MCH: 30.6 pg (ref 26.0–34.0)
MCHC: 33.5 g/dL (ref 32.0–36.0)
MCV: 91.5 fL (ref 80.0–100.0)
PLATELETS: 226 10*3/uL (ref 150–440)
RBC: 4.08 MIL/uL — ABNORMAL LOW (ref 4.40–5.90)
RDW: 14.7 % — AB (ref 11.5–14.5)
WBC: 7.2 10*3/uL (ref 3.8–10.6)

## 2017-08-01 LAB — BASIC METABOLIC PANEL
Anion gap: 1 — ABNORMAL LOW (ref 5–15)
BUN: 36 mg/dL — AB (ref 6–20)
CALCIUM: 8.3 mg/dL — AB (ref 8.9–10.3)
CHLORIDE: 117 mmol/L — AB (ref 101–111)
CO2: 25 mmol/L (ref 22–32)
CREATININE: 1.92 mg/dL — AB (ref 0.61–1.24)
GFR calc Af Amer: 37 mL/min — ABNORMAL LOW (ref >60)
GFR calc non Af Amer: 32 mL/min — ABNORMAL LOW (ref >60)
Glucose, Bld: 112 mg/dL — ABNORMAL HIGH (ref 65–99)
Potassium: 4.5 mmol/L (ref 3.5–5.1)
Sodium: 143 mmol/L (ref 135–145)

## 2017-08-01 NOTE — Progress Notes (Signed)
Arthur Bowen  A and O x 4. VSS. Pt tolerating diet well. No complaints of pain or nausea. IV removed intact, prescriptions given. Pt voiced understanding of discharge instructions with no further questions. Pt discharged via wheelchair with axillary.  Lynann Bologna MSN, RN-BC  Allergies as of 08/01/2017      Reactions   Penicillins Other (See Comments)   Sulfa Antibiotics Other (See Comments)      Medication List    TAKE these medications   amLODipine 5 MG tablet Commonly known as:  NORVASC Take 1 tablet (5 mg total) by mouth daily.   aspirin 81 MG tablet Take 1 tablet by mouth daily. Reported on 09/02/2015   atorvastatin 40 MG tablet Commonly known as:  LIPITOR TAKE ONE TABLET BY MOUTH ONCE DAILY   finasteride 5 MG tablet Commonly known as:  PROSCAR Take 1 tablet (5 mg total) by mouth daily.   fluticasone 50 MCG/ACT nasal spray Commonly known as:  FLONASE Place 2 sprays into both nostrils daily.   lisinopril 40 MG tablet Commonly known as:  PRINIVIL,ZESTRIL Take 1 tablet (40 mg total) by mouth daily.   metoprolol tartrate 50 MG tablet Commonly known as:  LOPRESSOR Take 1 tablet (50 mg total) by mouth 2 (two) times daily.   omega-3 acid ethyl esters 1 g capsule Commonly known as:  LOVAZA Take by mouth 2 (two) times daily.   tamsulosin 0.4 MG Caps capsule Commonly known as:  FLOMAX Take 1 capsule (0.4 mg total) by mouth daily.   VITAMIN B 12 PO Take by mouth daily.       Vitals:   08/01/17 0532 08/01/17 0919  BP: (!) 157/63 (!) 170/74  Pulse: (!) 57 (!) 58  Resp: 17   Temp: 98.4 F (36.9 C)   SpO2: 95%

## 2017-08-01 NOTE — Progress Notes (Signed)
[facility]  ROUNDING NOTE   Subjective:  Patient s/p percutaneous renal biopsy. Doing well.  No hematuria.    Objective:  Vital signs in last 24 hours:  Temp:  [98.2 F (36.8 C)-98.7 F (37.1 C)] 98.4 F (36.9 C) (02/14 0532) Pulse Rate:  [54-64] 58 (02/14 0919) Resp:  [16-24] 17 (02/14 0532) BP: (110-176)/(55-82) 170/74 (02/14 0919) SpO2:  [92 %-100 %] 95 % (02/14 0532)  Weight change:  Filed Weights   07/31/17 0727  Weight: 93 kg (205 lb)    Intake/Output: I/O last 3 completed shifts: In: 240 [P.O.:240] Out: 275 [Urine:275]   Intake/Output this shift:  No intake/output data recorded.  Physical Exam: General: No acute distress  Head: Normocephalic, atraumatic. Moist oral mucosal membranes  Eyes: Anicteric  Neck: Supple, trachea midline  Lungs:  Clear to auscultation, normal effort  Heart: S1S2 no rubs  Abdomen:  Soft, nontender, bowel sounds present  Extremities: Trace peripheral edema.  Neurologic: Awake, alert, following commands  Skin: No lesions  Access:     Basic Metabolic Panel: Recent Labs  Lab 07/29/17 0855 08/01/17 0448  NA 138 143  K 4.4 4.5  CL 108 117*  CO2 22 25  GLUCOSE 103* 112*  BUN 34* 36*  CREATININE 1.96* 1.92*  CALCIUM 8.7* 8.3*    Liver Function Tests: Recent Labs  Lab 07/29/17 0855  AST 19  ALT 16*  ALKPHOS 124  BILITOT 0.6  PROT 6.2*  ALBUMIN 2.9*   No results for input(s): LIPASE, AMYLASE in the last 168 hours. No results for input(s): AMMONIA in the last 168 hours.  CBC: Recent Labs  Lab 07/29/17 0855 08/01/17 0448  WBC 7.9 7.2  HGB 13.8 12.5*  HCT 41.6 37.3*  MCV 91.9 91.5  PLT 264 226    Cardiac Enzymes: No results for input(s): CKTOTAL, CKMB, CKMBINDEX, TROPONINI in the last 168 hours.  BNP: Invalid input(s): POCBNP  CBG: No results for input(s): GLUCAP in the last 168 hours.  Microbiology: Results for orders placed or performed in visit on 04/19/16  CULTURE, URINE  COMPREHENSIVE     Status: Abnormal   Collection Time: 04/19/16 10:03 AM  Result Value Ref Range Status   Urine Culture, Comprehensive Final report (A)  Final   Organism ID, Bacteria Staphylococcus aureus (A)  Final    Comment: Greater than 100,000 colony forming units per mL Based on susceptibility to oxacillin this isolate would be susceptible to: * Beta-lactam/beta-lactamase inhibitor combinations; such as:     Amoxicillin-clavulanic acid     Ampicillin-sulbactam * Antistaphylococcal cephems; such as:     Cefaclor     Cefuroxime * Antistaphylococcal carbapenems; such as:     Imipenem     Meropenem Most isolates of Staphylococcus sp. produce a beta-lactamase enzyme rendering them resistant to penicillin. Please contact the laboratory if penicillin is being considered for therapy.    ANTIMICROBIAL SUSCEPTIBILITY Comment  Final    Comment:       ** S = Susceptible; I = Intermediate; R = Resistant **                    P = Positive; N = Negative             MICS are expressed in micrograms per mL    Antibiotic                 RSLT#1    RSLT#2    RSLT#3    RSLT#4 Ciprofloxacin  S Gentamicin                     S Levofloxacin                   S Linezolid                      S Moxifloxacin                   S Nitrofurantoin                 S Oxacillin                      S Quinupristin/Dalfopristin      S Rifampin                       S Tetracycline                   S Trimethoprim/Sulfa             S Vancomycin                     S   Microscopic Examination     Status: Abnormal   Collection Time: 04/19/16 10:03 AM  Result Value Ref Range Status   WBC, UA None seen 0 - 5 /hpf Final   RBC, UA >30 (A) 0 - 2 /hpf Final   Epithelial Cells (non renal) None seen 0 - 10 /hpf Final   Bacteria, UA None seen None seen/Few Final    Coagulation Studies: No results for input(s): LABPROT, INR in the last 72 hours.  Urinalysis: No results for input(s): COLORURINE,  LABSPEC, PHURINE, GLUCOSEU, HGBUR, BILIRUBINUR, KETONESUR, PROTEINUR, UROBILINOGEN, NITRITE, LEUKOCYTESUR in the last 72 hours.  Invalid input(s): APPERANCEUR    Imaging: US Biopsy (kidney)  Result Date: 07/31/2017 INDICATION: 77 year old with proteinuria and hypertension. Chronic kidney disease. EXAM: ULTRASOUND-GUIDED LEFT RENAL BIOPSY MEDICATIONS: None. ANESTHESIA/SEDATION: Moderate (conscious) sedation was employed during this procedure. A total of Versed 1.0 mg and Fentanyl 50 mcg was administered intravenously. Moderate Sedation Time: 11 minutes minutes. The patient's level of consciousness and vital signs were monitored continuously by radiology nursing throughout the procedure under my direct supervision. Total of 20 mg of hydralazine IV was given prior to the procedure. FLUOROSCOPY TIME:  None COMPLICATIONS: None immediate. PROCEDURE: The patient presented to the hospital hypertensive with systolic blood pressures greater than 200. Findings were discussed with the referring physician Dr. Holley Raring. We planned to decrease the blood pressure with IV medications and plan for overnight observation and blood pressure control. Informed written consent was obtained from the patient after a thorough discussion of the procedural risks, benefits and alternatives. All questions were addressed. A timeout was performed prior to the initiation of the procedure. The patient was placed prone on the ultrasound table. The left kidney lower pole was easily visualized. The left flank was prepped with chlorhexidine and sterile field was created. Skin and soft tissues were anesthetized with 1% lidocaine. Using ultrasound guidance, 16 gauge core needle was directed into the left kidney lower pole and a core biopsy was obtained. A second core biopsy was obtained from the lower pole using ultrasound guidance. Two adequate specimens were obtained and placed on a Telfa pad. Bandage placed over the puncture site. FINDINGS: Renal  cysts. Core biopsies obtained from the left kidney lower pole. No  bleeding or hematoma formation following the core biopsies. IMPRESSION: Successful ultrasound-guided core biopsies from the left kidney lower pole. Electronically Signed   By: Markus Daft M.D.   On: 07/31/2017 11:06     Medications:    . amLODipine  5 mg Oral Daily  . atorvastatin  40 mg Oral Daily  . finasteride  5 mg Oral Daily  . fluticasone  2 spray Each Nare Daily  . lisinopril  40 mg Oral Daily  . metoprolol tartrate  50 mg Oral BID  . tamsulosin  0.4 mg Oral Daily  . vitamin B-12  100 mcg Oral Daily   docusate sodium, hydrALAZINE, HYDROcodone-acetaminophen  Assessment/ Plan:  77 y.o. male with past medical history of hypertension, CKD stage III, proteinruia who presents for renal biospy.    1.  CKD stage III 2.  Hypertension. 3.  Proteinuria.  Plan:  Patient s/p left percutaneous ultrasound guided left renal biopsy.  He tolerated well.  No flank pain or gross hematuria now.  Cleared for discharge.  Continue amlodipine, lisinopril, metoprolol for blood pressure control.  I have sent in hydralazine 25mg  po TID in addition to his pharmacy.  No asprin for two weeks, no heavy lifting > 5lbs for 2 weeks, will follow up in office as scheduled.    LOS: 0 Haruna Rohlfs 2/14/20199:39 AM

## 2017-08-01 NOTE — Discharge Summary (Signed)
[facility] NAME: Arthur Bowen    MR#:  [id number]  DATE OF BIRTH:  05-20-1941  DATE OF ADMISSION:  07/31/2017 ADMITTING PHYSICIAN: Vaughan Basta, MD  DATE OF DISCHARGE: 08/01/2017  PRIMARY CARE PHYSICIAN: Glean Hess, MD    ADMISSION DIAGNOSIS:  Proteinuria, unspecified type [R80.9] Uncontrolled hypertension [I10]  DISCHARGE DIAGNOSIS:  Active Problems:   Uncontrolled hypertension   SECONDARY DIAGNOSIS:   Past Medical History:  Diagnosis Date  . Acquired phimosis   . BPH (benign prostatic hypertrophy)   . Chronic kidney disease   . Dyspnea    with exertion  . Elevated PSA   . Gross hematuria   . Hematuria   . History of hiatal hernia   . Hypertension   . 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 Renal mass, left     HOSPITAL COURSE:   * Uncontrolled Htn  During procedure Hydralazine inj was given, BP stable now.   COnt home meds and monitor.   Keep Hydralazine inj as needed.   BP stayed stable, pt feels fine.  * CKD with proteinuria   Follow biopsy report with nephrology.  * Hyperlipidemia   Cont atorvastatin.  * Active smoking   Nicotine patch, Counselled to quit smoking for 4 min.  * Hx of CABG   Hold aspirin for now after Biopsy today.   Hb stable, no pain/ bleeding, resume aspirin now.   DISCHARGE CONDITIONS:   Stable.  CONSULTS OBTAINED:  Treatment Team:  Anthonette Legato, MD  DRUG ALLERGIES:   Allergies  Allergen Reactions  . Penicillins Other (See Comments)  . Sulfa Antibiotics Other (See Comments)    DISCHARGE MEDICATIONS:   Allergies as of 08/01/2017      Reactions   Penicillins Other (See Comments)   Sulfa Antibiotics Other (See Comments)      Medication List    TAKE these medications   amLODipine 5 MG tablet Commonly known as:  NORVASC Take 1 tablet (5 mg total) by mouth daily.   aspirin 81 MG tablet Take 1 tablet by mouth daily.  Reported on 09/02/2015   atorvastatin 40 MG tablet Commonly known as:  LIPITOR TAKE ONE TABLET BY MOUTH ONCE DAILY   finasteride 5 MG tablet Commonly known as:  PROSCAR Take 1 tablet (5 mg total) by mouth daily.   fluticasone 50 MCG/ACT nasal spray Commonly known as:  FLONASE Place 2 sprays into both nostrils daily.   lisinopril 40 MG tablet Commonly known as:  PRINIVIL,ZESTRIL Take 1 tablet (40 mg total) by mouth daily.   metoprolol tartrate 50 MG tablet Commonly known as:  LOPRESSOR Take 1 tablet (50 mg total) by mouth 2 (two) times daily.   omega-3 acid ethyl esters 1 g capsule Commonly known as:  LOVAZA Take by mouth 2 (two) times daily.   tamsulosin 0.4 MG Caps capsule Commonly known as:  FLOMAX Take 1 capsule (0.4 mg total) by mouth daily.   VITAMIN B 12 PO Take by mouth daily.        DISCHARGE INSTRUCTIONS:    Follow with Nephrology in 1 week.  If you experience worsening of your admission symptoms, develop shortness of breath, life threatening emergency, suicidal or homicidal thoughts you must seek medical attention immediately by calling 911 or calling your MD immediately  if symptoms less severe.  You Must read complete instructions/literature along with all the possible adverse reactions/side effects for all the Medicines you take  and that have been prescribed to you. Take any new Medicines after you have completely understood and accept all the possible adverse reactions/side effects.   Please note  You were cared for by a hospitalist during your hospital stay. If you have any questions about your discharge medications or the care you received while you were in the hospital after you are discharged, you can call the unit and asked to speak with the hospitalist on call if the hospitalist that took care of you is not available. Once you are discharged, your primary care physician will handle any further medical issues. Please note that NO REFILLS for any  discharge medications will be authorized once you are discharged, as it is imperative that you return to your primary care physician (or establish a relationship with a primary care physician if you do not have one) for your aftercare needs so that they can reassess your need for medications and monitor your lab values.    Today   CHIEF COMPLAINT:  No chief complaint on file.   HISTORY OF PRESENT ILLNESS:  Arthur Bowen  is a 77 y.o. male with a known history of BPH, Hematuria, Proteinuria, CKD, was scheduled to have renal biopsy by radiologist, noted to have high BP so suggested to have observation in hospital tonight after the biopsy. Pt have no complains.   VITAL SIGNS:  Blood pressure (!) 182/56, pulse (!) 57, temperature 98.4 F (36.9 C), temperature source Oral, resp. rate 17, height 6' (1.829 m), weight 93 kg (205 lb), SpO2 95 %.  I/O:    Intake/Output Summary (Last 24 hours) at 08/01/2017 0920 Last data filed at 08/01/2017 0715 Gross per 24 hour  Intake 240 ml  Output 275 ml  Net -35 ml    PHYSICAL EXAMINATION:  GENERAL:  77 y.o.-year-old patient lying in the bed with no acute distress.  EYES: Pupils equal, round, reactive to light and accommodation. No scleral icterus. Extraocular muscles intact.  HEENT: Head atraumatic, normocephalic. Oropharynx and nasopharynx clear.  NECK:  Supple, no jugular venous distention. No thyroid enlargement, no tenderness.  LUNGS: Normal breath sounds bilaterally, no wheezing, rales,rhonchi or crepitation. No use of accessory muscles of respiration.  CARDIOVASCULAR: S1, S2 normal. No murmurs, rubs, or gallops.  ABDOMEN: Soft, non-tender, non-distended. Bowel sounds present. No organomegaly or mass.  EXTREMITIES: No pedal edema, cyanosis, or clubbing.  NEUROLOGIC: Cranial nerves II through XII are intact. Muscle strength 5/5 in all extremities. Sensation intact. Gait not checked.  PSYCHIATRIC: The patient is alert and oriented x 3.  SKIN: No  obvious rash, lesion, or ulcer.   DATA REVIEW:   CBC Recent Labs  Lab 08/01/17 0448  WBC 7.2  HGB 12.5*  HCT 37.3*  PLT 226    Chemistries  Recent Labs  Lab 07/29/17 0855 08/01/17 0448  NA 138 143  K 4.4 4.5  CL 108 117*  CO2 22 25  GLUCOSE 103* 112*  BUN 34* 36*  CREATININE 1.96* 1.92*  CALCIUM 8.7* 8.3*  AST 19  --   ALT 16*  --   ALKPHOS 124  --   BILITOT 0.6  --     Cardiac Enzymes No results for input(s): TROPONINI in the last 168 hours.  Microbiology Results  Results for orders placed or performed in visit on 04/19/16  CULTURE, URINE COMPREHENSIVE     Status: Abnormal   Collection Time: 04/19/16 10:03 AM  Result Value Ref Range Status   Urine Culture, Comprehensive Final report (A)  Final   Organism ID, Bacteria Staphylococcus aureus (A)  Final    Comment: Greater than 100,000 colony forming units per mL Based on susceptibility to oxacillin this isolate would be susceptible to: * Beta-lactam/beta-lactamase inhibitor combinations; such as:     Amoxicillin-clavulanic acid     Ampicillin-sulbactam * Antistaphylococcal cephems; such as:     Cefaclor     Cefuroxime * Antistaphylococcal carbapenems; such as:     Imipenem     Meropenem Most isolates of Staphylococcus sp. produce a beta-lactamase enzyme rendering them resistant to penicillin. Please contact the laboratory if penicillin is being considered for therapy.    ANTIMICROBIAL SUSCEPTIBILITY Comment  Final    Comment:       ** S = Susceptible; I = Intermediate; R = Resistant **                    P = Positive; N = Negative             MICS are expressed in micrograms per mL    Antibiotic                 RSLT#1    RSLT#2    RSLT#3    RSLT#4 Ciprofloxacin                  S Gentamicin                     S Levofloxacin                   S Linezolid                      S Moxifloxacin                   S Nitrofurantoin                 S Oxacillin                       S Quinupristin/Dalfopristin      S Rifampin                       S Tetracycline                   S Trimethoprim/Sulfa             S Vancomycin                     S   Microscopic Examination     Status: Abnormal   Collection Time: 04/19/16 10:03 AM  Result Value Ref Range Status   WBC, UA None seen 0 - 5 /hpf Final   RBC, UA >30 (A) 0 - 2 /hpf Final   Epithelial Cells (non renal) None seen 0 - 10 /hpf Final   Bacteria, UA None seen None seen/Few Final    RADIOLOGY:  US Biopsy (kidney)  Result Date: 07/31/2017 INDICATION: 77 year old with proteinuria and hypertension. Chronic kidney disease. EXAM: ULTRASOUND-GUIDED LEFT RENAL BIOPSY MEDICATIONS: None. ANESTHESIA/SEDATION: Moderate (conscious) sedation was employed during this procedure. A total of Versed 1.0 mg and Fentanyl 50 mcg was administered intravenously. Moderate Sedation Time: 11 minutes minutes. The patient's level of consciousness and vital signs were monitored continuously by radiology nursing throughout the procedure under my direct supervision. Total of 20 mg of hydralazine IV was given prior to  the procedure. FLUOROSCOPY TIME:  None COMPLICATIONS: None immediate. PROCEDURE: The patient presented to the hospital hypertensive with systolic blood pressures greater than 200. Findings were discussed with the referring physician Dr. Holley Raring. We planned to decrease the blood pressure with IV medications and plan for overnight observation and blood pressure control. Informed written consent was obtained from the patient after a thorough discussion of the procedural risks, benefits and alternatives. All questions were addressed. A timeout was performed prior to the initiation of the procedure. The patient was placed prone on the ultrasound table. The left kidney lower pole was easily visualized. The left flank was prepped with chlorhexidine and sterile field was created. Skin and soft tissues were anesthetized with 1% lidocaine. Using  ultrasound guidance, 16 gauge core needle was directed into the left kidney lower pole and a core biopsy was obtained. A second core biopsy was obtained from the lower pole using ultrasound guidance. Two adequate specimens were obtained and placed on a Telfa pad. Bandage placed over the puncture site. FINDINGS: Renal cysts. Core biopsies obtained from the left kidney lower pole. No bleeding or hematoma formation following the core biopsies. IMPRESSION: Successful ultrasound-guided core biopsies from the left kidney lower pole. Electronically Signed   By: Markus Daft M.D.   On: 07/31/2017 11:06    EKG:  No orders found for this or any previous visit.    Management plans discussed with the patient, family and they are in agreement.  CODE STATUS:     Code Status Orders  (From admission, onward)        Start     Ordered   07/31/17 1019  Full code  Continuous     07/31/17 1018    Code Status History    Date Active Date Inactive Code Status Order ID Comments User Context   This patient has a current code status but no historical code status.      TOTAL TIME TAKING CARE OF THIS PATIENT: 35 minutes.    Vaughan Basta M.D on 08/01/2017 at 9:20 AM  Between 7am to 6pm - Pager - [phone]  After 6pm go to [email or web address] - password [email or web address] [facility]  Office  [phone]  CC: Primary care physician; Glean Hess, MD   Note: This dictation was prepared with Dragon dictation along with smaller phrase technology. Any transcriptional errors that result from this process are unintentional.

## 2017-08-02 ENCOUNTER — Telehealth: Payer: Self-pay

## 2017-08-02 NOTE — Telephone Encounter (Signed)
TOC #1. Called to f/u after d/c from [facility] on 08/01/17. This pt does not have a hosp f/u appt scheduled w/ Dr. Army Melia NOR does it appear one was required. Discharge planning included:  - f/u w/ Dr. Holley Raring to further discuss renal bx - cont. smoking cessation w/ use of Nicoderm Wanted to ensure an appt has been scheduled with this specialist as indicated in discharge instructions. LVM requesting returned call.

## 2017-08-06 ENCOUNTER — Telehealth: Payer: Self-pay

## 2017-08-06 NOTE — Telephone Encounter (Signed)
Patient's daughter called yesterday leaving VM stating she is concerned about her fathers BP. It is currently running 174/72. The lowest reading she has gotten in the last few days is 151/74. Called daughter- Barnett Applebaum- and she informed me patient is on new medication hydralazine 25 mg.  Spoke with Dr Army Melia- informed Tammy that patient needs to continue his new medication as well as make sure he is taking all of the old ones as directed. Continue to see Dr Zollie Scale who prescribed new medication since he is monitoring his BP with new medication. She verbalized understanding of this and they see Dr Zollie Scale March 1st.

## 2017-08-07 LAB — SURGICAL PATHOLOGY

## 2017-08-08 ENCOUNTER — Encounter: Payer: Self-pay | Admitting: Nephrology

## 2017-08-16 DIAGNOSIS — N183 Chronic kidney disease, stage 3 (moderate): Secondary | ICD-10-CM | POA: Diagnosis not present

## 2017-08-16 DIAGNOSIS — R809 Proteinuria, unspecified: Secondary | ICD-10-CM | POA: Diagnosis not present

## 2017-08-16 DIAGNOSIS — N2581 Secondary hyperparathyroidism of renal origin: Secondary | ICD-10-CM | POA: Diagnosis not present

## 2017-08-16 DIAGNOSIS — I10 Essential (primary) hypertension: Secondary | ICD-10-CM | POA: Diagnosis not present

## 2017-08-26 ENCOUNTER — Encounter: Payer: Self-pay | Admitting: Nephrology

## 2017-09-06 DIAGNOSIS — D631 Anemia in chronic kidney disease: Secondary | ICD-10-CM | POA: Diagnosis not present

## 2017-09-06 DIAGNOSIS — N2581 Secondary hyperparathyroidism of renal origin: Secondary | ICD-10-CM | POA: Diagnosis not present

## 2017-09-06 DIAGNOSIS — N183 Chronic kidney disease, stage 3 (moderate): Secondary | ICD-10-CM | POA: Diagnosis not present

## 2017-09-06 DIAGNOSIS — N041 Nephrotic syndrome with focal and segmental glomerular lesions: Secondary | ICD-10-CM | POA: Diagnosis not present

## 2017-09-06 DIAGNOSIS — R809 Proteinuria, unspecified: Secondary | ICD-10-CM | POA: Diagnosis not present

## 2017-09-13 ENCOUNTER — Other Ambulatory Visit: Payer: Self-pay | Admitting: Internal Medicine

## 2017-10-09 ENCOUNTER — Ambulatory Visit (INDEPENDENT_AMBULATORY_CARE_PROVIDER_SITE_OTHER): Payer: Medicare Other | Admitting: Internal Medicine

## 2017-10-09 ENCOUNTER — Ambulatory Visit
Admission: RE | Admit: 2017-10-09 | Discharge: 2017-10-09 | Disposition: A | Discharge status: Home / Self Care | Payer: Medicare Other | Source: Ambulatory Visit | Attending: Internal Medicine | Admitting: Internal Medicine

## 2017-10-09 ENCOUNTER — Encounter: Payer: Self-pay | Admitting: Internal Medicine

## 2017-10-09 VITALS — BP 148/84 | HR 69 | Temp 98.3°F | Ht 72.0 in | Wt 205.0 lb

## 2017-10-09 DIAGNOSIS — I700 Atherosclerosis of aorta: Secondary | ICD-10-CM | POA: Insufficient documentation

## 2017-10-09 DIAGNOSIS — N185 Chronic kidney disease, stage 5: Secondary | ICD-10-CM | POA: Insufficient documentation

## 2017-10-09 DIAGNOSIS — I129 Hypertensive chronic kidney disease with stage 1 through stage 4 chronic kidney disease, or unspecified chronic kidney disease: Secondary | ICD-10-CM

## 2017-10-09 DIAGNOSIS — J181 Lobar pneumonia, unspecified organism: Secondary | ICD-10-CM | POA: Diagnosis not present

## 2017-10-09 DIAGNOSIS — J189 Pneumonia, unspecified organism: Secondary | ICD-10-CM

## 2017-10-09 DIAGNOSIS — N184 Chronic kidney disease, stage 4 (severe): Secondary | ICD-10-CM | POA: Diagnosis not present

## 2017-10-09 MED ORDER — DOXYCYCLINE HYCLATE 100 MG PO TABS: 100.0000 mg | ORAL_TABLET | Freq: Two times a day (BID) | ORAL | 0 refills | Status: AC

## 2017-10-09 NOTE — Patient Instructions (Signed)
Breo - one inhalation once a day

## 2017-10-09 NOTE — Progress Notes (Signed)
Date:  10/09/2017   Name:  Arthur Bowen   DOB:  Nov 10, 1940   MRN:  [id number]   Chief Complaint: Cough (X 1 week coughing with green production. Chest congestion. Drainage in throat. Cough worse at night due to drainage.)  Cough  This is a new problem. The current episode started in the past 7 days. The problem has been unchanged. The problem occurs every few minutes. The cough is productive of sputum. Associated symptoms include ear congestion, postnasal drip, shortness of breath and wheezing. Pertinent negatives include no chills, fever or headaches.   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  He was started on Prednisone but could not tolerate it due to leg swelling.  He now on another medication 1000 mg bid - he does not have the name with him.  At last check his GFR was stable ~30.  He is on 4 agents for control of BP.  Review of Systems  Constitutional: Positive for fatigue. Negative for chills and fever.  HENT: Positive for postnasal drip.   Eyes: Negative for visual disturbance.  Respiratory: Positive for cough, shortness of breath and wheezing.   Gastrointestinal: Negative for diarrhea, nausea and vomiting.  Neurological: Negative for dizziness and headaches.    Patient Active Problem List   Diagnosis Date Noted  . CKD stage 4 secondary to hypertension ([place]) 10/09/2017  . Uncontrolled hypertension 07/31/2017  . Primary osteoarthritis of right knee 01/09/2017  . Elevated PSA 08/14/2015  . Renal mass 08/14/2015  . BPH with obstruction/lower urinary tract symptoms 08/14/2015  . History of hematuria 08/14/2015  . CAD in native artery 04/06/2015  . Dyslipidemia 04/06/2015  . Essential (primary) hypertension 04/06/2015  . Neuropathy 04/06/2015  . Peripheral vascular disease ([place]) 04/06/2015  . 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 Senile purpura ([place]) 04/06/2015    Prior to Admission medications   Medication Sig Start Date End Date Taking? Authorizing Provider    amLODipine (NORVASC) 5 MG tablet Take 1 tablet (5 mg total) by mouth daily. 06/03/17  Yes Glean Hess, MD  aspirin 81 MG tablet Take 1 tablet by mouth daily. Reported on 09/02/2015   Yes [provider]  atorvastatin (LIPITOR) 40 MG tablet TAKE ONE TABLET BY MOUTH ONCE DAILY 10/29/16  Yes Glean Hess, MD  calcitRIOL (ROCALTROL) 0.25 MCG capsule Take 0.25 mcg by mouth daily.   Yes [provider]  Cyanocobalamin (VITAMIN B 12 PO) Take by mouth daily.   Yes [provider]  finasteride (PROSCAR) 5 MG tablet TAKE 1 TABLET BY MOUTH ONCE DAILY 09/13/17  Yes Glean Hess, MD  fluticasone [facility]) 50 MCG/ACT nasal spray Place 2 sprays into both nostrils daily. 10/24/15  Yes Glean Hess, MD  hydrALAZINE (APRESOLINE) 50 MG tablet Take 50 mg by mouth 3 (three) times daily.   Yes [provider]  lisinopril (PRINIVIL,ZESTRIL) 40 MG tablet Take 1 tablet (40 mg total) by mouth daily. 01/09/17  Yes Glean Hess, MD  metoprolol tartrate (LOPRESSOR) 50 MG tablet Take 1 tablet (50 mg total) by mouth 2 (two) times daily. 06/03/17  Yes Glean Hess, MD  tamsulosin (FLOMAX) 0.4 MG CAPS capsule Take 1 capsule (0.4 mg total) by mouth daily. 05/26/15  Yes Glean Hess, MD    Allergies  Allergen Reactions  . Penicillins Other (See Comments)  . Sulfa Antibiotics Other (See Comments)    Past Surgical History:  Procedure Laterality Date  . Circumscision  09/2015  .  CORONARY ARTERY BYPASS GRAFT  2002   x3 @ [facility]  . INGUINAL HERNIA REPAIR Left 2011  . RENAL BIOPSY  2014   neg in w/u renal mass/hematuria    Social History   Tobacco Use  . Smoking status: Current Every Day Smoker    Packs/day: 0.50    Types: Cigarettes  . Smokeless tobacco: Never Used  Substance Use Topics  . Alcohol use: No    Alcohol/week: 0.0 oz  . Drug use: No     Medication list has been reviewed and updated.  PHQ 2/9 Scores 05/30/2016 05/26/2015  PHQ - 2  Score 0 0    Physical Exam  Constitutional: He is oriented to person, place, and time. He appears well-developed. No distress.  HENT:  Head: Normocephalic and atraumatic.  Eyes: Pupils are equal, round, and reactive to light.  Neck: Normal range of motion. Neck supple.  Cardiovascular: Normal rate and regular rhythm.  Occasional extrasystoles are present.  Pulmonary/Chest: Effort normal. No respiratory distress. He has wheezes in the right lower field and the left lower field. He has rhonchi in the left upper field and the left middle field.  Musculoskeletal: Normal range of motion. He exhibits no edema.  Lymphadenopathy:    He has no cervical adenopathy.  Neurological: He is alert and oriented to person, place, and time.  Skin: Skin is warm and dry. No rash noted.  Psychiatric: He has a normal mood and affect. His behavior is normal. Thought content normal.    BP (!) 148/84   Pulse 69   Temp 98.3 F (36.8 C) (Oral)   Ht 6' (1.829 m)   Wt 205 lb (93 kg)   SpO2 98%   BMI 27.80 kg/m   Assessment and Plan: 1. Community acquired pneumonia of left upper lobe of lung ([place]) By exam - begin Breo once a day (sample) - DG Chest 2 View; Future - doxycycline (VIBRA-TABS) 100 MG tablet; Take 1 tablet (100 mg total) by mouth 2 (two) times daily for 10 days.  Dispense: 20 tablet; Refill: 0  2. CKD stage 4 secondary to hypertension ([place]) Continue to follow up with Nephrology Will try to get accurate med list   Meds ordered this encounter  Medications  . doxycycline (VIBRA-TABS) 100 MG tablet    Sig: Take 1 tablet (100 mg total) by mouth 2 (two) times daily for 10 days.    Dispense:  20 tablet    Refill:  0    Partially dictated using Editor, commissioning. Any errors are unintentional.  Halina Maidens, MD [facility]  10/09/2017

## 2017-10-14 DIAGNOSIS — N183 Chronic kidney disease, stage 3 (moderate): Secondary | ICD-10-CM | POA: Diagnosis not present

## 2017-10-14 DIAGNOSIS — N041 Nephrotic syndrome with focal and segmental glomerular lesions: Secondary | ICD-10-CM | POA: Diagnosis not present

## 2017-10-14 DIAGNOSIS — R809 Proteinuria, unspecified: Secondary | ICD-10-CM | POA: Diagnosis not present

## 2017-10-14 DIAGNOSIS — D631 Anemia in chronic kidney disease: Secondary | ICD-10-CM | POA: Diagnosis not present

## 2017-10-14 DIAGNOSIS — N2581 Secondary hyperparathyroidism of renal origin: Secondary | ICD-10-CM | POA: Diagnosis not present

## 2017-10-14 DIAGNOSIS — I10 Essential (primary) hypertension: Secondary | ICD-10-CM | POA: Diagnosis not present

## 2017-10-15 ENCOUNTER — Encounter: Payer: Self-pay | Admitting: Internal Medicine

## 2017-10-15 DIAGNOSIS — N2581 Secondary hyperparathyroidism of renal origin: Secondary | ICD-10-CM | POA: Insufficient documentation

## 2017-10-24 ENCOUNTER — Inpatient Hospital Stay: Payer: Medicare Other | Attending: Oncology | Admitting: Oncology

## 2017-10-24 ENCOUNTER — Other Ambulatory Visit: Payer: Self-pay

## 2017-10-24 ENCOUNTER — Inpatient Hospital Stay: Payer: Medicare Other

## 2017-10-24 ENCOUNTER — Encounter: Payer: Self-pay | Admitting: Oncology

## 2017-10-24 VITALS — BP 139/72 | HR 54 | Wt 187.7 lb

## 2017-10-24 DIAGNOSIS — N289 Disorder of kidney and ureter, unspecified: Secondary | ICD-10-CM | POA: Insufficient documentation

## 2017-10-24 DIAGNOSIS — N051 Unspecified nephritic syndrome with focal and segmental glomerular lesions: Secondary | ICD-10-CM

## 2017-10-24 DIAGNOSIS — D649 Anemia, unspecified: Secondary | ICD-10-CM | POA: Diagnosis not present

## 2017-10-24 DIAGNOSIS — N041 Nephrotic syndrome with focal and segmental glomerular lesions: Secondary | ICD-10-CM | POA: Diagnosis not present

## 2017-10-24 DIAGNOSIS — F17210 Nicotine dependence, cigarettes, uncomplicated: Secondary | ICD-10-CM | POA: Diagnosis not present

## 2017-10-24 DIAGNOSIS — N2889 Other specified disorders of kidney and ureter: Secondary | ICD-10-CM

## 2017-10-24 DIAGNOSIS — N184 Chronic kidney disease, stage 4 (severe): Secondary | ICD-10-CM | POA: Insufficient documentation

## 2017-10-24 DIAGNOSIS — Z801 Family history of malignant neoplasm of trachea, bronchus and lung: Secondary | ICD-10-CM | POA: Diagnosis not present

## 2017-10-24 DIAGNOSIS — D631 Anemia in chronic kidney disease: Secondary | ICD-10-CM

## 2017-10-24 DIAGNOSIS — Z807 Family history of other malignant neoplasms of lymphoid, hematopoietic and related tissues: Secondary | ICD-10-CM | POA: Diagnosis not present

## 2017-10-24 LAB — VITAMIN B12: Vitamin B-12: 6177 pg/mL — ABNORMAL HIGH (ref 180–914)

## 2017-10-24 LAB — IRON AND TIBC
IRON: 119 ug/dL (ref 45–182)
SATURATION RATIOS: 57 % — AB (ref 17.9–39.5)
TIBC: 208 ug/dL — ABNORMAL LOW (ref 250–450)
UIBC: 90 ug/dL

## 2017-10-24 LAB — CBC WITH DIFFERENTIAL/PLATELET
BASOS ABS: 0 10*3/uL (ref 0–0.1)
BASOS PCT: 0 %
EOS ABS: 0.1 10*3/uL (ref 0–0.7)
EOS PCT: 1 %
HCT: 26.4 % — ABNORMAL LOW (ref 40.0–52.0)
Hemoglobin: 9 g/dL — ABNORMAL LOW (ref 13.0–18.0)
LYMPHS PCT: 15 %
Lymphs Abs: 1.4 10*3/uL (ref 1.0–3.6)
MCH: 30.3 pg (ref 26.0–34.0)
MCHC: 33.9 g/dL (ref 32.0–36.0)
MCV: 89.4 fL (ref 80.0–100.0)
MONO ABS: 0.5 10*3/uL (ref 0.2–1.0)
Monocytes Relative: 5 %
Neutro Abs: 7.5 10*3/uL — ABNORMAL HIGH (ref 1.4–6.5)
Neutrophils Relative %: 79 %
PLATELETS: 325 10*3/uL (ref 150–440)
RBC: 2.96 MIL/uL — ABNORMAL LOW (ref 4.40–5.90)
RDW: 15.1 % — AB (ref 11.5–14.5)
WBC: 9.4 10*3/uL (ref 3.8–10.6)

## 2017-10-24 LAB — FOLATE: Folate: 8.6 ng/mL (ref >5.9)

## 2017-10-24 LAB — FERRITIN: Ferritin: 421 ng/mL — ABNORMAL HIGH (ref 24–336)

## 2017-10-24 NOTE — Progress Notes (Signed)
Patient here today as a new patient  

## 2017-10-24 NOTE — Progress Notes (Addendum)
Hematology/Oncology Consult note [facility] Telephone:([phone] Fax:([phone]   Patient Care Team: Glean Hess, MD as PCP - General (Family Medicine) Hollice Espy, MD as Consulting Physician (Urology) Anthonette Legato, MD (Nephrology)  REFERRING PROVIDER: Dr.Lateef CHIEF COMPLAINTS/PURPOSE OF CONSULTATION:  Evaluation of anemia of CKD  HISTORY OF PRESENTING ILLNESS:  Arthur Bowen is a  77 y.o.  male with PMH listed below who was referred to me for evaluation of anemia of CKD.  Patient also has left renal mass which is being followed by urology.  He had recent labs done at the nephrologist office on October 15, 2017 which showed WBC 9.2, hemoglobin 9.1, MCV 89, platelet counts 401 thousand, normal differential.  Creatinine 2.11,  Reports feeling tired.Denies hematochezia, hematuria, hematemesis, epistaxis, black tarry stool or easy bruising.  Last colonoscopy: never had colonoscopy.     Review of Systems  Constitutional: Positive for malaise/fatigue. Negative for chills, fever and weight loss.  HENT: Negative for congestion, ear discharge, ear pain, nosebleeds, sinus pain and sore throat.   Eyes: Negative for double vision, photophobia, pain, discharge and redness.  Respiratory: Negative for cough, hemoptysis, sputum production, shortness of breath and wheezing.   Cardiovascular: Negative for chest pain, palpitations, orthopnea, claudication and leg swelling.  Gastrointestinal: Negative for abdominal pain, blood in stool, constipation, diarrhea, heartburn, melena, nausea and vomiting.  Genitourinary: Negative for dysuria, flank pain, frequency and hematuria.  Musculoskeletal: Negative for back pain, myalgias and neck pain.  Skin: Negative for itching and rash.  Neurological: Negative for dizziness, tingling, tremors, focal weakness, weakness and headaches.  Endo/Heme/Allergies: Negative for environmental allergies. Does not bruise/bleed easily.    Psychiatric/Behavioral: Negative for depression and hallucinations. The patient is not nervous/anxious.     MEDICAL HISTORY:  Past Medical History:  Diagnosis Date  . Acquired phimosis   . BPH (benign prostatic hypertrophy)   . Chronic kidney disease   . Dyspnea    with exertion  . Elevated PSA   . Gross hematuria   . Hematuria   . History of hiatal hernia   . Hypertension   . 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 Renal mass, left     SURGICAL HISTORY: Past Surgical History:  Procedure Laterality Date  . Circumscision  09/2015  . CORONARY ARTERY BYPASS GRAFT  2002   x3 @ [place]  . INGUINAL HERNIA REPAIR Left 2011  . RENAL BIOPSY  2014   neg in w/u renal mass/hematuria    SOCIAL HISTORY: Social History   Socioeconomic History  . Marital status: Divorced    Spouse name: Not on file  . Number of children: 2  . Years of education: Not on file  . Highest education level: Not on file  Occupational History  . Not on file  Social Needs  . Financial resource strain: Patient refused  . Food insecurity:    Worry: Patient refused    Inability: Patient refused  . Transportation needs:    Medical: Patient refused    Non-medical: Patient refused  Tobacco Use  . Smoking status: Current Every Day Smoker    Packs/day: 0.50    Types: Cigarettes  . Smokeless tobacco: Never Used  Substance and Sexual Activity  . Alcohol use: No    Alcohol/week: 0.0 oz  . Drug use: No  . Sexual activity: Not on file  Lifestyle  . Physical activity:    Days per week: Patient refused    Minutes per session: Patient refused  .  Stress: Patient refused  Relationships  . Social connections:    Talks on phone: Patient refused    Gets together: Patient refused    Attends religious service: Patient refused    Active member of club or organization: Patient refused    Attends meetings of clubs or organizations: Patient refused    Relationship status: Patient refused  . Intimate  partner violence:    Fear of current or ex partner: Patient refused    Emotionally abused: Patient refused    Physically abused: Patient refused    Forced sexual activity: Patient refused  Other Topics Concern  . Not on file  Social History Narrative   Patient refused all questions- 06/03/2017    FAMILY HISTORY: Family History  Problem Relation Age of Onset  . Lymphoma Mother   . Hodgkin's lymphoma Mother   . Lung cancer Father   . Heart disease Brother   . Kidney disease Neg Hx   . Prostate cancer Neg Hx     ALLERGIES:  is allergic to penicillins and sulfa antibiotics.  MEDICATIONS:  Current Outpatient Medications  Medication Sig Dispense Refill  . amLODipine (NORVASC) 5 MG tablet Take 1 tablet (5 mg total) by mouth daily. 90 tablet 3  . aspirin 81 MG tablet Take 1 tablet by mouth daily. Reported on 09/02/2015    . atorvastatin (LIPITOR) 40 MG tablet TAKE ONE TABLET BY MOUTH ONCE DAILY 90 tablet 3  . calcitRIOL (ROCALTROL) 0.25 MCG capsule Take 0.25 mcg by mouth daily.    . Cyanocobalamin (VITAMIN B 12 PO) Take by mouth daily.    . finasteride (PROSCAR) 5 MG tablet TAKE 1 TABLET BY MOUTH ONCE DAILY 90 tablet 3  . fluticasone (FLONASE) 50 MCG/ACT nasal spray Place 2 sprays into both nostrils daily. 16 g 5  . hydrALAZINE (APRESOLINE) 50 MG tablet Take 50 mg by mouth 3 (three) times daily.    Marland Kitchen lisinopril (PRINIVIL,ZESTRIL) 40 MG tablet Take 1 tablet (40 mg total) by mouth daily. 90 tablet 3  . metoprolol tartrate (LOPRESSOR) 50 MG tablet Take 1 tablet (50 mg total) by mouth 2 (two) times daily. 180 tablet 3  . mycophenolate (CELLCEPT) 500 MG tablet Take 1,000 mg by mouth 2 (two) times daily.  3  . predniSONE (DELTASONE) 10 MG tablet Take 10 mg by mouth daily.  3  . tamsulosin (FLOMAX) 0.4 MG CAPS capsule Take 1 capsule (0.4 mg total) by mouth daily. 90 capsule 3   No current facility-administered medications for this visit.      PHYSICAL EXAMINATION: ECOG PERFORMANCE  STATUS: 1 - Symptomatic but completely ambulatory Vitals:   10/25/17 1217  BP: 139/72  Pulse: (!) 54   Filed Weights   10/25/17 1217  Weight: 187 lb 11.6 oz (85.2 kg)    Physical Exam  Constitutional: He is oriented to person, place, and time. He appears well-developed and well-nourished. No distress.  HENT:  Head: Normocephalic and atraumatic.  Right Ear: External ear normal.  Left Ear: External ear normal.  Mouth/Throat: Oropharynx is clear and moist.  Eyes: Pupils are equal, round, and reactive to light. EOM are normal. No scleral icterus.  Pale conjuctivae  Neck: Normal range of motion. Neck supple.  Cardiovascular: Normal rate, regular rhythm and normal heart sounds.  Pulmonary/Chest: Effort normal and breath sounds normal. No respiratory distress. He has no wheezes. He has no rales. He exhibits no tenderness.  Abdominal: Soft. Bowel sounds are normal. He exhibits no distension and no mass. There is  no tenderness.  Musculoskeletal: Normal range of motion. He exhibits no deformity.  Bilateral trace edema.   Lymphadenopathy:    He has no cervical adenopathy.  Neurological: He is alert and oriented to person, place, and time. No cranial nerve deficit. Coordination normal.  Skin: Skin is warm and dry. No rash noted.  Psychiatric: He has a normal mood and affect. His behavior is normal. Thought content normal.     LABORATORY DATA:  I have reviewed the data as listed Lab Results  Component Value Date   WBC 7.2 08/01/2017   HGB 12.5 (L) 08/01/2017   HCT 37.3 (L) 08/01/2017   MCV 91.5 08/01/2017   PLT 226 08/01/2017   Recent Labs    06/03/17 0909 07/29/17 0855 08/01/17 0448  NA 144 138 143  K 5.1 4.4 4.5  CL 108* 108 117*  CO2 _0 GLUCOSE 79 103* 112*  BUN 25 34* 36*  CREATININE 1.76* 1.96* 1.92*  CALCIUM 8.6 8.7* 8.3*  GFRNONAA 36* 31* 32*  GFRAA 42* 36* 37*  PROT 5.6* 6.2*  --   ALBUMIN 3.5 2.9*  --   AST 16 19  --   ALT 14 16*  --   ALKPHOS 151*  124  --   BILITOT 0.3 0.6  --        ASSESSMENT & PLAN:  1. Anemia due to stage 4 chronic kidney disease ([place])   2. Anemia, unspecified type   3. Renal mass   4. FSGS (focal segmental glomerulosclerosis)    Discussed with patient that anemia is most likely secondary to chronic kidney disease.  I recommend  anemia work-up to rule out other etiologies.  I will check iron TIBC, ferritin, folate, vitamin B12, multiple myeloma work-up.  Discussed with patient that he may benefit from erythropoietin therapy.  Prior to that, I prefer him to have more than adequate iron store.  # Discussed with patient about the rationale of erythropoietin therapy and side effects including not limited to thrombosis events, stroke, heart attack, progression of underlying cancer, etc. Patient understands and willing to proceed.   # Labs reviewed he has adequate iron store. Can start procrit injection.   # Renal mass, he has been following up with urologist Dr.Brandon. Previous negative biopsy. Last seen in 2017. Will touch base with Dr.Brandon to see if ok to proceed with procrit.   All questions were answered. The patient knows to call the clinic with any problems questions or concerns.  Return of visit: 5 weeks Thank you for this kind referral and the opportunity to participate in the care of this patient. A copy of today's note is routed to referring provider    Arthur Server, MD, Ph D Hematology Oncology [facility] at [facility] Pager- [phone] 10/24/2017

## 2017-10-28 ENCOUNTER — Telehealth: Payer: Self-pay | Admitting: Urology

## 2017-10-28 DIAGNOSIS — N2889 Other specified disorders of kidney and ureter: Secondary | ICD-10-CM

## 2017-10-28 LAB — MULTIPLE MYELOMA PANEL, SERUM
ALPHA 1: 0.2 g/dL (ref 0.0–0.4)
ALPHA2 GLOB SERPL ELPH-MCNC: 0.8 g/dL (ref 0.4–1.0)
Albumin SerPl Elph-Mcnc: 2 g/dL — ABNORMAL LOW (ref 2.9–4.4)
Albumin/Glob SerPl: 1.1 (ref 0.7–1.7)
B-GLOBULIN SERPL ELPH-MCNC: 0.7 g/dL (ref 0.7–1.3)
GAMMA GLOB SERPL ELPH-MCNC: 0.2 g/dL — AB (ref 0.4–1.8)
GLOBULIN, TOTAL: 1.9 g/dL — AB (ref 2.2–3.9)
IGG (IMMUNOGLOBIN G), SERUM: 166 mg/dL — AB (ref 700–1600)
IgA: 58 mg/dL — ABNORMAL LOW (ref 61–437)
IgM (Immunoglobulin M), Srm: 23 mg/dL (ref 15–143)
Total Protein ELP: 3.9 g/dL — ABNORMAL LOW (ref 6.0–8.5)

## 2017-10-28 NOTE — Telephone Encounter (Signed)
Let us go ahead and start with a renal ultrasound as this will be the least expensive modality.  Based on the results, we will discuss further imaging in the office.  Renal ultrasound order placed here today.  Please ensure that this can scheduled prior to his follow-up per his daughter's request.  Hollice Espy, MD

## 2017-10-28 NOTE — Telephone Encounter (Signed)
-----   Message from [place], RN sent at 10/28/2017  9:51 AM EDT ----- Appt was made for 11/22/2017 in [place] office. Pt's daughter states if he needs imaging she wants it to be done prior to this visit. She refuses to make an office visit just to be told he needs imaging. Please advise.   ----- Message ----- From: Hollice Espy, MD Sent: 10/25/2017   9:39 AM To: Earlie Server, MD, Gerhard Perches  Yes, he should have a renal ultrasound and/or CT abdomen pelvis and see me, would like to ensure that the renal cyst in question is not enlarged or become more worrisome.  He has not had any recent cross-sectional imaging that I can see.   My staff-- please arrange for nonurgent follow up with me.    Hollice Espy, MD  ----- Message ----- From: Earlie Server, MD Sent: 10/24/2017   8:49 PM To: Hollice Espy, MD  Dr.Johana Hopkinson,  I am seeing this patient for anemia of CKD which will benefit from erythropoietin therapy.He has a renal mass and follows up with you. He had negative biopsy in the past. You saw him in 2017. He supposed to see you annually.  Recently he was diagnosed with FSGS by nephrology I wonder if you would like to evaluate him before I start procrit?  Let me know your thoughts.   Earlie Server, MD, PhD Hematology Oncology [facility] at [facility] Pager- [phone] 10/24/2017

## 2017-10-28 NOTE — Telephone Encounter (Signed)
Notified daughter that pt needs a renal ultrasound prior to his appt with Dr Erlene Quan on 11/22/2017. Daughter voices understanding. Transferred daughter to Radiology Scheduling Dept to make appt for renal ultrasound.

## 2017-10-31 ENCOUNTER — Encounter: Payer: Self-pay | Admitting: Nephrology

## 2017-11-08 ENCOUNTER — Ambulatory Visit: Payer: Medicare Other | Admitting: Urology

## 2017-11-18 ENCOUNTER — Ambulatory Visit
Admission: RE | Admit: 2017-11-18 | Discharge: 2017-11-18 | Disposition: A | Discharge status: Home / Self Care | Payer: Medicare Other | Source: Ambulatory Visit | Attending: Urology | Admitting: Urology

## 2017-11-18 ENCOUNTER — Encounter (INDEPENDENT_AMBULATORY_CARE_PROVIDER_SITE_OTHER): Payer: Self-pay

## 2017-11-18 DIAGNOSIS — N2889 Other specified disorders of kidney and ureter: Secondary | ICD-10-CM | POA: Diagnosis not present

## 2017-11-18 DIAGNOSIS — N281 Cyst of kidney, acquired: Secondary | ICD-10-CM | POA: Diagnosis not present

## 2017-11-19 ENCOUNTER — Inpatient Hospital Stay: Payer: Medicare Other | Attending: Oncology

## 2017-11-19 ENCOUNTER — Other Ambulatory Visit: Payer: Medicare Other

## 2017-11-19 ENCOUNTER — Other Ambulatory Visit: Payer: Self-pay | Admitting: *Deleted

## 2017-11-19 DIAGNOSIS — N189 Chronic kidney disease, unspecified: Secondary | ICD-10-CM | POA: Diagnosis not present

## 2017-11-19 DIAGNOSIS — D649 Anemia, unspecified: Secondary | ICD-10-CM

## 2017-11-19 DIAGNOSIS — I129 Hypertensive chronic kidney disease with stage 1 through stage 4 chronic kidney disease, or unspecified chronic kidney disease: Secondary | ICD-10-CM | POA: Diagnosis not present

## 2017-11-19 DIAGNOSIS — D631 Anemia in chronic kidney disease: Secondary | ICD-10-CM | POA: Insufficient documentation

## 2017-11-19 LAB — CBC WITH DIFFERENTIAL/PLATELET
BASOS PCT: 1 %
Basophils Absolute: 0.1 10*3/uL (ref 0–0.1)
EOS ABS: 0 10*3/uL (ref 0–0.7)
EOS PCT: 0 %
HEMATOCRIT: 23.9 % — AB (ref 40.0–52.0)
Hemoglobin: 7.9 g/dL — ABNORMAL LOW (ref 13.0–18.0)
Lymphocytes Relative: 12 %
Lymphs Abs: 0.9 10*3/uL — ABNORMAL LOW (ref 1.0–3.6)
MCH: 30.4 pg (ref 26.0–34.0)
MCHC: 33 g/dL (ref 32.0–36.0)
MCV: 92.3 fL (ref 80.0–100.0)
MONO ABS: 0.4 10*3/uL (ref 0.2–1.0)
MONOS PCT: 5 %
NEUTROS ABS: 6.5 10*3/uL (ref 1.4–6.5)
Neutrophils Relative %: 82 %
PLATELETS: 339 10*3/uL (ref 150–440)
RBC: 2.59 MIL/uL — ABNORMAL LOW (ref 4.40–5.90)
RDW: 16.5 % — AB (ref 11.5–14.5)
WBC: 7.9 10*3/uL (ref 3.8–10.6)

## 2017-11-19 LAB — FERRITIN: FERRITIN: 302 ng/mL (ref 24–336)

## 2017-11-19 LAB — IRON AND TIBC
IRON: 81 ug/dL (ref 45–182)
SATURATION RATIOS: 33 % (ref 17.9–39.5)
TIBC: 245 ug/dL — AB (ref 250–450)
UIBC: 164 ug/dL

## 2017-11-21 ENCOUNTER — Other Ambulatory Visit: Payer: Self-pay

## 2017-11-21 ENCOUNTER — Inpatient Hospital Stay (HOSPITAL_BASED_OUTPATIENT_CLINIC_OR_DEPARTMENT_OTHER): Payer: Medicare Other | Admitting: Oncology

## 2017-11-21 ENCOUNTER — Encounter: Payer: Self-pay | Admitting: Oncology

## 2017-11-21 ENCOUNTER — Inpatient Hospital Stay: Payer: Medicare Other

## 2017-11-21 VITALS — BP 119/64 | HR 56 | Temp 97.0°F | Wt 188.3 lb

## 2017-11-21 VITALS — BP 130/61 | HR 52 | Resp 20

## 2017-11-21 DIAGNOSIS — D649 Anemia, unspecified: Secondary | ICD-10-CM

## 2017-11-21 DIAGNOSIS — D631 Anemia in chronic kidney disease: Secondary | ICD-10-CM | POA: Diagnosis not present

## 2017-11-21 DIAGNOSIS — N189 Chronic kidney disease, unspecified: Principal | ICD-10-CM

## 2017-11-21 DIAGNOSIS — R5383 Other fatigue: Secondary | ICD-10-CM

## 2017-11-21 DIAGNOSIS — I129 Hypertensive chronic kidney disease with stage 1 through stage 4 chronic kidney disease, or unspecified chronic kidney disease: Secondary | ICD-10-CM

## 2017-11-21 DIAGNOSIS — Z87891 Personal history of nicotine dependence: Secondary | ICD-10-CM | POA: Diagnosis not present

## 2017-11-21 DIAGNOSIS — N2889 Other specified disorders of kidney and ureter: Secondary | ICD-10-CM | POA: Diagnosis not present

## 2017-11-21 DIAGNOSIS — N184 Chronic kidney disease, stage 4 (severe): Secondary | ICD-10-CM

## 2017-11-21 DIAGNOSIS — N051 Unspecified nephritic syndrome with focal and segmental glomerular lesions: Secondary | ICD-10-CM

## 2017-11-21 DIAGNOSIS — N269 Renal sclerosis, unspecified: Secondary | ICD-10-CM | POA: Diagnosis not present

## 2017-11-21 HISTORY — DX: Anemia, unspecified: D64.9

## 2017-11-21 MED ORDER — EPOETIN ALFA 20000 UNIT/ML IJ SOLN
40000.0000 [IU] | Freq: Once | INTRAMUSCULAR | Status: AC
  Administered 2017-11-21: 40000 [IU] via SUBCUTANEOUS

## 2017-11-21 NOTE — Progress Notes (Signed)
Patient here today for follow up.   

## 2017-11-21 NOTE — Progress Notes (Signed)
Hematology/Oncology Follow up note [facility] [phone] [phone]   Patient Care Team: Glean Hess, MD as PCP - General (Family Medicine) Hollice Espy, MD as Consulting Physician (Urology) Anthonette Legato, MD (Nephrology)  REFERRING PROVIDER: Dr.Lateef CHIEF COMPLAINTS/PURPOSE OF CONSULTATION:  Evaluation of anemia of CKD  HISTORY OF PRESENTING ILLNESS:  Arthur Bowen is a  77 y.o.  male with PMH listed below who was referred to me for evaluation of anemia of CKD.  Patient also has left renal mass which is being followed by urology.  He had recent labs done at the nephrologist office on October 15, 2017 which showed WBC 9.2, hemoglobin 9.1, MCV 89, platelet counts 401 thousand, normal differential.  Creatinine 2.11,  Reports feeling tired.Denies hematochezia, hematuria, hematemesis, epistaxis, black tarry stool or easy bruising.  Last colonoscopy: never had colonoscopy.    INTERVAL HISTORY Arthur Bowen is a 77 y.o. 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  Fatigue: reports worsening fatigue. Chronic onset, perisistent, no aggravating or improving factors, no associated symptoms.  FSGS: off prednisone, started on new medication.  SOB: at baseline.    Review of Systems  Constitutional: Positive for malaise/fatigue. Negative for chills, fever and weight loss.  HENT: Negative for congestion, ear discharge, ear pain, nosebleeds, sinus pain and sore throat.   Eyes: Negative for double vision, photophobia, pain, discharge and redness.  Respiratory: Negative for cough, hemoptysis, sputum production, shortness of breath and wheezing.   Cardiovascular: Negative for chest pain, palpitations, orthopnea, claudication and leg swelling.  Gastrointestinal: Negative for  abdominal pain, blood in stool, constipation, diarrhea, heartburn, melena, nausea and vomiting.  Genitourinary: Negative for dysuria, flank pain, frequency and hematuria.  Musculoskeletal: Negative for back pain, myalgias and neck pain.  Skin: Negative for itching and rash.  Neurological: Negative for dizziness, tingling, tremors, focal weakness, weakness and headaches.  Endo/Heme/Allergies: Negative for environmental allergies. Does not bruise/bleed easily.  Psychiatric/Behavioral: Negative for depression and hallucinations. The patient is not nervous/anxious.     MEDICAL HISTORY:  Past Medical History:  Diagnosis Date  . Acquired phimosis   . BPH (benign prostatic hypertrophy)   . Chronic kidney disease   . Dyspnea    with exertion  . Elevated PSA   . Gross hematuria   . Hematuria   . History of hiatal hernia   . Hypertension   . 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 Renal mass, left     SURGICAL HISTORY: Past Surgical History:  Procedure Laterality Date  . Circumscision  09/2015  . CORONARY ARTERY BYPASS GRAFT  2002   x3 @ [place]  . INGUINAL HERNIA REPAIR Left 2011  . RENAL BIOPSY  2014   neg in w/u renal mass/hematuria    SOCIAL HISTORY: Social History   Socioeconomic History  . Marital status: Divorced    Spouse name: Not on file  . Number of children: 2  . Years of education: Not on file  . Highest education level: Not on file  Occupational History  . Not on file  Social Needs  . Financial resource strain: Patient refused  . Food insecurity:    Worry: Patient refused    Inability: Patient refused  . Transportation needs:    Medical: Patient refused    Non-medical: Patient refused  Tobacco Use  .  Smoking status: Former Smoker    Packs/day: 0.50    Years: 50.00    Pack years: 25.00    Types: Cigarettes    Last attempt to quit: 10/10/2017    Years since quitting: 0.1  . Smokeless tobacco: Former Systems developer    Types: Chew  . Tobacco comment:  last cig 10/10/17   Substance and Sexual Activity  . Alcohol use: No    Alcohol/week: 0.0 oz  . Drug use: No  . Sexual activity: Not on file  Lifestyle  . Physical activity:    Days per week: Patient refused    Minutes per session: Patient refused  . Stress: Patient refused  Relationships  . Social connections:    Talks on phone: Patient refused    Gets together: Patient refused    Attends religious service: Patient refused    Active member of club or organization: Patient refused    Attends meetings of clubs or organizations: Patient refused    Relationship status: Patient refused  . Intimate partner violence:    Fear of current or ex partner: Patient refused    Emotionally abused: Patient refused    Physically abused: Patient refused    Forced sexual activity: Patient refused  Other Topics Concern  . Not on file  Social History Narrative   Patient refused all questions- 06/03/2017    FAMILY HISTORY: Family History  Problem Relation Age of Onset  . Lymphoma Mother   . Hodgkin's lymphoma Mother   . Lung cancer Father   . Heart disease Brother   . Dementia Sister   . Dementia Brother   . Breast cancer Maternal Aunt   . Kidney disease Neg Hx   . Prostate cancer Neg Hx     ALLERGIES:  is allergic to penicillins and sulfa antibiotics.  MEDICATIONS:  Current Outpatient Medications  Medication Sig Dispense Refill  . amLODipine (NORVASC) 5 MG tablet Take 1 tablet (5 mg total) by mouth daily. 90 tablet 3  . aspirin 81 MG tablet Take 1 tablet by mouth daily. Reported on 09/02/2015    . atorvastatin (LIPITOR) 40 MG tablet TAKE ONE TABLET BY MOUTH ONCE DAILY 90 tablet 3  . calcitRIOL (ROCALTROL) 0.25 MCG capsule Take 0.25 mcg by mouth daily.    . Cyanocobalamin (VITAMIN B 12 PO) Take by mouth daily.    . finasteride (PROSCAR) 5 MG tablet TAKE 1 TABLET BY MOUTH ONCE DAILY 90 tablet 3  . fluticasone (FLONASE) 50 MCG/ACT nasal spray Place 2 sprays into both nostrils daily. 16 g  5  . hydrALAZINE (APRESOLINE) 50 MG tablet Take 50 mg by mouth 3 (three) times daily.    Marland Kitchen lisinopril (PRINIVIL,ZESTRIL) 40 MG tablet Take 1 tablet (40 mg total) by mouth daily. 90 tablet 3  . metoprolol tartrate (LOPRESSOR) 50 MG tablet Take 1 tablet (50 mg total) by mouth 2 (two) times daily. 180 tablet 3  . mycophenolate (CELLCEPT) 500 MG tablet Take 1,000 mg by mouth 2 (two) times daily.  3  . predniSONE (DELTASONE) 10 MG tablet Take 10 mg by mouth daily.  3  . tamsulosin (FLOMAX) 0.4 MG CAPS capsule Take 1 capsule (0.4 mg total) by mouth daily. 90 capsule 3   No current facility-administered medications for this visit.      PHYSICAL EXAMINATION: ECOG PERFORMANCE STATUS: 1 - Symptomatic but completely ambulatory Vitals:   11/21/17 1126  BP: 119/64  Pulse: (!) 56  Temp: (!) 97 F (36.1 C)   Filed Weights  11/21/17 1126  Weight: 188 lb 4.4 oz (85.4 kg)    Physical Exam  Constitutional: He is oriented to person, place, and time. He appears well-developed and well-nourished. No distress.  HENT:  Head: Normocephalic and atraumatic.  Right Ear: External ear normal.  Left Ear: External ear normal.  Mouth/Throat: Oropharynx is clear and moist.  Eyes: Pupils are equal, round, and reactive to light. Conjunctivae and EOM are normal. No scleral icterus.  Pale conjuctivae  Neck: Normal range of motion. Neck supple.  Cardiovascular: Normal rate, regular rhythm and normal heart sounds.  Pulmonary/Chest: Effort normal and breath sounds normal. No respiratory distress. He has no wheezes. He has no rales. He exhibits no tenderness.  Abdominal: Soft. Bowel sounds are normal. He exhibits no distension and no mass. There is no tenderness.  Musculoskeletal: Normal range of motion. He exhibits no edema or deformity.  Bilateral trace edema.   Lymphadenopathy:    He has no cervical adenopathy.  Neurological: He is alert and oriented to person, place, and time. No cranial nerve deficit.  Coordination normal.  Skin: Skin is warm and dry. No rash noted.  Psychiatric: He has a normal mood and affect. His behavior is normal. Thought content normal.     LABORATORY DATA:  I have reviewed the data as listed Lab Results  Component Value Date   WBC 7.9 11/19/2017   HGB 7.9 (L) 11/19/2017   HCT 23.9 (L) 11/19/2017   MCV 92.3 11/19/2017   PLT 339 11/19/2017   Recent Labs    06/03/17 0909 07/29/17 0855 08/01/17 0448  NA 144 138 143  K 5.1 4.4 4.5  CL 108* 108 117*  CO2 '20 22 25  '$ GLUCOSE 79 103* 112*  BUN 25 34* 36*  CREATININE 1.76* 1.96* 1.92*  CALCIUM 8.6 8.7* 8.3*  GFRNONAA 36* 31* 32*  GFRAA 42* 36* 37*  PROT 5.6* 6.2*  --   ALBUMIN 3.5 2.9*  --   AST 16 19  --   ALT 14 16*  --   ALKPHOS 151* 124  --   BILITOT 0.3 0.6  --        ASSESSMENT & PLAN:  1. FSGS (focal segmental glomerulosclerosis)   2. Anemia in chronic kidney disease, unspecified CKD stage   3. Renal mass   4. Other fatigue    #Anemia:  Hemoglobin decreased. Labs were reviewed by me and discussed with patient.  Normal  iron TIBC, ferritin, folate, high vitamin B12, no M protein spike on multiple myeloma  Anemia is most likely due to combination of CKD and use of cellcept.   Discussed with patient about the rationale of erythropoietin therapy and side effects including not limited to thrombosis events, stroke, heart attack, progression of underlying cancer, etc. Patient understands and willing to proceed.   # Renal mass, follows up with Dr.Brandon. Previous negative biopsy [place] of kidney on 11/18/2017  was Independently reviewed by me and discussed with patient. US showed stable left kidney mass, 5.1cm.  I communicated with Dr.Brandon who cleared patient to start procrit therapy.  # Fatigue: worsened due to worsened anemia. hopefully can improved after anemia improves. Monitor.   All questions were answered. The patient knows to call the clinic with any problems questions or  concerns.  Return of visit: 4 weeks    Earlie Server, MD, Ph D Hematology Oncology [facility] at [facility] Pager- [phone] 11/21/2017

## 2017-11-22 ENCOUNTER — Encounter: Payer: Self-pay | Admitting: Urology

## 2017-11-22 ENCOUNTER — Ambulatory Visit (INDEPENDENT_AMBULATORY_CARE_PROVIDER_SITE_OTHER): Payer: Medicare Other | Admitting: Urology

## 2017-11-22 VITALS — BP 133/60 | HR 64 | Ht 72.0 in | Wt 186.0 lb

## 2017-11-22 DIAGNOSIS — N2889 Other specified disorders of kidney and ureter: Secondary | ICD-10-CM

## 2017-11-22 NOTE — Progress Notes (Signed)
11/22/2017 8:53 AM   Arthur Bowen 09/10/1940 [id number]  Referring provider: Glean Hess, MD [address] [place], [place] [address]  Chief Complaint  Patient presents with  . renal mass    HPI: 77 year old male who returns today for routine follow-up of left renal mass.  Since last visit, he is been diagnosed with FSGS, most recent creatinine 2.11, GFR 29 under the care of Dr. Holley Raring.  He is currently being followed for a stable complex left renal cyst which is been notably present on CT scan since at least 2014.  At the time that this was discovered, he underwent biopsy of lesion which revealed mostly blood and hemosiderin laden macrophages consistent with hemorrhagic cyst. There was no malignant cells identified. At the time of diagnosis, the mass measured approximately 4.5 cm.   Most recent cross-sectional imaging in 08/2015 measuring the lesion 2.9 x 5 cm.  Follow-up renal ultrasound today shows a 5.1 cm lesion arising from the left mid upper pole and appears to be essentially stable.  He denies any flank pain or gross hematuria.  He does note that he has had several family members die of FSGS.  He is anticipating that he will need dialysis in the future.  He does have a personal history of phimosis status post office circumcision.  He is doing well from this without recurrence.   PMH: Past Medical History:  Diagnosis Date  . Acquired phimosis   . BPH (benign prostatic hypertrophy)   . Chronic kidney disease   . Dyspnea    with exertion  . Elevated PSA   . Gross hematuria   . Hematuria   . History of hiatal hernia   . Hypertension   . 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 Renal mass, left     Surgical History: Past Surgical History:  Procedure Laterality Date  . Circumscision  09/2015  . CORONARY ARTERY BYPASS GRAFT  2002   x3 @ [facility]  . INGUINAL HERNIA REPAIR Left 2011  . RENAL BIOPSY  2014   neg in w/u renal mass/hematuria     Home Medications:  Allergies as of 11/22/2017      Reactions   Penicillins Other (See Comments)   Sulfa Antibiotics Other (See Comments)      Medication List        Accurate as of 11/22/17 11:59 PM. Always use your most recent med list.          amLODipine 5 MG tablet Commonly known as:  NORVASC Take 1 tablet (5 mg total) by mouth daily.   aspirin 81 MG tablet Take 1 tablet by mouth daily. Reported on 09/02/2015   atorvastatin 40 MG tablet Commonly known as:  LIPITOR TAKE ONE TABLET BY MOUTH ONCE DAILY   calcitRIOL 0.25 MCG capsule Commonly known as:  ROCALTROL Take 0.25 mcg by mouth daily.   finasteride 5 MG tablet Commonly known as:  PROSCAR TAKE 1 TABLET BY MOUTH ONCE DAILY   fluticasone 50 MCG/ACT nasal spray Commonly known as:  FLONASE Place 2 sprays into both nostrils daily.   hydrALAZINE 50 MG tablet Commonly known as:  APRESOLINE Take 50 mg by mouth 3 (three) times daily.   lisinopril 40 MG tablet Commonly known as:  PRINIVIL,ZESTRIL Take 1 tablet (40 mg total) by mouth daily.   metoprolol tartrate 50 MG tablet Commonly known as:  LOPRESSOR Take 1 tablet (50 mg total) by mouth 2 (two) times daily.  mycophenolate 500 MG tablet Commonly known as:  CELLCEPT Take 1,000 mg by mouth 2 (two) times daily.   tamsulosin 0.4 MG Caps capsule Commonly known as:  FLOMAX Take 1 capsule (0.4 mg total) by mouth daily.   VITAMIN B 12 PO Take by mouth daily.       Allergies:  Allergies  Allergen Reactions  . Penicillins Other (See Comments)  . Sulfa Antibiotics Other (See Comments)    Family History: Family History  Problem Relation Age of Onset  . Lymphoma Mother   . Hodgkin's lymphoma Mother   . Lung cancer Father   . Heart disease Brother   . Dementia Sister   . Dementia Brother   . Breast cancer Maternal Aunt     Social History:  reports that he quit smoking about 6 weeks ago. His smoking use included cigarettes. He has a 25.00 pack-year  smoking history. He has quit using smokeless tobacco. His smokeless tobacco use included chew. He reports that he does not drink alcohol or use drugs.  ROS: UROLOGY Frequent Urination?: No Hard to postpone urination?: No Burning/pain with urination?: No Get up at night to urinate?: No Leakage of urine?: No Urine stream starts and stops?: No Trouble starting stream?: No Do you have to strain to urinate?: No Blood in urine?: No Urinary tract infection?: No Sexually transmitted disease?: No Injury to kidneys or bladder?: No Painful intercourse?: No Weak stream?: No Erection problems?: No Penile pain?: No  Gastrointestinal Nausea?: No Vomiting?: No Indigestion/heartburn?: No Diarrhea?: No Constipation?: No  Constitutional Fever: No Night sweats?: No Weight loss?: No Fatigue?: No  Skin Skin rash/lesions?: No Itching?: No  Eyes Blurred vision?: No Double vision?: No  Ears/Nose/Throat Sore throat?: No Sinus problems?: No  Hematologic/Lymphatic Swollen glands?: No Easy bruising?: No  Cardiovascular Leg swelling?: No Chest pain?: No  Respiratory Cough?: No Shortness of breath?: No  Endocrine Excessive thirst?: No  Musculoskeletal Back pain?: No Joint pain?: No  Neurological Headaches?: No Dizziness?: No  Psychologic Depression?: No Anxiety?: No  Physical Exam: BP 133/60   Pulse 64   Ht 6' (1.829 m)   Wt 186 lb (84.4 kg)   BMI 25.23 kg/m   Constitutional:  Alert and oriented, No acute distress.  Accompanied by daughter today. HEENT: [place] AT, moist mucus membranes.  Trachea midline, no masses. Cardiovascular: No clubbing, cyanosis, or edema. Respiratory: Normal respiratory effort, no increased work of breathing. Skin: No rashes, bruises or suspicious lesions. Neurologic: Grossly intact, no focal deficits, moving all 4 extremities. Psychiatric: Normal mood and affect.  Laboratory Data: Lab Results  Component Value Date   WBC 7.9 11/19/2017     HGB 7.9 (L) 11/19/2017   HCT 23.9 (L) 11/19/2017   MCV 92.3 11/19/2017   PLT 339 11/19/2017    Lab Results  Component Value Date   CREATININE 1.92 (H) 08/01/2017    Urinalysis N/a  Pertinent Imaging: Results for orders placed during the hospital encounter of 11/18/17  US RENAL   Narrative CLINICAL DATA:  Renal mass.  EXAM: RENAL / URINARY TRACT ULTRASOUND COMPLETE  COMPARISON:  Ultrasound of April 02, 2017 and August 08, 2015. CT scan of August 24, 2015.  FINDINGS: Right Kidney:  Length: 11.6 cm. Multiple cysts are noted in the upper poles, with the largest measuring 3.5 cm. Increased echogenicity of renal parenchyma. No mass or hydronephrosis visualized.  Left Kidney:  Length: 12.5 cm. Increased echogenicity of renal parenchyma is noted. No hydronephrosis visualized. 5.1 cm predominantly solid mass  is seen arising from mid to upper pole which is not significantly changed compared to prior exam. 4.2 cm simple cyst is seen arising from upper pole.  Bladder:  Appears normal for degree of bladder distention.  IMPRESSION: 5.1 cm predominantly solid mass is seen arising from mid to upper pole of left kidney which is not significantly changed compared to prior exam. Bilateral simple renal cysts are noted.  Increased echogenicity of renal parenchyma is noted bilaterally suggesting medical renal disease.   Electronically Signed   By: Marijo Conception, M.D.   On: 11/18/2017 10:17    Renal ultrasound personally reviewed, compared to previous scans which appears to be stable.  Assessment & Plan:    1. Renal mass Left renal mass, likely benign Minimal change since 2014 Previous negative biopsy reassuring  We will continue to follow with annual surveillance imaging In light of recent onset of FSGS and progressive renal insufficiency, may consider nephrectomy down the road if the patient ultimately ends up on dialysis and the lesion begins to grow  - US  RENAL; Future   Return in about 1 year (around 11/23/2018) for RUS.  Hollice Espy, MD  [facility] [address], [place], [place] [address] [phone]

## 2017-11-24 ENCOUNTER — Other Ambulatory Visit: Payer: Self-pay | Admitting: Internal Medicine

## 2017-11-24 DIAGNOSIS — E785 Hyperlipidemia, unspecified: Secondary | ICD-10-CM

## 2017-11-25 ENCOUNTER — Other Ambulatory Visit: Payer: Medicare Other

## 2017-11-28 ENCOUNTER — Ambulatory Visit: Payer: Medicare Other | Admitting: Oncology

## 2017-11-28 ENCOUNTER — Ambulatory Visit: Payer: Medicare Other

## 2017-12-02 ENCOUNTER — Encounter: Payer: Self-pay | Admitting: Internal Medicine

## 2017-12-02 ENCOUNTER — Ambulatory Visit (INDEPENDENT_AMBULATORY_CARE_PROVIDER_SITE_OTHER): Payer: Medicare Other | Admitting: Internal Medicine

## 2017-12-02 VITALS — BP 108/60 | HR 85 | Temp 98.1°F | Resp 16 | Ht 72.0 in | Wt 185.0 lb

## 2017-12-02 DIAGNOSIS — N184 Chronic kidney disease, stage 4 (severe): Secondary | ICD-10-CM

## 2017-12-02 DIAGNOSIS — I10 Essential (primary) hypertension: Secondary | ICD-10-CM | POA: Diagnosis not present

## 2017-12-02 DIAGNOSIS — I129 Hypertensive chronic kidney disease with stage 1 through stage 4 chronic kidney disease, or unspecified chronic kidney disease: Secondary | ICD-10-CM | POA: Diagnosis not present

## 2017-12-02 DIAGNOSIS — I739 Peripheral vascular disease, unspecified: Secondary | ICD-10-CM | POA: Diagnosis not present

## 2017-12-02 DIAGNOSIS — R29898 Other symptoms and signs involving the musculoskeletal system: Secondary | ICD-10-CM

## 2017-12-02 NOTE — Progress Notes (Signed)
Date:  12/02/2017   Name:  Arthur Bowen   DOB:  04-03-1941   MRN:  [id number]   Chief Complaint: Hypertension and Extremity Weakness (possible medication ) Hypertension  This is a chronic problem. The problem is unchanged. Pertinent negatives include no chest pain, palpitations or shortness of breath. Past treatments include ACE inhibitors and beta blockers.  Extremity Weakness    Leg weakness started after starting Cellcept.  His appetite is good.  He has lost a few pounds.  He has fallen once carrying groceries up the 8 steps into his house with no injury. He will see Dr. Holley Raring next week for follow up of renal disease.    Review of Systems  Constitutional: Positive for fatigue. Negative for appetite change and diaphoresis.  Respiratory: Negative for chest tightness, shortness of breath and wheezing.   Cardiovascular: Negative for chest pain, palpitations and leg swelling.  Musculoskeletal: Positive for extremity weakness.  Neurological: Positive for weakness. Negative for dizziness.  Psychiatric/Behavioral: Negative for sleep disturbance.    Patient Active Problem List   Diagnosis Date Noted  . Anemia in chronic kidney disease 11/21/2017  . Secondary hyperparathyroidism of renal origin ([place]) 10/15/2017  . 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 Primary osteoarthritis of right knee 01/09/2017  . Elevated PSA 08/14/2015  . Renal mass 08/14/2015  . BPH with obstruction/lower urinary tract symptoms 08/14/2015  . History of hematuria 08/14/2015  . CAD in native artery 04/06/2015  . Dyslipidemia 04/06/2015  . Essential (primary) hypertension 04/06/2015  . Neuropathy 04/06/2015  . Peripheral vascular disease ([place]) 04/06/2015  . 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 Senile purpura ([place]) 04/06/2015    Prior to Admission medications   Medication Sig Start Date End Date Taking? Authorizing Provider  amLODipine (NORVASC) 5 MG tablet Take 1 tablet (5  mg total) by mouth daily. 06/03/17  Yes Glean Hess, MD  aspirin 81 MG tablet Take 1 tablet by mouth daily. Reported on 09/02/2015   Yes [provider]  atorvastatin (LIPITOR) 40 MG tablet TAKE 1 TABLET BY MOUTH ONCE DAILY 11/24/17  Yes Glean Hess, MD  calcitRIOL (ROCALTROL) 0.25 MCG capsule Take 0.25 mcg by mouth daily.   Yes [provider]  Cyanocobalamin (VITAMIN B 12 PO) Take by mouth daily.   Yes [provider]  finasteride (PROSCAR) 5 MG tablet TAKE 1 TABLET BY MOUTH ONCE DAILY 09/13/17  Yes Glean Hess, MD  fluticasone [facility]) 50 MCG/ACT nasal spray Place 2 sprays into both nostrils daily. 10/24/15  Yes Glean Hess, MD  hydrALAZINE (APRESOLINE) 50 MG tablet Take 50 mg by mouth 3 (three) times daily.   Yes [provider]  lisinopril (PRINIVIL,ZESTRIL) 40 MG tablet Take 1 tablet (40 mg total) by mouth daily. 01/09/17  Yes Glean Hess, MD  metoprolol tartrate (LOPRESSOR) 50 MG tablet Take 1 tablet (50 mg total) by mouth 2 (two) times daily. 06/03/17  Yes Glean Hess, MD  mycophenolate (CELLCEPT) 500 MG tablet Take 1,000 mg by mouth 2 (two) times daily. 09/29/17  Yes [provider]    Allergies  Allergen Reactions  . Penicillins Other (See Comments)  . Sulfa Antibiotics Other (See Comments)    Past Surgical History:  Procedure Laterality Date  . Circumscision  09/2015  . CORONARY ARTERY BYPASS GRAFT  2002   x3 @ [facility]  . INGUINAL HERNIA REPAIR Left 2011  . RENAL BIOPSY  2014   neg in  w/u renal mass/hematuria    Social History   Tobacco Use  . Smoking status: Former Smoker    Packs/day: 0.50    Years: 50.00    Pack years: 25.00    Types: Cigarettes    Last attempt to quit: 10/10/2017    Years since quitting: 0.1  . Smokeless tobacco: Former Systems developer    Types: Chew  . Tobacco comment: last cig 10/10/17   Substance Use Topics  . Alcohol use: No    Alcohol/week: 0.0 oz  . Drug use: No      Medication list has been reviewed and updated.  Current Meds  Medication Sig  . amLODipine (NORVASC) 5 MG tablet Take 1 tablet (5 mg total) by mouth daily.  Marland Kitchen aspirin 81 MG tablet Take 1 tablet by mouth daily. Reported on 09/02/2015  . atorvastatin (LIPITOR) 40 MG tablet TAKE 1 TABLET BY MOUTH ONCE DAILY  . calcitRIOL (ROCALTROL) 0.25 MCG capsule Take 0.25 mcg by mouth daily.  . Cyanocobalamin (VITAMIN B 12 PO) Take by mouth daily.  . finasteride (PROSCAR) 5 MG tablet TAKE 1 TABLET BY MOUTH ONCE DAILY  . fluticasone (FLONASE) 50 MCG/ACT nasal spray Place 2 sprays into both nostrils daily.  . hydrALAZINE (APRESOLINE) 50 MG tablet Take 50 mg by mouth 3 (three) times daily.  Marland Kitchen lisinopril (PRINIVIL,ZESTRIL) 40 MG tablet Take 1 tablet (40 mg total) by mouth daily.  . metoprolol tartrate (LOPRESSOR) 50 MG tablet Take 1 tablet (50 mg total) by mouth 2 (two) times daily.  . mycophenolate (CELLCEPT) 500 MG tablet Take 1,000 mg by mouth 2 (two) times daily.    PHQ 2/9 Scores 12/02/2017 05/30/2016 05/26/2015  PHQ - 2 Score 0 0 0    Physical Exam  Constitutional: He is oriented to person, place, and time. He appears well-developed. No distress.  HENT:  Head: Normocephalic and atraumatic.  Neck: Normal range of motion. Neck supple.  Cardiovascular: Normal rate, regular rhythm and normal heart sounds.  Pulses:      Dorsalis pedis pulses are 1+ on the right side, and 1+ on the left side.  Pulmonary/Chest: Effort normal and breath sounds normal. No respiratory distress.  Musculoskeletal: Normal range of motion. He exhibits no edema or tenderness.       Right knee: Normal.       Left knee: Normal.  No muscle tenderness to palpation LEs No cord or calf swelling   Lymphadenopathy:    He has no cervical adenopathy.  Neurological: He is alert and oriented to person, place, and time. Gait normal.  Able to get on and off the exam table without difficulty  Skin: Skin is warm, dry and intact. No  rash noted.  Psychiatric: He has a normal mood and affect. His behavior is normal. Thought content normal.  Nursing note and vitals reviewed.   BP 108/60   Pulse 85   Temp 98.1 F (36.7 C) (Oral)   Resp 16   Wt 185 lb (83.9 kg)   SpO2 100%   BMI 25.09 kg/m   Assessment and Plan: 1. Essential (primary) hypertension controlled  2. Peripheral vascular disease (HCC) Stable without ulcerations  3. CKD stage 4 secondary to hypertension ([place]) Now on cellcept for renal disease - discuss possible side effects with Nephrology next week  4. Leg weakness, bilateral Possible side effect to medications.   No orders of the defined types were placed in this encounter.   Partially dictated using Editor, commissioning. Any errors are unintentional.  Mickel Baas  Army Melia, MD [facility]  12/02/2017   There are no diagnoses linked to this encounter.

## 2017-12-12 DIAGNOSIS — R809 Proteinuria, unspecified: Secondary | ICD-10-CM | POA: Diagnosis not present

## 2017-12-12 DIAGNOSIS — N183 Chronic kidney disease, stage 3 (moderate): Secondary | ICD-10-CM | POA: Diagnosis not present

## 2017-12-12 DIAGNOSIS — I10 Essential (primary) hypertension: Secondary | ICD-10-CM | POA: Diagnosis not present

## 2017-12-12 DIAGNOSIS — N184 Chronic kidney disease, stage 4 (severe): Secondary | ICD-10-CM | POA: Diagnosis not present

## 2017-12-12 DIAGNOSIS — N2581 Secondary hyperparathyroidism of renal origin: Secondary | ICD-10-CM | POA: Diagnosis not present

## 2017-12-12 DIAGNOSIS — N041 Nephrotic syndrome with focal and segmental glomerular lesions: Secondary | ICD-10-CM | POA: Diagnosis not present

## 2017-12-12 DIAGNOSIS — D631 Anemia in chronic kidney disease: Secondary | ICD-10-CM | POA: Diagnosis not present

## 2017-12-26 ENCOUNTER — Inpatient Hospital Stay: Payer: Medicare Other

## 2017-12-26 ENCOUNTER — Encounter: Payer: Self-pay | Admitting: Oncology

## 2017-12-26 ENCOUNTER — Inpatient Hospital Stay: Payer: Medicare Other | Attending: Oncology | Admitting: Oncology

## 2017-12-26 ENCOUNTER — Other Ambulatory Visit: Payer: Self-pay

## 2017-12-26 VITALS — BP 149/57 | HR 54 | Temp 96.5°F | Wt 188.3 lb

## 2017-12-26 DIAGNOSIS — D631 Anemia in chronic kidney disease: Secondary | ICD-10-CM

## 2017-12-26 DIAGNOSIS — D649 Anemia, unspecified: Secondary | ICD-10-CM

## 2017-12-26 DIAGNOSIS — N189 Chronic kidney disease, unspecified: Principal | ICD-10-CM

## 2017-12-26 DIAGNOSIS — I129 Hypertensive chronic kidney disease with stage 1 through stage 4 chronic kidney disease, or unspecified chronic kidney disease: Secondary | ICD-10-CM

## 2017-12-26 DIAGNOSIS — N184 Chronic kidney disease, stage 4 (severe): Secondary | ICD-10-CM

## 2017-12-26 LAB — CBC WITH DIFFERENTIAL/PLATELET
BASOS ABS: 0.1 10*3/uL (ref 0–0.1)
BASOS PCT: 1 %
EOS ABS: 0.1 10*3/uL (ref 0–0.7)
Eosinophils Relative: 1 %
HCT: 24.4 % — ABNORMAL LOW (ref 40.0–52.0)
HEMOGLOBIN: 7.8 g/dL — AB (ref 13.0–18.0)
LYMPHS ABS: 2.4 10*3/uL (ref 1.0–3.6)
Lymphocytes Relative: 29 %
MCH: 30.5 pg (ref 26.0–34.0)
MCHC: 32.1 g/dL (ref 32.0–36.0)
MCV: 95.1 fL (ref 80.0–100.0)
Monocytes Absolute: 0.5 10*3/uL (ref 0.2–1.0)
Monocytes Relative: 7 %
NEUTROS PCT: 62 %
Neutro Abs: 5.1 10*3/uL (ref 1.4–6.5)
Platelets: 285 10*3/uL (ref 150–440)
RBC: 2.57 MIL/uL — AB (ref 4.40–5.90)
RDW: 15.9 % — ABNORMAL HIGH (ref 11.5–14.5)
WBC: 8.1 10*3/uL (ref 3.8–10.6)

## 2017-12-26 MED ORDER — EPOETIN ALFA 20000 UNIT/ML IJ SOLN
50000.0000 [IU] | Freq: Once | INTRAMUSCULAR | Status: AC
  Administered 2017-12-26: 50000 [IU] via SUBCUTANEOUS

## 2017-12-26 NOTE — Progress Notes (Signed)
Patient here today for follow up.   

## 2017-12-26 NOTE — Progress Notes (Addendum)
Hematology/Oncology Follow up note [facility] [phone] [phone]   Patient Care Team: Glean Hess, MD as PCP - General (Family Medicine) Hollice Espy, MD as Consulting Physician (Urology) Anthonette Legato, MD (Nephrology)  REFERRING PROVIDER: Dr.Lateef REASON FOR VISIT Follow up for treatment of anemia of CKD  HISTORY OF PRESENTING ILLNESS:  Arthur Bowen is a  77 y.o.  male with PMH listed below who was referred to me for evaluation of anemia of CKD.  Patient also has left renal mass which is being followed by urology.  He had recent labs done at the nephrologist office on October 15, 2017 which showed WBC 9.2, hemoglobin 9.1, MCV 89, platelet counts 401 thousand, normal differential.  Creatinine 2.11,  Reports feeling tired.Denies hematochezia, hematuria, hematemesis, epistaxis, black tarry stool or easy bruising.  Last colonoscopy: never had colonoscopy.  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 Renal mass, follows up with Dr.Brandon. Previous negative biopsy [place] of kidney on 11/18/2017  was Independently reviewed by me and discussed with patient. US showed stable left kidney mass, 5.1cm.  I communicated with Dr.Brandon who cleared patient to start procrit therapy.  INTERVAL HISTORY Arthur Bowen is a 77 y.o. male who has above history reviewed by me presents for follow-up visit for management of anemia.   Patient received 40,000 units of Procrit 1 month ago.  Reports tolerating well no side effects.  Problems and complaints are listed below: Anemia: Chronic, stable.  Today's hemoglobin at 7.8.    Fatigue: Stable, not better or worse. FSGS: Follows up with nephrologist.  Patient is on CellCept. SOB: At baseline, chronic.  Review of Systems  Constitutional: Positive for malaise/fatigue. Negative for chills, fever and weight loss.  HENT: Negative for congestion, ear discharge, ear pain, nosebleeds, sinus pain and sore throat.   Eyes:  Negative for double vision, photophobia, pain, discharge and redness.  Respiratory: Negative for cough, hemoptysis, sputum production, shortness of breath and wheezing.   Cardiovascular: Negative for chest pain, palpitations, orthopnea, claudication and leg swelling.  Gastrointestinal: Negative for abdominal pain, blood in stool, constipation, diarrhea, heartburn, melena, nausea and vomiting.  Genitourinary: Negative for dysuria, flank pain, frequency and hematuria.  Musculoskeletal: Negative for back pain, myalgias and neck pain.  Skin: Negative for itching and rash.  Neurological: Negative for dizziness, tingling, tremors, focal weakness, weakness and headaches.  Endo/Heme/Allergies: Negative for environmental allergies. Does not bruise/bleed easily.  Psychiatric/Behavioral: Negative for depression and hallucinations. The patient is not nervous/anxious.     MEDICAL HISTORY:  Past Medical History:  Diagnosis Date  . Acquired phimosis   . BPH (benign prostatic hypertrophy)   . Chronic kidney disease   . Dyspnea    with exertion  . Elevated PSA   . Gross hematuria   . Hematuria   . History of hiatal hernia   . Hypertension   . 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 Renal mass, left     SURGICAL HISTORY: Past Surgical History:  Procedure Laterality Date  . Circumscision  09/2015  . CORONARY ARTERY BYPASS GRAFT  2002   x3 @ [place]  . INGUINAL HERNIA REPAIR Left 2011  . RENAL BIOPSY  2014   neg in w/u renal mass/hematuria    SOCIAL HISTORY: Social History   Socioeconomic History  . Marital status: Divorced    Spouse name: Not on file  . Number of children: 2  . Years of education: Not on file  . Highest education  level: Not on file  Occupational History  . Not on file  Social Needs  . Financial resource strain: Patient refused  . Food insecurity:    Worry: Patient refused    Inability: Patient refused  . Transportation needs:    Medical: Patient refused      Non-medical: Patient refused  Tobacco Use  . Smoking status: Former Smoker    Packs/day: 0.50    Years: 50.00    Pack years: 25.00    Types: Cigarettes    Last attempt to quit: 10/10/2017    Years since quitting: 0.2  . Smokeless tobacco: Former Systems developer    Types: Chew  . Tobacco comment: last cig 10/10/17   Substance and Sexual Activity  . Alcohol use: No    Alcohol/week: 0.0 oz  . Drug use: No  . Sexual activity: Not on file  Lifestyle  . Physical activity:    Days per week: Patient refused    Minutes per session: Patient refused  . Stress: Patient refused  Relationships  . Social connections:    Talks on phone: Patient refused    Gets together: Patient refused    Attends religious service: Patient refused    Active member of club or organization: Patient refused    Attends meetings of clubs or organizations: Patient refused    Relationship status: Patient refused  . Intimate partner violence:    Fear of current or ex partner: Patient refused    Emotionally abused: Patient refused    Physically abused: Patient refused    Forced sexual activity: Patient refused  Other Topics Concern  . Not on file  Social History Narrative   Patient refused all questions- 06/03/2017    FAMILY HISTORY: Family History  Problem Relation Age of Onset  . Lymphoma Mother   . Hodgkin's lymphoma Mother   . Lung cancer Father   . Heart disease Brother   . Dementia Sister   . Dementia Brother   . Breast cancer Maternal Aunt     ALLERGIES:  is allergic to penicillins and sulfa antibiotics.  MEDICATIONS:  Current Outpatient Medications  Medication Sig Dispense Refill  . amLODipine (NORVASC) 5 MG tablet Take 1 tablet (5 mg total) by mouth daily. 90 tablet 3  . aspirin 81 MG tablet Take 1 tablet by mouth daily. Reported on 09/02/2015    . atorvastatin (LIPITOR) 40 MG tablet TAKE 1 TABLET BY MOUTH ONCE DAILY 90 tablet 3  . calcitRIOL (ROCALTROL) 0.25 MCG capsule Take 0.25 mcg by mouth  daily.    . Cyanocobalamin (VITAMIN B 12 PO) Take by mouth daily.    . finasteride (PROSCAR) 5 MG tablet TAKE 1 TABLET BY MOUTH ONCE DAILY 90 tablet 3  . fluticasone (FLONASE) 50 MCG/ACT nasal spray Place 2 sprays into both nostrils daily. 16 g 5  . hydrALAZINE (APRESOLINE) 50 MG tablet Take 50 mg by mouth 3 (three) times daily.    Marland Kitchen lisinopril (PRINIVIL,ZESTRIL) 40 MG tablet Take 1 tablet (40 mg total) by mouth daily. 90 tablet 3  . metoprolol tartrate (LOPRESSOR) 50 MG tablet Take 1 tablet (50 mg total) by mouth 2 (two) times daily. 180 tablet 3  . mycophenolate (CELLCEPT) 500 MG tablet Take 1,000 mg by mouth 2 (two) times daily.  3   No current facility-administered medications for this visit.      PHYSICAL EXAMINATION: ECOG PERFORMANCE STATUS: 1 - Symptomatic but completely ambulatory Vitals:   12/26/17 1059  BP: (!) 149/57  Pulse: (!) 54  Temp: (!) 96.5 F (35.8 C)   Filed Weights   12/26/17 1059  Weight: 188 lb 4.4 oz (85.4 kg)    Physical Exam  Constitutional: He is oriented to person, place, and time. He appears well-developed and well-nourished. No distress.  HENT:  Head: Normocephalic and atraumatic.  Right Ear: External ear normal.  Left Ear: External ear normal.  Mouth/Throat: Oropharynx is clear and moist.  Eyes: Pupils are equal, round, and reactive to light. Conjunctivae and EOM are normal. No scleral icterus.  Pale conjuctivae  Neck: Normal range of motion. Neck supple.  Cardiovascular: Normal rate, regular rhythm and normal heart sounds.  Pulmonary/Chest: Effort normal and breath sounds normal. No respiratory distress. He has no wheezes. He has no rales. He exhibits no tenderness.  Abdominal: Soft. Bowel sounds are normal. He exhibits no distension and no mass. There is no tenderness.  Musculoskeletal: Normal range of motion. He exhibits no edema or deformity.  Bilateral trace edema.   Lymphadenopathy:    He has no cervical adenopathy.  Neurological: He is  alert and oriented to person, place, and time. No cranial nerve deficit. Coordination normal.  Skin: Skin is warm and dry. No rash noted.  Psychiatric: He has a normal mood and affect. His behavior is normal. Thought content normal.     LABORATORY DATA:  I have reviewed the data as listed Lab Results  Component Value Date   WBC 7.9 11/19/2017   HGB 7.9 (L) 11/19/2017   HCT 23.9 (L) 11/19/2017   MCV 92.3 11/19/2017   PLT 339 11/19/2017   Recent Labs    06/03/17 0909 07/29/17 0855 08/01/17 0448  NA 144 138 143  K 5.1 4.4 4.5  CL 108* 108 117*  CO2 20 22 25   GLUCOSE 79 103* 112*  BUN 25 34* 36*  CREATININE 1.76* 1.96* 1.92*  CALCIUM 8.6 8.7* 8.3*  GFRNONAA 36* 31* 32*  GFRAA 42* 36* 37*  PROT 5.6* 6.2*  --   ALBUMIN 3.5 2.9*  --   AST 16 19  --   ALT 14 16*  --   ALKPHOS 151* 124  --   BILITOT 0.3 0.6  --        ASSESSMENT & PLAN:  1. Anemia in chronic kidney disease, unspecified CKD stage   2. Anemia, unspecified type    #Anemia: Due to combination of CKD and use of CellCept. Increase Procrit 25%, will proceed with 50,000 units today. #Patient never had colonoscopy is not currently interested.  Will check stool occult.  Monitor iron panel  All questions were answered. The patient knows to call the clinic with any problems questions or concerns.  Return of visit: 4 weeks  Earlie Server, MD, Ph D Hematology Oncology [facility] Pager- [phone] 12/26/2017

## 2018-01-21 ENCOUNTER — Inpatient Hospital Stay: Payer: Medicare Other | Attending: Oncology

## 2018-01-21 DIAGNOSIS — D631 Anemia in chronic kidney disease: Secondary | ICD-10-CM | POA: Diagnosis not present

## 2018-01-21 DIAGNOSIS — N189 Chronic kidney disease, unspecified: Secondary | ICD-10-CM | POA: Diagnosis not present

## 2018-01-21 LAB — CBC WITH DIFFERENTIAL/PLATELET
Basophils Absolute: 0.1 10*3/uL (ref 0–0.1)
Basophils Relative: 1 %
EOS ABS: 0.1 10*3/uL (ref 0–0.7)
EOS PCT: 1 %
HCT: 28.3 % — ABNORMAL LOW (ref 40.0–52.0)
Hemoglobin: 9.1 g/dL — ABNORMAL LOW (ref 13.0–18.0)
LYMPHS ABS: 1.7 10*3/uL (ref 1.0–3.6)
LYMPHS PCT: 20 %
MCH: 30.7 pg (ref 26.0–34.0)
MCHC: 32.2 g/dL (ref 32.0–36.0)
MCV: 95.1 fL (ref 80.0–100.0)
MONO ABS: 0.6 10*3/uL (ref 0.2–1.0)
MONOS PCT: 7 %
Neutro Abs: 6.2 10*3/uL (ref 1.4–6.5)
Neutrophils Relative %: 71 %
PLATELETS: 311 10*3/uL (ref 150–440)
RBC: 2.98 MIL/uL — AB (ref 4.40–5.90)
RDW: 16.4 % — ABNORMAL HIGH (ref 11.5–14.5)
WBC: 8.6 10*3/uL (ref 3.8–10.6)

## 2018-01-21 LAB — FERRITIN: FERRITIN: 217 ng/mL (ref 24–336)

## 2018-01-21 LAB — IRON AND TIBC
IRON: 94 ug/dL (ref 45–182)
Saturation Ratios: 39 % (ref 17.9–39.5)
TIBC: 244 ug/dL — AB (ref 250–450)
UIBC: 150 ug/dL

## 2018-01-23 ENCOUNTER — Inpatient Hospital Stay: Payer: Medicare Other

## 2018-01-23 ENCOUNTER — Other Ambulatory Visit: Payer: Self-pay

## 2018-01-23 ENCOUNTER — Encounter: Payer: Self-pay | Admitting: Oncology

## 2018-01-23 ENCOUNTER — Inpatient Hospital Stay (HOSPITAL_BASED_OUTPATIENT_CLINIC_OR_DEPARTMENT_OTHER): Payer: Medicare Other | Admitting: Oncology

## 2018-01-23 VITALS — BP 137/58 | HR 57 | Temp 96.2°F | Resp 16 | Wt 184.1 lb

## 2018-01-23 DIAGNOSIS — D631 Anemia in chronic kidney disease: Secondary | ICD-10-CM

## 2018-01-23 DIAGNOSIS — N184 Chronic kidney disease, stage 4 (severe): Secondary | ICD-10-CM

## 2018-01-23 DIAGNOSIS — N189 Chronic kidney disease, unspecified: Secondary | ICD-10-CM

## 2018-01-23 DIAGNOSIS — I129 Hypertensive chronic kidney disease with stage 1 through stage 4 chronic kidney disease, or unspecified chronic kidney disease: Secondary | ICD-10-CM

## 2018-01-23 MED ORDER — EPOETIN ALFA 10000 UNIT/ML IJ SOLN
10000.0000 [IU] | Freq: Once | INTRAMUSCULAR | Status: AC
  Administered 2018-01-23: 10000 [IU] via SUBCUTANEOUS

## 2018-01-23 MED ORDER — EPOETIN ALFA 40000 UNIT/ML IJ SOLN
40000.0000 [IU] | Freq: Once | INTRAMUSCULAR | Status: AC
  Administered 2018-01-23: 40000 [IU] via SUBCUTANEOUS

## 2018-01-23 MED ORDER — EPOETIN ALFA 20000 UNIT/ML IJ SOLN: 50000.0000 [IU] | Freq: Once | INTRAMUSCULAR | Status: DC

## 2018-01-23 NOTE — Progress Notes (Signed)
Hematology/Oncology Follow up note [facility] Telephone:([phone]) [phone] Fax:(336) [phone]   Patient Care Team: Juline Patch, MD as PCP - General (Family Medicine) Hollice Espy, MD as Consulting Physician (Urology) Anthonette Legato, MD (Nephrology)  REFERRING PROVIDER: Dr.Lateef REASON FOR VISIT Follow up for treatment of anemia of CKD  HISTORY OF PRESENTING ILLNESS:  Arthur Bowen is a  77 y.o.  male with PMH listed below who was referred to me for evaluation of anemia of CKD.  Patient also has left renal mass which is being followed by urology.  He had recent labs done at the nephrologist office on October 15, 2017 which showed WBC 9.2, hemoglobin 9.1, MCV 89, platelet counts 401 thousand, normal differential.  Creatinine 2.11,  Reports feeling tired.Denies hematochezia, hematuria, hematemesis, epistaxis, black tarry stool or easy bruising.  Last colonoscopy: never had colonoscopy.  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 Renal mass, follows up with Dr.Brandon. Previous negative biopsy [place] of kidney on 11/18/2017  was Independently reviewed by me and discussed with patient. US showed stable left kidney mass, 5.1cm.  I communicated with Dr.Brandon who cleared patient to start procrit therapy.  INTERVAL HISTORY Arthur Bowen is a 77 y.o. male who has above history reviewed by me presents  for follow-up visit the for management of anemia. Patient received a 50,000 unit of Procrit 4 weeks ago.  Reports tolerating well with no side effects. Problems and complaints are listed below: Anemia: Chronic.  Hemoglobin improved to 9.1 today.   Fatigue: Reports feeling stable level of fatigue, not better or worse. FSGS: Follows up with nephrologist.  Patient is on CellCept. SOB: At baseline stable.  Review of Systems  Constitutional: Positive for malaise/fatigue. Negative for chills, fever and weight loss.  HENT: Negative for congestion, ear discharge, ear pain, nosebleeds, sinus  pain and sore throat.   Eyes: Negative for double vision, photophobia, pain, discharge and redness.  Respiratory: Negative for cough, hemoptysis, sputum production, shortness of breath and wheezing.   Cardiovascular: Negative for chest pain, palpitations, orthopnea, claudication and leg swelling.  Gastrointestinal: Negative for abdominal pain, blood in stool, constipation, diarrhea, heartburn, melena, nausea and vomiting.  Genitourinary: Negative for dysuria, flank pain, frequency and hematuria.  Musculoskeletal: Negative for back pain, myalgias and neck pain.  Skin: Negative for itching and rash.  Neurological: Negative for dizziness, tingling, tremors, focal weakness, weakness and headaches.  Endo/Heme/Allergies: Negative for environmental allergies. Does not bruise/bleed easily.  Psychiatric/Behavioral: Negative for depression and hallucinations. The patient is not nervous/anxious.     MEDICAL HISTORY:  Past Medical History:  Diagnosis Date  . Acquired phimosis   . BPH (benign prostatic hypertrophy)   . Chronic kidney disease   . Dyspnea    with exertion  . Elevated PSA   . Gross hematuria   . Hematuria   . History of hiatal hernia   . Hypertension   . 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 Renal mass, left     SURGICAL HISTORY: Past Surgical History:  Procedure Laterality Date  . Circumscision  09/2015  . CORONARY ARTERY BYPASS GRAFT  2002   x3 @ [place]  . INGUINAL HERNIA REPAIR Left 2011  . RENAL BIOPSY  2014   neg in w/u renal mass/hematuria    SOCIAL HISTORY: Social History   Socioeconomic History  . Marital status: Divorced    Spouse name: Not on file  . Number of children: 2  . Years of education: Not on  file  . Highest education level: Not on file  Occupational History  . Not on file  Social Needs  . Financial resource strain: Patient refused  . Food insecurity:    Worry: Patient refused    Inability: Patient refused  . Transportation  needs:    Medical: Patient refused    Non-medical: Patient refused  Tobacco Use  . Smoking status: Former Smoker    Packs/day: 0.50    Years: 50.00    Pack years: 25.00    Types: Cigarettes    Last attempt to quit: 10/10/2017    Years since quitting: 0.2  . Smokeless tobacco: Former Systems developer    Types: Chew  . Tobacco comment: last cig 10/10/17   Substance and Sexual Activity  . Alcohol use: No    Alcohol/week: 0.0 standard drinks  . Drug use: No  . Sexual activity: Not on file  Lifestyle  . Physical activity:    Days per week: Patient refused    Minutes per session: Patient refused  . Stress: Patient refused  Relationships  . Social connections:    Talks on phone: Patient refused    Gets together: Patient refused    Attends religious service: Patient refused    Active member of club or organization: Patient refused    Attends meetings of clubs or organizations: Patient refused    Relationship status: Patient refused  . Intimate partner violence:    Fear of current or ex partner: Patient refused    Emotionally abused: Patient refused    Physically abused: Patient refused    Forced sexual activity: Patient refused  Other Topics Concern  . Not on file  Social History Narrative   Patient refused all questions- 06/03/2017    FAMILY HISTORY: Family History  Problem Relation Age of Onset  . Lymphoma Mother   . Hodgkin's lymphoma Mother   . Lung cancer Father   . Heart disease Brother   . Dementia Sister   . Dementia Brother   . Breast cancer Maternal Aunt     ALLERGIES:  is allergic to penicillins and sulfa antibiotics.  MEDICATIONS:  Current Outpatient Medications  Medication Sig Dispense Refill  . amLODipine (NORVASC) 5 MG tablet Take 1 tablet (5 mg total) by mouth daily. 90 tablet 3  . aspirin 81 MG tablet Take 1 tablet by mouth daily. Reported on 09/02/2015    . atorvastatin (LIPITOR) 40 MG tablet TAKE 1 TABLET BY MOUTH ONCE DAILY 90 tablet 3  . calcitRIOL  (ROCALTROL) 0.25 MCG capsule Take 0.25 mcg by mouth daily.    . Cyanocobalamin (VITAMIN B 12 PO) Take by mouth daily.    . finasteride (PROSCAR) 5 MG tablet TAKE 1 TABLET BY MOUTH ONCE DAILY 90 tablet 3  . fluticasone (FLONASE) 50 MCG/ACT nasal spray Place 2 sprays into both nostrils daily. 16 g 5  . hydrALAZINE (APRESOLINE) 50 MG tablet Take 50 mg by mouth 3 (three) times daily.    Marland Kitchen lisinopril (PRINIVIL,ZESTRIL) 40 MG tablet Take 1 tablet (40 mg total) by mouth daily. 90 tablet 3  . metoprolol tartrate (LOPRESSOR) 50 MG tablet Take 1 tablet (50 mg total) by mouth 2 (two) times daily. 180 tablet 3  . mycophenolate (CELLCEPT) 500 MG tablet Take 1,000 mg by mouth 2 (two) times daily.  3   No current facility-administered medications for this visit.      PHYSICAL EXAMINATION: ECOG PERFORMANCE STATUS: 1 - Symptomatic but completely ambulatory Vitals:   01/23/18 1102  BP: Marland Kitchen)  137/58  Pulse: (!) 57  Resp: 16  Temp: (!) 96.2 F (35.7 C)   Filed Weights   01/23/18 1102  Weight: 184 lb 1.4 oz (83.5 kg)    Physical Exam  Constitutional: He is oriented to person, place, and time. He appears well-developed and well-nourished. No distress.  HENT:  Head: Normocephalic and atraumatic.  Mouth/Throat: Oropharynx is clear and moist.  Eyes: Pupils are equal, round, and reactive to light. Conjunctivae and EOM are normal. No scleral icterus.  Pale conjuctivae  Neck: Normal range of motion. Neck supple.  Cardiovascular: Normal rate, regular rhythm and normal heart sounds.  Pulmonary/Chest: Effort normal and breath sounds normal. No respiratory distress. He has no wheezes. He has no rales. He exhibits no tenderness.  Abdominal: Soft. Bowel sounds are normal. He exhibits no distension and no mass. There is no tenderness.  Musculoskeletal: Normal range of motion. He exhibits no edema or deformity.  Bilateral trace edema.   Lymphadenopathy:    He has no cervical adenopathy.  Neurological: He is  alert and oriented to person, place, and time. No cranial nerve deficit. Coordination normal.  Skin: Skin is warm and dry. No rash noted. No erythema.  Psychiatric: He has a normal mood and affect. His behavior is normal. Thought content normal.     LABORATORY DATA:  I have reviewed the data as listed Lab Results  Component Value Date   WBC 8.6 01/21/2018   HGB 9.1 (L) 01/21/2018   HCT 28.3 (L) 01/21/2018   MCV 95.1 01/21/2018   PLT 311 01/21/2018   Recent Labs    06/03/17 0909 07/29/17 0855 08/01/17 0448  NA 144 138 143  K 5.1 4.4 4.5  CL 108* 108 117*  CO2 20 22 25   GLUCOSE 79 103* 112*  BUN 25 34* 36*  CREATININE 1.76* 1.96* 1.92*  CALCIUM 8.6 8.7* 8.3*  GFRNONAA 36* 31* 32*  GFRAA 42* 36* 37*  PROT 5.6* 6.2*  --   ALBUMIN 3.5 2.9*  --   AST 16 19  --   ALT 14 16*  --   ALKPHOS 151* 124  --   BILITOT 0.3 0.6  --        ASSESSMENT & PLAN:  1. Anemia in chronic kidney disease, unspecified CKD stage    #Anemia: Multifactorial secondary to CellCept and anemia of chronic kidney disease. He responds well to Procrit 50,000 unit that was given 4 weeks ago. Labs reviewed and discussed with patient.  We will proceed with Procrit 50,000 units today.  #Patient never had colonoscopy and is not interested for obtaining one.  Previously ordered stool occult was not not done All questions were answered. The patient knows to call the clinic with any problems questions or concerns. Total face to face encounter time for this patient visit was 50min. >50% of the time was  spent in counseling and coordination of care.  Return of visit: 4 weeks  Earlie Server, MD, Ph D Hematology Oncology [facility] Pager- [phone] 01/23/2018

## 2018-01-23 NOTE — Progress Notes (Signed)
Patient here today for follow up and procrit injection.  Patient states no new concerns today

## 2018-01-25 ENCOUNTER — Other Ambulatory Visit: Payer: Self-pay | Admitting: Internal Medicine

## 2018-02-20 ENCOUNTER — Inpatient Hospital Stay: Payer: Medicare Other | Attending: Oncology | Admitting: Oncology

## 2018-02-20 ENCOUNTER — Inpatient Hospital Stay: Payer: Medicare Other

## 2018-02-20 ENCOUNTER — Encounter: Payer: Self-pay | Admitting: Oncology

## 2018-02-20 ENCOUNTER — Other Ambulatory Visit: Payer: Self-pay

## 2018-02-20 VITALS — BP 130/75 | HR 47 | Temp 96.9°F | Resp 18 | Wt 185.0 lb

## 2018-02-20 DIAGNOSIS — N184 Chronic kidney disease, stage 4 (severe): Secondary | ICD-10-CM

## 2018-02-20 DIAGNOSIS — R5383 Other fatigue: Secondary | ICD-10-CM

## 2018-02-20 DIAGNOSIS — N189 Chronic kidney disease, unspecified: Principal | ICD-10-CM

## 2018-02-20 DIAGNOSIS — D631 Anemia in chronic kidney disease: Secondary | ICD-10-CM | POA: Diagnosis not present

## 2018-02-20 DIAGNOSIS — N051 Unspecified nephritic syndrome with focal and segmental glomerular lesions: Secondary | ICD-10-CM

## 2018-02-20 DIAGNOSIS — N289 Disorder of kidney and ureter, unspecified: Secondary | ICD-10-CM

## 2018-02-20 DIAGNOSIS — Z87891 Personal history of nicotine dependence: Secondary | ICD-10-CM

## 2018-02-20 DIAGNOSIS — I129 Hypertensive chronic kidney disease with stage 1 through stage 4 chronic kidney disease, or unspecified chronic kidney disease: Secondary | ICD-10-CM

## 2018-02-20 DIAGNOSIS — Z803 Family history of malignant neoplasm of breast: Secondary | ICD-10-CM

## 2018-02-20 DIAGNOSIS — N2889 Other specified disorders of kidney and ureter: Secondary | ICD-10-CM

## 2018-02-20 LAB — CBC WITH DIFFERENTIAL/PLATELET
BASOS ABS: 0 10*3/uL (ref 0–0.1)
Basophils Relative: 1 %
EOS ABS: 0.1 10*3/uL (ref 0–0.7)
EOS PCT: 1 %
HCT: 28.9 % — ABNORMAL LOW (ref 40.0–52.0)
Hemoglobin: 9.3 g/dL — ABNORMAL LOW (ref 13.0–18.0)
Lymphocytes Relative: 23 %
Lymphs Abs: 2 10*3/uL (ref 1.0–3.6)
MCH: 30.5 pg (ref 26.0–34.0)
MCHC: 32.1 g/dL (ref 32.0–36.0)
MCV: 95.3 fL (ref 80.0–100.0)
Monocytes Absolute: 0.4 10*3/uL (ref 0.2–1.0)
Monocytes Relative: 5 %
Neutro Abs: 6.2 10*3/uL (ref 1.4–6.5)
Neutrophils Relative %: 70 %
PLATELETS: 316 10*3/uL (ref 150–440)
RBC: 3.03 MIL/uL — AB (ref 4.40–5.90)
RDW: 15.8 % — ABNORMAL HIGH (ref 11.5–14.5)
WBC: 8.7 10*3/uL (ref 3.8–10.6)

## 2018-02-20 MED ORDER — EPOETIN ALFA 40000 UNIT/ML IJ SOLN
40000.0000 [IU] | Freq: Once | INTRAMUSCULAR | Status: AC
  Administered 2018-02-20: 40000 [IU] via SUBCUTANEOUS

## 2018-02-20 MED ORDER — EPOETIN ALFA 10000 UNIT/ML IJ SOLN
10000.0000 [IU] | Freq: Once | INTRAMUSCULAR | Status: AC
  Administered 2018-02-20: 10000 [IU] via SUBCUTANEOUS

## 2018-02-20 MED ORDER — EPOETIN ALFA 20000 UNIT/ML IJ SOLN: 50000.0000 [IU] | Freq: Once | INTRAMUSCULAR | Status: DC

## 2018-02-20 NOTE — Patient Instructions (Signed)
Epoetin Alfa injection °What is this medicine? °EPOETIN ALFA (e POE e tin AL fa) helps your body make more red blood cells. This medicine is used to treat anemia caused by chronic kidney failure, cancer chemotherapy, or HIV-therapy. It may also be used before surgery if you have anemia. °This medicine may be used for other purposes; ask your health care provider or pharmacist if you have questions. °COMMON BRAND NAME(S): Epogen, Procrit °What should I tell my health care provider before I take this medicine? °They need to know if you have any of these conditions: °-blood clotting disorders °-cancer patient not on chemotherapy °-cystic fibrosis °-heart disease, such as angina or heart failure °-hemoglobin level of 12 g/dL or greater °-high blood pressure °-low levels of folate, iron, or vitamin B12 °-seizures °-an unusual or allergic reaction to erythropoietin, albumin, benzyl alcohol, hamster proteins, other medicines, foods, dyes, or preservatives °-pregnant or trying to get pregnant °-breast-feeding °How should I use this medicine? °This medicine is for injection into a vein or under the skin. It is usually given by a health care professional in a hospital or clinic setting. °If you get this medicine at home, you will be taught how to prepare and give this medicine. Use exactly as directed. Take your medicine at regular intervals. Do not take your medicine more often than directed. °It is important that you put your used needles and syringes in a special sharps container. Do not put them in a trash can. If you do not have a sharps container, call your pharmacist or healthcare provider to get one. °A special MedGuide will be given to you by the pharmacist with each prescription and refill. Be sure to read this information carefully each time. °Talk to your pediatrician regarding the use of this medicine in children. While this drug may be prescribed for selected conditions, precautions do apply. °Overdosage: If you  think you have taken too much of this medicine contact a poison control center or emergency room at once. °NOTE: This medicine is only for you. Do not share this medicine with others. °What if I miss a dose? °If you miss a dose, take it as soon as you can. If it is almost time for your next dose, take only that dose. Do not take double or extra doses. °What may interact with this medicine? °Do not take this medicine with any of the following medications: °-darbepoetin alfa °This list may not describe all possible interactions. Give your health care provider a list of all the medicines, herbs, non-prescription drugs, or dietary supplements you use. Also tell them if you smoke, drink alcohol, or use illegal drugs. Some items may interact with your medicine. °What should I watch for while using this medicine? °Your condition will be monitored carefully while you are receiving this medicine. °You may need blood work done while you are taking this medicine. °What side effects may I notice from receiving this medicine? °Side effects that you should report to your doctor or health care professional as soon as possible: °-allergic reactions like skin rash, itching or hives, swelling of the face, lips, or tongue °-breathing problems °-changes in vision °-chest pain °-confusion, trouble speaking or understanding °-feeling faint or lightheaded, falls °-high blood pressure °-muscle aches or pains °-pain, swelling, warmth in the leg °-rapid weight gain °-severe headaches °-sudden numbness or weakness of the face, arm or leg °-trouble walking, dizziness, loss of balance or coordination °-seizures (convulsions) °-swelling of the ankles, feet, hands °-unusually weak or tired °  Side effects that usually do not require medical attention (report to your doctor or health care professional if they continue or are bothersome): °-diarrhea °-fever, chills (flu-like symptoms) °-headaches °-nausea, vomiting °-redness, stinging, or swelling at  site where injected °This list may not describe all possible side effects. Call your doctor for medical advice about side effects. You may report side effects to FDA at 1-800-FDA-1088. °Where should I keep my medicine? °Keep out of the reach of children. °Store in a refrigerator between 2 and 8 degrees C (36 and 46 degrees F). Do not freeze or shake. Throw away any unused portion if using a single-dose vial. Multi-dose vials can be kept in the refrigerator for up to 21 days after the initial dose. Throw away unused medicine. °NOTE: This sheet is a summary. It may not cover all possible information. If you have questions about this medicine, talk to your doctor, pharmacist, or health care provider. °© 2018 [employer] (2016-01-23 19:42:31) ° °

## 2018-02-20 NOTE — Progress Notes (Signed)
Patient here for follow up. No concerns voiced.  °

## 2018-02-20 NOTE — Progress Notes (Signed)
Hematology/Oncology Follow up note [facility] [phone] [phone]   Patient Care Team: Juline Patch, MD as PCP - General (Family Medicine) Hollice Espy, MD as Consulting Physician (Urology) Anthonette Legato, MD (Nephrology)  REFERRING PROVIDER: Dr.Lateef REASON FOR VISIT Follow up for treatment of anemia of CKD  HISTORY OF PRESENTING ILLNESS:  Arthur Bowen is a  77 y.o.  male with PMH listed below who was referred to me for evaluation of anemia of CKD.  Patient also has left renal mass which is being followed by urology.  He had recent labs done at the nephrologist office on October 15, 2017 which showed WBC 9.2, hemoglobin 9.1, MCV 89, platelet counts 401 thousand, normal differential.  Creatinine 2.11,  Reports feeling tired.Denies hematochezia, hematuria, hematemesis, epistaxis, black tarry stool or easy bruising.  Last colonoscopy: never had colonoscopy.  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 Renal mass, follows up with Dr.Brandon. Previous negative biopsy [place] of kidney on 11/18/2017  was Independently reviewed by me and discussed with patient. US showed stable left kidney mass, 5.1cm.  I communicated with Dr.Brandon who cleared patient to start procrit therapy.  INTERVAL HISTORY Arthur Bowen is a 77 y.o. male who has above history reviewed by me presents for follow-up visit for the management of anemia secondary to chronic kidney disease. Patient has been on monthly Procrit 50,000 units.  Reports tolerates well with no side effects.  Denies any leg swelling or pain, chest pain, focal weakness.  Fatigue, feeling stable level fatigue.  Not better or worse. FSGS, he follows with nephrologist.  On CellCept.   Review of Systems  Constitutional: Positive for malaise/fatigue. Negative for chills, fever and weight loss.  HENT: Negative for congestion, ear discharge, ear pain, nosebleeds, sinus pain and sore throat.   Eyes: Negative for double  vision, photophobia, pain, discharge and redness.  Respiratory: Negative for cough, hemoptysis, sputum production, shortness of breath and wheezing.   Cardiovascular: Negative for chest pain, palpitations, orthopnea, claudication and leg swelling.  Gastrointestinal: Negative for abdominal pain, blood in stool, constipation, diarrhea, heartburn, melena, nausea and vomiting.  Genitourinary: Negative for dysuria, flank pain, frequency and hematuria.  Musculoskeletal: Negative for back pain, myalgias and neck pain.  Skin: Negative for itching and rash.  Neurological: Negative for dizziness, tingling, tremors, focal weakness, weakness and headaches.  Endo/Heme/Allergies: Negative for environmental allergies. Does not bruise/bleed easily.  Psychiatric/Behavioral: Negative for depression and hallucinations. The patient is not nervous/anxious.     MEDICAL HISTORY:  Past Medical History:  Diagnosis Date  . Acquired phimosis   . BPH (benign prostatic hypertrophy)   . Chronic kidney disease   . Dyspnea    with exertion  . Elevated PSA   . Gross hematuria   . Hematuria   . History of hiatal hernia   . Hypertension   . 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 Renal mass, left     SURGICAL HISTORY: Past Surgical History:  Procedure Laterality Date  . Circumscision  09/2015  . CORONARY ARTERY BYPASS GRAFT  2002   x3 @ [facility]  . INGUINAL HERNIA REPAIR Left 2011  . RENAL BIOPSY  2014   neg in w/u renal mass/hematuria    SOCIAL HISTORY: Social History   Socioeconomic History  . Marital status: Divorced    Spouse name: Not on file  . Number of children: 2  . Years of education: Not on file  . Highest education level: Not on  file  Occupational History  . Not on file  Social Needs  . Financial resource strain: Patient refused  . Food insecurity:    Worry: Patient refused    Inability: Patient refused  . Transportation needs:    Medical: Patient refused    Non-medical:  Patient refused  Tobacco Use  . Smoking status: Former Smoker    Packs/day: 0.50    Years: 50.00    Pack years: 25.00    Types: Cigarettes    Last attempt to quit: 10/10/2017    Years since quitting: 0.3  . Smokeless tobacco: Former Systems developer    Types: Chew  . Tobacco comment: last cig 10/10/17   Substance and Sexual Activity  . Alcohol use: No    Alcohol/week: 0.0 standard drinks  . Drug use: No  . Sexual activity: Not on file  Lifestyle  . Physical activity:    Days per week: Patient refused    Minutes per session: Patient refused  . Stress: Patient refused  Relationships  . Social connections:    Talks on phone: Patient refused    Gets together: Patient refused    Attends religious service: Patient refused    Active member of club or organization: Patient refused    Attends meetings of clubs or organizations: Patient refused    Relationship status: Patient refused  . Intimate partner violence:    Fear of current or ex partner: Patient refused    Emotionally abused: Patient refused    Physically abused: Patient refused    Forced sexual activity: Patient refused  Other Topics Concern  . Not on file  Social History Narrative   Patient refused all questions- 06/03/2017    FAMILY HISTORY: Family History  Problem Relation Age of Onset  . Lymphoma Mother   . Hodgkin's lymphoma Mother   . Lung cancer Father   . Heart disease Brother   . Dementia Sister   . Dementia Brother   . Breast cancer Maternal Aunt     ALLERGIES:  is allergic to penicillins and sulfa antibiotics.  MEDICATIONS:  Current Outpatient Medications  Medication Sig Dispense Refill  . amLODipine (NORVASC) 5 MG tablet Take 1 tablet (5 mg total) by mouth daily. 90 tablet 3  . aspirin 81 MG tablet Take 1 tablet by mouth daily. Reported on 09/02/2015    . atorvastatin (LIPITOR) 40 MG tablet TAKE 1 TABLET BY MOUTH ONCE DAILY 90 tablet 3  . calcitRIOL (ROCALTROL) 0.25 MCG capsule Take 0.25 mcg by mouth daily.     . Cyanocobalamin (VITAMIN B 12 PO) Take by mouth daily.    . finasteride (PROSCAR) 5 MG tablet TAKE 1 TABLET BY MOUTH ONCE DAILY 90 tablet 3  . fluticasone (FLONASE) 50 MCG/ACT nasal spray Place 2 sprays into both nostrils daily. 16 g 5  . hydrALAZINE (APRESOLINE) 50 MG tablet Take 50 mg by mouth 3 (three) times daily.    Marland Kitchen lisinopril (PRINIVIL,ZESTRIL) 40 MG tablet TAKE 1 TABLET BY MOUTH ONCE DAILY 90 tablet 3  . metoprolol tartrate (LOPRESSOR) 50 MG tablet Take 1 tablet (50 mg total) by mouth 2 (two) times daily. 180 tablet 3  . mycophenolate (CELLCEPT) 500 MG tablet Take 1,000 mg by mouth 2 (two) times daily.  3  . predniSONE (DELTASONE) 10 MG tablet Take 10 mg by mouth daily.  3   No current facility-administered medications for this visit.      PHYSICAL EXAMINATION: ECOG PERFORMANCE STATUS: 1 - Symptomatic but completely ambulatory Vitals:  02/20/18 0848  BP: 130/75  Pulse: (!) 47  Resp: 18  Temp: (!) 96.9 F (36.1 C)   Filed Weights   02/20/18 0848  Weight: 184 lb 15.5 oz (83.9 kg)    Physical Exam  Constitutional: He is oriented to person, place, and time. He appears well-developed and well-nourished. No distress.  HENT:  Head: Normocephalic and atraumatic.  Right Ear: External ear normal.  Left Ear: External ear normal.  Mouth/Throat: Oropharynx is clear and moist.  Eyes: Pupils are equal, round, and reactive to light. Conjunctivae and EOM are normal. No scleral icterus.  Pale conjuctivae  Neck: Normal range of motion. Neck supple.  Cardiovascular: Normal rate, regular rhythm and normal heart sounds.  Pulmonary/Chest: Effort normal and breath sounds normal. No respiratory distress. He has no wheezes. He has no rales. He exhibits no tenderness.  Abdominal: Soft. Bowel sounds are normal. He exhibits no distension and no mass. There is no tenderness.  Musculoskeletal: Normal range of motion. He exhibits no edema or deformity.  Bilateral trace edema.     Lymphadenopathy:    He has no cervical adenopathy.  Neurological: He is alert and oriented to person, place, and time. No cranial nerve deficit. Coordination normal.  Skin: Skin is warm and dry. No rash noted. No erythema.  Psychiatric: He has a normal mood and affect. His behavior is normal. Thought content normal.     LABORATORY DATA:  I have reviewed the data as listed Lab Results  Component Value Date   WBC 8.7 02/20/2018   HGB 9.3 (L) 02/20/2018   HCT 28.9 (L) 02/20/2018   MCV 95.3 02/20/2018   PLT 316 02/20/2018   Recent Labs    06/03/17 0909 07/29/17 0855 08/01/17 0448  NA 144 138 143  K 5.1 4.4 4.5  CL 108* 108 117*  CO2 20 22 25   GLUCOSE 79 103* 112*  BUN 25 34* 36*  CREATININE 1.76* 1.96* 1.92*  CALCIUM 8.6 8.7* 8.3*  GFRNONAA 36* 31* 32*  GFRAA 42* 36* 37*  PROT 5.6* 6.2*  --   ALBUMIN 3.5 2.9*  --   AST 16 19  --   ALT 14 16*  --   ALKPHOS 151* 124  --   BILITOT 0.3 0.6  --        ASSESSMENT & PLAN:  1. Anemia in stage 4 chronic kidney disease ([place])   2. FSGS (focal segmental glomerulosclerosis)   3. Renal mass    #Anemia: Multifactorial secondary to CellCept and anemia of chronic kidney disease.  He has responded nicely to Procrit 50,000 units monthly.  Last injection 4 weeks ago Labs reviewed today's hemoglobin at 9.3.  Discussed with patient. Recommend proceed with today's Procrit 50,000 unit treatment. Patient feels that co-pay is high and ask if he can come every 2 months instead of every months for Procrit injection. Discussed with patient that ideally monthly Procrit 50,000 units is preferred.  However I think his hemoglobin has been fairly stable recently.  We can try to space out to every 8 weeks interval.  Patient appreciated adjustment.  And he understands that this may lead to some optimized control of his anemia.   #Patient never had colonoscopy and is not interested for obtaining one.  Previously ordered stool occult was not not  done  #Renal mass, recently seen by urology.  Likely benign with stable size. Orders Placed This Encounter  Procedures  . CBC with Differential/Platelet    Standing Status:   Future  Standing Expiration Date:   02/21/2019    Return of visit: 8 weeks  Earlie Server, MD, Ph D Hematology Oncology [place] [facility] Pager- [phone] 02/20/2018

## 2018-03-13 DIAGNOSIS — N041 Nephrotic syndrome with focal and segmental glomerular lesions: Secondary | ICD-10-CM | POA: Diagnosis not present

## 2018-03-13 DIAGNOSIS — I10 Essential (primary) hypertension: Secondary | ICD-10-CM | POA: Diagnosis not present

## 2018-03-13 DIAGNOSIS — N2581 Secondary hyperparathyroidism of renal origin: Secondary | ICD-10-CM | POA: Diagnosis not present

## 2018-03-13 DIAGNOSIS — N183 Chronic kidney disease, stage 3 (moderate): Secondary | ICD-10-CM | POA: Diagnosis not present

## 2018-03-13 DIAGNOSIS — R809 Proteinuria, unspecified: Secondary | ICD-10-CM | POA: Diagnosis not present

## 2018-03-13 DIAGNOSIS — D631 Anemia in chronic kidney disease: Secondary | ICD-10-CM | POA: Diagnosis not present

## 2018-04-07 ENCOUNTER — Ambulatory Visit (INDEPENDENT_AMBULATORY_CARE_PROVIDER_SITE_OTHER): Payer: Medicare Other | Admitting: Internal Medicine

## 2018-04-07 ENCOUNTER — Encounter: Payer: Self-pay | Admitting: Internal Medicine

## 2018-04-07 VITALS — BP 104/60 | HR 62 | Temp 97.7°F | Ht 72.0 in | Wt 189.0 lb

## 2018-04-07 DIAGNOSIS — I25119 Atherosclerotic heart disease of native coronary artery with unspecified angina pectoris: Secondary | ICD-10-CM | POA: Diagnosis not present

## 2018-04-07 DIAGNOSIS — N2581 Secondary hyperparathyroidism of renal origin: Secondary | ICD-10-CM

## 2018-04-07 DIAGNOSIS — J181 Lobar pneumonia, unspecified organism: Secondary | ICD-10-CM

## 2018-04-07 DIAGNOSIS — D692 Other nonthrombocytopenic purpura: Secondary | ICD-10-CM

## 2018-04-07 DIAGNOSIS — J189 Pneumonia, unspecified organism: Secondary | ICD-10-CM

## 2018-04-07 DIAGNOSIS — D631 Anemia in chronic kidney disease: Secondary | ICD-10-CM

## 2018-04-07 DIAGNOSIS — N184 Chronic kidney disease, stage 4 (severe): Secondary | ICD-10-CM

## 2018-04-07 MED ORDER — DOXYCYCLINE HYCLATE 100 MG PO TABS: 100.0000 mg | ORAL_TABLET | Freq: Two times a day (BID) | ORAL | 0 refills | Status: AC

## 2018-04-07 MED ORDER — NITROGLYCERIN 0.4 MG SL SUBL: 0.4000 mg | SUBLINGUAL_TABLET | SUBLINGUAL | 3 refills | Status: DC | PRN

## 2018-04-07 NOTE — Progress Notes (Signed)
Date:  04/07/2018   Name:  Arthur Bowen   DOB:  [birth year]   MRN:  [id number]   Chief Complaint: Nasal Congestion (Started a week ago. Cough with slightly yellow mucous. Mostly clear. Staretd with sore throat but that is now gone. )  Hypertension  This is a chronic problem. The problem is controlled. Associated symptoms include shortness of breath. Pertinent negatives include no chest pain, headaches or palpitations. Past treatments include beta blockers, calcium channel blockers, direct vasodilators and ACE inhibitors. The current treatment provides significant improvement.  Cough  This is a new problem. The current episode started in the past 7 days. The problem has been gradually worsening. The problem occurs every few minutes. The cough is productive of sputum. Associated symptoms include postnasal drip, rhinorrhea, shortness of breath and wheezing. Pertinent negatives include no chest pain, chills, fever or headaches. He has tried OTC cough suppressant for the symptoms. The treatment provided mild relief. hx of smoking  CAD - he does not see a cardiologist.  He denies angina but does need refill on NTG. His blood pressure is controlled.  He does not have an active lifestyle at baseline. CKD - now on Cellcept and has lost some weight.  He is also getting Epogen infusions for his anemia secondary to CKD.  Overall he feels fairly well until the recent infection.  Review of Systems  Constitutional: Negative for chills and fever.  HENT: Positive for postnasal drip and rhinorrhea. Negative for trouble swallowing.   Respiratory: Positive for cough, chest tightness, shortness of breath and wheezing.   Cardiovascular: Negative for chest pain, palpitations and leg swelling.  Gastrointestinal: Negative for abdominal pain, constipation and diarrhea.  Skin: Negative for color change.  Neurological: Negative for dizziness and headaches.  Hematological: Negative for adenopathy. Bruises/bleeds easily.     Patient Active Problem List   Diagnosis Date Noted  . Anemia in stage 4 chronic kidney disease ([place]) 11/21/2017  . Secondary hyperparathyroidism of renal origin ([place]) 10/15/2017  . CKD stage 4 secondary to hypertension ([place]) 10/09/2017  . Primary osteoarthritis of right knee 01/09/2017  . Elevated PSA 08/14/2015  . Renal mass 08/14/2015  . BPH with obstruction/lower urinary tract symptoms 08/14/2015  . History of hematuria 08/14/2015  . Coronary artery disease involving native coronary artery of native heart with angina pectoris ([place]) 04/06/2015  . Dyslipidemia 04/06/2015  . Essential (primary) hypertension 04/06/2015  . Neuropathy 04/06/2015  . Peripheral vascular disease ([place]) 04/06/2015  . 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 Senile purpura ([place]) 04/06/2015    Allergies  Allergen Reactions  . Penicillins Other (See Comments)  . Sulfa Antibiotics Other (See Comments)    Past Surgical History:  Procedure Laterality Date  . Circumscision  09/2015  . CORONARY ARTERY BYPASS GRAFT  2002   x3 @ [facility]  . INGUINAL HERNIA REPAIR Left 2011  . RENAL BIOPSY  2014   neg in w/u renal mass/hematuria    Social History   Tobacco Use  . Smoking status: Former Smoker    Packs/day: 0.50    Years: 50.00    Pack years: 25.00    Types: Cigarettes    Last attempt to quit: 10/10/2017    Years since quitting: 0.4  . Smokeless tobacco: Former Systems developer    Types: Chew  . Tobacco comment: last cig 10/10/17   Substance Use Topics  . Alcohol use: No    Alcohol/week: 0.0 standard drinks  . Drug use: No  Medication list has been reviewed and updated.  Current Meds  Medication Sig  . amLODipine (NORVASC) 5 MG tablet Take 1 tablet (5 mg total) by mouth daily.  Marland Kitchen aspirin 81 MG tablet Take 1 tablet by mouth daily. Reported on 09/02/2015  . atorvastatin (LIPITOR) 40 MG tablet TAKE 1 TABLET BY MOUTH ONCE DAILY  . calcitRIOL (ROCALTROL) 0.25 MCG capsule Take 0.25 mcg by mouth  daily.  . Cyanocobalamin (VITAMIN B 12 PO) Take by mouth daily.  . finasteride (PROSCAR) 5 MG tablet TAKE 1 TABLET BY MOUTH ONCE DAILY  . fluticasone (FLONASE) 50 MCG/ACT nasal spray Place 2 sprays into both nostrils daily.  . hydrALAZINE (APRESOLINE) 50 MG tablet Take 50 mg by mouth 3 (three) times daily.  Marland Kitchen lisinopril (PRINIVIL,ZESTRIL) 40 MG tablet TAKE 1 TABLET BY MOUTH ONCE DAILY  . metoprolol tartrate (LOPRESSOR) 50 MG tablet Take 1 tablet (50 mg total) by mouth 2 (two) times daily.  . mycophenolate (CELLCEPT) 500 MG tablet Take 1,000 mg by mouth 2 (two) times daily.    PHQ 2/9 Scores 12/02/2017 05/30/2016 05/26/2015  PHQ - 2 Score 0 0 0    Physical Exam  Constitutional: He is oriented to person, place, and time. He appears well-developed. No distress.  HENT:  Head: Normocephalic and atraumatic.  Cardiovascular: Normal rate, regular rhythm and normal heart sounds.  Pulmonary/Chest: Effort normal. No accessory muscle usage. No respiratory distress. He has wheezes in the right upper field and the left upper field. He has no rales.  Musculoskeletal: Normal range of motion.  Neurological: He is alert and oriented to person, place, and time.  Skin: Skin is warm and dry. No rash noted.  Psychiatric: He has a normal mood and affect. His speech is normal and behavior is normal. Thought content normal.  Nursing note and vitals reviewed.   BP 104/60 (BP Location: Right Arm, Patient Position: Sitting, Cuff Size: Normal)   Pulse 62   Temp 97.7 F (36.5 C) (Oral)   Ht 6' (1.829 m)   Wt 189 lb (85.7 kg)   SpO2 100%   BMI 25.63 kg/m   Assessment and Plan: 1. Community acquired pneumonia of right upper lobe of lung ([place]) Continue Mucinex daily Increase fluids flonase for rhinorrhea - doxycycline (VIBRA-TABS) 100 MG tablet; Take 1 tablet (100 mg total) by mouth 2 (two) times daily for 10 days.  Dispense: 20 tablet; Refill: 0  2. Senile purpura (HCC) stable  3. Secondary  hyperparathyroidism of renal origin [facility]) Followed by Nephrology On calcitriol and Cellcept  4. Coronary artery disease involving native coronary artery of native heart with angina pectoris (HCC) stable - nitroGLYCERIN (NITROSTAT) 0.4 MG SL tablet; Place 1 tablet (0.4 mg total) under the tongue every 5 (five) minutes as needed for chest pain.  Dispense: 50 tablet; Refill: 3  5. Anemia in stage 4 chronic kidney disease [facility]) Being followed by Hematology On Epogen infusions every 4-8 weeks   Partially dictated using Editor, commissioning. Any errors are unintentional.  Halina Maidens, MD [facility]  04/07/2018

## 2018-04-07 NOTE — Patient Instructions (Addendum)
Continue Mucinex daily - drink plenty of fluids  Continue flonase nasal spray

## 2018-04-17 ENCOUNTER — Ambulatory Visit: Payer: Medicare Other | Admitting: Oncology

## 2018-04-17 ENCOUNTER — Ambulatory Visit: Payer: Medicare Other

## 2018-04-17 ENCOUNTER — Other Ambulatory Visit: Payer: Medicare Other

## 2018-04-23 DIAGNOSIS — Z23 Encounter for immunization: Secondary | ICD-10-CM | POA: Diagnosis not present

## 2018-06-05 ENCOUNTER — Encounter: Payer: Self-pay | Admitting: Emergency Medicine

## 2018-06-05 ENCOUNTER — Inpatient Hospital Stay
Admission: EM | Admit: 2018-06-05 | Discharge: 2018-06-07 | DRG: 281 | Disposition: A | Discharge status: Home / Self Care | Payer: Medicare Other | Attending: Family Medicine | Admitting: Family Medicine

## 2018-06-05 ENCOUNTER — Emergency Department: Payer: Medicare Other

## 2018-06-05 ENCOUNTER — Other Ambulatory Visit: Payer: Self-pay

## 2018-06-05 ENCOUNTER — Encounter: Payer: Self-pay | Admitting: Internal Medicine

## 2018-06-05 ENCOUNTER — Ambulatory Visit (INDEPENDENT_AMBULATORY_CARE_PROVIDER_SITE_OTHER): Payer: Medicare Other | Admitting: Internal Medicine

## 2018-06-05 VITALS — BP 102/56 | HR 50 | Ht 72.0 in | Wt 189.0 lb

## 2018-06-05 DIAGNOSIS — I959 Hypotension, unspecified: Secondary | ICD-10-CM | POA: Diagnosis present

## 2018-06-05 DIAGNOSIS — E785 Hyperlipidemia, unspecified: Secondary | ICD-10-CM

## 2018-06-05 DIAGNOSIS — W19XXXA Unspecified fall, initial encounter: Secondary | ICD-10-CM | POA: Diagnosis not present

## 2018-06-05 DIAGNOSIS — I252 Old myocardial infarction: Secondary | ICD-10-CM | POA: Diagnosis not present

## 2018-06-05 DIAGNOSIS — F17211 Nicotine dependence, cigarettes, in remission: Secondary | ICD-10-CM | POA: Diagnosis not present

## 2018-06-05 DIAGNOSIS — I10 Essential (primary) hypertension: Secondary | ICD-10-CM

## 2018-06-05 DIAGNOSIS — R972 Elevated prostate specific antigen [PSA]: Secondary | ICD-10-CM

## 2018-06-05 DIAGNOSIS — Z7952 Long term (current) use of systemic steroids: Secondary | ICD-10-CM | POA: Diagnosis not present

## 2018-06-05 DIAGNOSIS — N183 Chronic kidney disease, stage 3 (moderate): Secondary | ICD-10-CM | POA: Diagnosis present

## 2018-06-05 DIAGNOSIS — R001 Bradycardia, unspecified: Principal | ICD-10-CM

## 2018-06-05 DIAGNOSIS — I25119 Atherosclerotic heart disease of native coronary artery with unspecified angina pectoris: Secondary | ICD-10-CM

## 2018-06-05 DIAGNOSIS — D649 Anemia, unspecified: Secondary | ICD-10-CM

## 2018-06-05 DIAGNOSIS — N041 Nephrotic syndrome with focal and segmental glomerular lesions: Secondary | ICD-10-CM | POA: Diagnosis not present

## 2018-06-05 DIAGNOSIS — Z7982 Long term (current) use of aspirin: Secondary | ICD-10-CM | POA: Diagnosis not present

## 2018-06-05 DIAGNOSIS — Z0000 Encounter for general adult medical examination without abnormal findings: Secondary | ICD-10-CM | POA: Diagnosis not present

## 2018-06-05 DIAGNOSIS — Z7951 Long term (current) use of inhaled steroids: Secondary | ICD-10-CM | POA: Diagnosis not present

## 2018-06-05 DIAGNOSIS — I739 Peripheral vascular disease, unspecified: Secondary | ICD-10-CM | POA: Diagnosis present

## 2018-06-05 DIAGNOSIS — R531 Weakness: Secondary | ICD-10-CM | POA: Diagnosis not present

## 2018-06-05 DIAGNOSIS — R809 Proteinuria, unspecified: Secondary | ICD-10-CM | POA: Diagnosis not present

## 2018-06-05 DIAGNOSIS — Z79899 Other long term (current) drug therapy: Secondary | ICD-10-CM | POA: Diagnosis not present

## 2018-06-05 DIAGNOSIS — D631 Anemia in chronic kidney disease: Secondary | ICD-10-CM | POA: Diagnosis not present

## 2018-06-05 DIAGNOSIS — N2581 Secondary hyperparathyroidism of renal origin: Secondary | ICD-10-CM | POA: Diagnosis present

## 2018-06-05 DIAGNOSIS — E538 Deficiency of other specified B group vitamins: Secondary | ICD-10-CM

## 2018-06-05 DIAGNOSIS — E782 Mixed hyperlipidemia: Secondary | ICD-10-CM | POA: Diagnosis present

## 2018-06-05 DIAGNOSIS — Z951 Presence of aortocoronary bypass graft: Secondary | ICD-10-CM

## 2018-06-05 DIAGNOSIS — Z87891 Personal history of nicotine dependence: Secondary | ICD-10-CM | POA: Diagnosis not present

## 2018-06-05 DIAGNOSIS — Z8249 Family history of ischemic heart disease and other diseases of the circulatory system: Secondary | ICD-10-CM | POA: Diagnosis not present

## 2018-06-05 DIAGNOSIS — N400 Enlarged prostate without lower urinary tract symptoms: Secondary | ICD-10-CM | POA: Diagnosis present

## 2018-06-05 DIAGNOSIS — I2510 Atherosclerotic heart disease of native coronary artery without angina pectoris: Secondary | ICD-10-CM | POA: Diagnosis present

## 2018-06-05 DIAGNOSIS — T451X5A Adverse effect of antineoplastic and immunosuppressive drugs, initial encounter: Secondary | ICD-10-CM | POA: Diagnosis present

## 2018-06-05 DIAGNOSIS — T447X5A Adverse effect of beta-adrenoreceptor antagonists, initial encounter: Secondary | ICD-10-CM | POA: Diagnosis present

## 2018-06-05 DIAGNOSIS — N179 Acute kidney failure, unspecified: Secondary | ICD-10-CM | POA: Diagnosis present

## 2018-06-05 DIAGNOSIS — R5383 Other fatigue: Secondary | ICD-10-CM | POA: Diagnosis not present

## 2018-06-05 DIAGNOSIS — K922 Gastrointestinal hemorrhage, unspecified: Secondary | ICD-10-CM | POA: Diagnosis not present

## 2018-06-05 DIAGNOSIS — I21A1 Myocardial infarction type 2: Secondary | ICD-10-CM | POA: Diagnosis present

## 2018-06-05 DIAGNOSIS — K521 Toxic gastroenteritis and colitis: Secondary | ICD-10-CM | POA: Diagnosis present

## 2018-06-05 DIAGNOSIS — N189 Chronic kidney disease, unspecified: Secondary | ICD-10-CM

## 2018-06-05 DIAGNOSIS — I129 Hypertensive chronic kidney disease with stage 1 through stage 4 chronic kidney disease, or unspecified chronic kidney disease: Secondary | ICD-10-CM | POA: Diagnosis present

## 2018-06-05 DIAGNOSIS — N184 Chronic kidney disease, stage 4 (severe): Secondary | ICD-10-CM

## 2018-06-05 DIAGNOSIS — R7989 Other specified abnormal findings of blood chemistry: Secondary | ICD-10-CM

## 2018-06-05 LAB — COMPREHENSIVE METABOLIC PANEL
ALT: 20 U/L (ref 0–44)
AST: 20 U/L (ref 15–41)
Albumin: 3.1 g/dL — ABNORMAL LOW (ref 3.5–5.0)
Alkaline Phosphatase: 64 U/L (ref 38–126)
Anion gap: 9 (ref 5–15)
BUN: 46 mg/dL — ABNORMAL HIGH (ref 8–23)
CHLORIDE: 112 mmol/L — AB (ref 98–111)
CO2: 18 mmol/L — ABNORMAL LOW (ref 22–32)
Calcium: 8.5 mg/dL — ABNORMAL LOW (ref 8.9–10.3)
Creatinine, Ser: 2.49 mg/dL — ABNORMAL HIGH (ref 0.61–1.24)
GFR calc non Af Amer: 24 mL/min — ABNORMAL LOW (ref >60)
GFR, EST AFRICAN AMERICAN: 27 mL/min — AB (ref >60)
Glucose, Bld: 106 mg/dL — ABNORMAL HIGH (ref 70–99)
Potassium: 4.3 mmol/L (ref 3.5–5.1)
Sodium: 139 mmol/L (ref 135–145)
Total Bilirubin: 0.3 mg/dL (ref 0.3–1.2)
Total Protein: 5.2 g/dL — ABNORMAL LOW (ref 6.5–8.1)

## 2018-06-05 LAB — IRON AND TIBC
Iron: 97 ug/dL (ref 45–182)
Saturation Ratios: 33 % (ref 17.9–39.5)
TIBC: 294 ug/dL (ref 250–450)
UIBC: 197 ug/dL

## 2018-06-05 LAB — CBC WITH DIFFERENTIAL/PLATELET
Abs Immature Granulocytes: 0.08 10*3/uL — ABNORMAL HIGH (ref 0.00–0.07)
Basophils Absolute: 0 10*3/uL (ref 0.0–0.1)
Basophils Relative: 0 %
Eosinophils Absolute: 0.1 10*3/uL (ref 0.0–0.5)
Eosinophils Relative: 1 %
HCT: 21.9 % — ABNORMAL LOW (ref 39.0–52.0)
Hemoglobin: 6.4 g/dL — ABNORMAL LOW (ref 13.0–17.0)
Immature Granulocytes: 1 %
LYMPHS PCT: 15 %
Lymphs Abs: 1.7 10*3/uL (ref 0.7–4.0)
MCH: 30.5 pg (ref 26.0–34.0)
MCHC: 29.2 g/dL — ABNORMAL LOW (ref 30.0–36.0)
MCV: 104.3 fL — ABNORMAL HIGH (ref 80.0–100.0)
Monocytes Absolute: 0.9 10*3/uL (ref 0.1–1.0)
Monocytes Relative: 8 %
Neutro Abs: 8.5 10*3/uL — ABNORMAL HIGH (ref 1.7–7.7)
Neutrophils Relative %: 75 %
Platelets: 369 10*3/uL (ref 150–400)
RBC: 2.1 MIL/uL — AB (ref 4.22–5.81)
RDW: 16.3 % — ABNORMAL HIGH (ref 11.5–15.5)
WBC: 11.2 10*3/uL — AB (ref 4.0–10.5)
nRBC: 0.2 % (ref 0.0–0.2)

## 2018-06-05 LAB — RETICULOCYTES
Immature Retic Fract: 24.1 % — ABNORMAL HIGH (ref 2.3–15.9)
RBC.: 1.9 MIL/uL — ABNORMAL LOW (ref 4.22–5.81)
RETIC COUNT ABSOLUTE: 54.7 10*3/uL (ref 19.0–186.0)
RETIC CT PCT: 2.9 % (ref 0.4–3.1)

## 2018-06-05 LAB — FERRITIN: Ferritin: 247 ng/mL (ref 24–336)

## 2018-06-05 LAB — TROPONIN I
TROPONIN I: 0.05 ng/mL — AB (ref <0.03)
Troponin I: 0.04 ng/mL (ref <0.03)
Troponin I: 0.16 ng/mL (ref <0.03)

## 2018-06-05 LAB — POCT URINALYSIS DIPSTICK
Bilirubin, UA: NEGATIVE
GLUCOSE UA: NEGATIVE
Ketones, UA: NEGATIVE
Leukocytes, UA: NEGATIVE
Nitrite, UA: NEGATIVE
Protein, UA: POSITIVE — AB
Spec Grav, UA: 1.025 (ref 1.010–1.025)
Urobilinogen, UA: 0.2 E.U./dL
pH, UA: 6 (ref 5.0–8.0)

## 2018-06-05 LAB — PREPARE RBC (CROSSMATCH)

## 2018-06-05 LAB — VITAMIN B12: Vitamin B-12: 311 pg/mL (ref 180–914)

## 2018-06-05 LAB — FOLATE: Folate: 13.8 ng/mL (ref >5.9)

## 2018-06-05 MED ORDER — ASPIRIN EC 81 MG PO TBEC
81.0000 mg | DELAYED_RELEASE_TABLET | Freq: Every day | ORAL | Status: DC
  Administered 2018-06-05 – 2018-06-07 (×3): 81 mg via ORAL
  Filled 2018-06-05 (×3): qty 1

## 2018-06-05 MED ORDER — MYCOPHENOLATE MOFETIL 250 MG PO CAPS
1000.0000 mg | ORAL_CAPSULE | Freq: Two times a day (BID) | ORAL | Status: DC
  Administered 2018-06-05 – 2018-06-06 (×3): 1000 mg via ORAL
  Filled 2018-06-05 (×4): qty 4

## 2018-06-05 MED ORDER — FINASTERIDE 5 MG PO TABS
5.0000 mg | ORAL_TABLET | Freq: Every day | ORAL | Status: DC
  Administered 2018-06-05 – 2018-06-06 (×2): 5 mg via ORAL
  Filled 2018-06-05 (×2): qty 1

## 2018-06-05 MED ORDER — FLUTICASONE PROPIONATE 50 MCG/ACT NA SUSP
2.0000 | Freq: Every day | NASAL | Status: DC
  Filled 2018-06-05: qty 16

## 2018-06-05 MED ORDER — SODIUM CHLORIDE 0.9 % IV SOLN
INTRAVENOUS | Status: DC
  Administered 2018-06-05: 17:00:00 via INTRAVENOUS

## 2018-06-05 MED ORDER — SODIUM CHLORIDE 0.9 % IV SOLN
10.0000 mL/h | Freq: Once | INTRAVENOUS | Status: AC
  Administered 2018-06-05: 10 mL/h via INTRAVENOUS

## 2018-06-05 MED ORDER — LISINOPRIL 20 MG PO TABS: 20.0000 mg | ORAL_TABLET | Freq: Every day | ORAL | 1 refills | Status: DC

## 2018-06-05 MED ORDER — CALCITRIOL 0.25 MCG PO CAPS
0.2500 ug | ORAL_CAPSULE | Freq: Every day | ORAL | Status: DC
  Administered 2018-06-05 – 2018-06-07 (×3): 0.25 ug via ORAL
  Filled 2018-06-05 (×3): qty 1

## 2018-06-05 MED ORDER — ONDANSETRON HCL 4 MG PO TABS: 4.0000 mg | ORAL_TABLET | Freq: Four times a day (QID) | ORAL | Status: DC | PRN

## 2018-06-05 MED ORDER — ATORVASTATIN CALCIUM 20 MG PO TABS
40.0000 mg | ORAL_TABLET | Freq: Every day | ORAL | Status: DC
  Administered 2018-06-05 – 2018-06-06 (×2): 40 mg via ORAL
  Filled 2018-06-05 (×2): qty 2

## 2018-06-05 MED ORDER — ACETAMINOPHEN 325 MG PO TABS: 650.0000 mg | ORAL_TABLET | Freq: Four times a day (QID) | ORAL | Status: DC | PRN

## 2018-06-05 MED ORDER — ONDANSETRON HCL 4 MG/2ML IJ SOLN: 4.0000 mg | Freq: Four times a day (QID) | INTRAMUSCULAR | Status: DC | PRN

## 2018-06-05 MED ORDER — HEPARIN SODIUM (PORCINE) 5000 UNIT/ML IJ SOLN
5000.0000 [IU] | Freq: Three times a day (TID) | INTRAMUSCULAR | Status: DC
  Administered 2018-06-05: 5000 [IU] via SUBCUTANEOUS
  Filled 2018-06-05: qty 1

## 2018-06-05 MED ORDER — ACETAMINOPHEN 650 MG RE SUPP: 650.0000 mg | Freq: Four times a day (QID) | RECTAL | Status: DC | PRN

## 2018-06-05 NOTE — H&P (Signed)
[facility] NAME: Arthur Bowen    MR#:  [id number]  DATE OF BIRTH:  04-22-1941  DATE OF ADMISSION:  06/05/2018  PRIMARY CARE PHYSICIAN: Glean Hess, MD   REQUESTING/REFERRING PHYSICIAN: Larae Grooms MD  CHIEF COMPLAINT:   Chief Complaint  Patient presents with  . Bradycardia    HISTORY OF PRESENT ILLNESS: Arthur Bowen  is a 77 y.o. male with a known history of history of MI status post CABG as well as chronic kidney disease who is presenting to the emergency room with complaint of weakness and fall.  Patient states that he went to his nephrologist this morning when he got out of the car he felt very weak and had to sit down.  Patient then called EMS when he arrived here he was noted to have heart rate in the 30s and 40s.  Blood pressure was in the 100s.  Patient otherwise denies any chest pain shortness of breath no nausea vomiting or diarrhea.  He does take metoprolol at home. Patient also has a history of anemia which is chronic but usually in the 8 range now noticed to be low at 6.4.  Patient denies any blood in the stool  PAST MEDICAL HISTORY:   Past Medical History:  Diagnosis Date  . Acquired phimosis   . BPH (benign prostatic hypertrophy)   . Chronic kidney disease   . Dyspnea    with exertion  . Elevated PSA   . Gross hematuria   . Hematuria   . History of hiatal hernia   . Hypertension   . 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 Renal mass, left     PAST SURGICAL HISTORY:  Past Surgical History:  Procedure Laterality Date  . Circumscision  09/2015  . CORONARY ARTERY BYPASS GRAFT  2002   x3 @ [place]  . INGUINAL HERNIA REPAIR Left 2011  . RENAL BIOPSY  2014   neg in w/u renal mass/hematuria    SOCIAL HISTORY:  Social History   Tobacco Use  . Smoking status: Former Smoker    Packs/day: 0.50    Years: 50.00    Pack years: 25.00    Types: Cigarettes    Last attempt to quit: 10/10/2017   Years since quitting: 0.6  . Smokeless tobacco: Former Systems developer    Types: Chew  . Tobacco comment: last cig 10/10/17   Substance Use Topics  . Alcohol use: No    Alcohol/week: 0.0 standard drinks    FAMILY HISTORY:  Family History  Problem Relation Age of Onset  . Lymphoma Mother   . Hodgkin's lymphoma Mother   . Lung cancer Father   . Heart disease Brother   . Dementia Sister   . Dementia Brother   . Breast cancer Maternal Aunt     DRUG ALLERGIES:  Allergies  Allergen Reactions  . Penicillins Other (See Comments)  . Sulfa Antibiotics Other (See Comments)    REVIEW OF SYSTEMS:   CONSTITUTIONAL: No fever, positive fatigue or positive weakness.  EYES: No blurred or double vision.  EARS, NOSE, AND THROAT: No tinnitus or ear pain.  RESPIRATORY: No cough, shortness of breath, wheezing or hemoptysis.  CARDIOVASCULAR: No chest pain, orthopnea, edema.  GASTROINTESTINAL: No nausea, vomiting, diarrhea or abdominal pain.  GENITOURINARY: No dysuria, hematuria.  ENDOCRINE: No polyuria, nocturia,  HEMATOLOGY: No anemia, easy bruising or bleeding SKIN: No rash or lesion. MUSCULOSKELETAL: No joint pain or arthritis.  NEUROLOGIC: No tingling, numbness, weakness.  PSYCHIATRY: No anxiety or depression.   MEDICATIONS AT HOME:  Prior to Admission medications   Medication Sig Start Date End Date Taking? Authorizing Provider  amLODipine (NORVASC) 5 MG tablet Take 1 tablet (5 mg total) by mouth daily. 06/03/17  Yes Glean Hess, MD  aspirin 81 MG tablet Take 1 tablet by mouth daily. Reported on 09/02/2015   Yes [provider]  atorvastatin (LIPITOR) 40 MG tablet TAKE 1 TABLET BY MOUTH ONCE DAILY 11/24/17  Yes Glean Hess, MD  calcitRIOL (ROCALTROL) 0.25 MCG capsule Take 0.25 mcg by mouth daily.   Yes [provider]  Cyanocobalamin (VITAMIN B 12 PO) Take by mouth daily.   Yes [provider]  finasteride (PROSCAR) 5 MG tablet TAKE 1 TABLET BY MOUTH ONCE  DAILY 09/13/17  Yes Glean Hess, MD  fluticasone [facility]) 50 MCG/ACT nasal spray Place 2 sprays into both nostrils daily. 10/24/15  Yes Glean Hess, MD  hydrALAZINE (APRESOLINE) 50 MG tablet Take 50 mg by mouth 3 (three) times daily.   Yes [provider]  lisinopril (PRINIVIL,ZESTRIL) 20 MG tablet Take 1 tablet (20 mg total) by mouth daily. 06/05/18  Yes Glean Hess, MD  metoprolol tartrate (LOPRESSOR) 50 MG tablet Take 1 tablet (50 mg total) by mouth 2 (two) times daily. 06/03/17  Yes Glean Hess, MD  mycophenolate (CELLCEPT) 500 MG tablet Take 1,000 mg by mouth 2 (two) times daily. 09/29/17  Yes [provider]  nitroGLYCERIN (NITROSTAT) 0.4 MG SL tablet Place 1 tablet (0.4 mg total) under the tongue every 5 (five) minutes as needed for chest pain. 04/07/18  Yes Glean Hess, MD      PHYSICAL EXAMINATION:   VITAL SIGNS: Blood pressure (!) 128/51, pulse (!) 36, resp. rate 19, SpO2 100 %.  GENERAL:  77 y.o.-year-old patient lying in the bed with no acute distress.  EYES: Pupils equal, round, reactive to light and accommodation. No scleral icterus. Extraocular muscles intact.  HEENT: Head atraumatic, normocephalic. Oropharynx and nasopharynx clear.  NECK:  Supple, no jugular venous distention. No thyroid enlargement, no tenderness.  LUNGS: Normal breath sounds bilaterally, no wheezing, rales,rhonchi or crepitation. No use of accessory muscles of respiration.  CARDIOVASCULAR: S1, S2 normal. No murmurs, rubs, or gallops.  ABDOMEN: Soft, nontender, nondistended. Bowel sounds present. No organomegaly or mass.  EXTREMITIES: No pedal edema, cyanosis, or clubbing.  NEUROLOGIC: Cranial nerves II through XII are intact. Muscle strength 5/5 in all extremities. Sensation intact. Gait not checked.  PSYCHIATRIC: The patient is alert and oriented x 3.  SKIN: No obvious rash, lesion, or ulcer.   LABORATORY PANEL:   CBC Recent Labs  Lab 06/05/18 1022  WBC  11.2*  HGB 6.4*  HCT 21.9*  PLT 369  MCV 104.3*  MCH 30.5  MCHC 29.2*  RDW 16.3*  LYMPHSABS 1.7  MONOABS 0.9  EOSABS 0.1  BASOSABS 0.0   ------------------------------------------------------------------------------------------------------------------  Chemistries  Recent Labs  Lab 06/05/18 1022  NA 139  K 4.3  CL 112*  CO2 18*  GLUCOSE 106*  BUN 46*  CREATININE 2.49*  CALCIUM 8.5*  AST 20  ALT 20  ALKPHOS 64  BILITOT 0.3   ------------------------------------------------------------------------------------------------------------------ estimated creatinine clearance is 26.4 mL/min (A) (by C-G formula based on SCr of 2.49 mg/dL (H)). ------------------------------------------------------------------------------------------------------------------ No results for input(s): TSH, T4TOTAL, T3FREE, THYROIDAB in the last 72 hours.  Invalid input(s): FREET3   Coagulation profile No results for input(s): INR, PROTIME  in the last 168 hours. ------------------------------------------------------------------------------------------------------------------- No results for input(s): DDIMER in the last 72 hours. -------------------------------------------------------------------------------------------------------------------  Cardiac Enzymes Recent Labs  Lab 06/05/18 1022  TROPONINI 0.04*   ------------------------------------------------------------------------------------------------------------------ Invalid input(s): POCBNP  ---------------------------------------------------------------------------------------------------------------  Urinalysis    Component Value Date/Time   COLORURINE YELLOW (A) 07/29/2017 0859   APPEARANCEUR HAZY (A) 07/29/2017 0859   APPEARANCEUR Turbid (A) 04/19/2016 1003   LABSPEC 1.017 07/29/2017 0859   PHURINE 5.0 07/29/2017 0859   GLUCOSEU 50 (A) 07/29/2017 0859   HGBUR NEGATIVE 07/29/2017 0859   BILIRUBINUR neg 06/05/2018 [place] 07/29/2017 0859   PROTEINUR Positive (A) 06/05/2018 1157   PROTEINUR >=300 (A) 07/29/2017 0859   UROBILINOGEN 0.2 06/05/2018 1157   NITRITE neg 06/05/2018 1157   NITRITE NEGATIVE 07/29/2017 0859   LEUKOCYTESUR Negative 06/05/2018 1157     RADIOLOGY: Dg Chest 1 View  Result Date: 06/05/2018 CLINICAL DATA:  Weakness, bradycardia EXAM: CHEST  1 VIEW COMPARISON:  10/09/2017 FINDINGS: Prior CABG. Cardiomegaly. Mild vascular congestion and hyperinflation. No confluent opacities or effusions. IMPRESSION: Mild cardiomegaly and vascular congestion. Mild hyperinflation. No acute findings. Electronically Signed   By: Rolm Baptise M.D.   On: 06/05/2018 10:41    EKG: Orders placed or performed during the hospital encounter of 06/05/18  . EKG 12-Lead  . EKG 12-Lead    IMPRESSION AND PLAN: Patient is a 77 year old presenting with generalized weakness  1.  Symptomatic bradycardia could be related to metoprolol that he is taking we will discontinue metoprolol I have discussed with cardiology who has already evaluated the patient We can use atropine as needed for severe bradycardia with symptoms currently is stable with normal blood pressure  2.  Acute on chronic anemia suspect this is worsening anemia chronic disease I will check an anemia panel again.  We will guaiac stools if positive will need GI evaluation at this point he received 1 unit of packed RBCs may need further transfusions  3.  Hypertension patient's blood pressure is low normal here I will not order his Norvasc or his hydralazine and lisinopril  4.  Coronary artery disease continue aspirin unless there is any active sign of bleeding  5.  Hyperlipidemia continue Lipitor  6.  BPH continue Proscar  7.  7 yes SCDs for DVT prophylaxis All the records are reviewed and case discussed with ED provider. Management plans discussed with the patient, family and they are in agreement.  CODE STATUS: Code Status History     Date Active Date Inactive Code Status Order ID Comments User Context   07/31/2017 1018 08/01/2017 1319 Full Code [id number]  Vaughan Basta, MD Inpatient       TOTAL TIME TAKING CARE OF THIS PATIENT: 55 minutes.    Dustin Flock M.D on 06/05/2018 at 12:09 PM  Between 7am to 6pm - Pager - [phone]  After 6pm go to [email or web address] - password [email or web address] [facility]  [phone]  CC: Primary care physician; Glean Hess, MD

## 2018-06-05 NOTE — ED Provider Notes (Signed)
[facility] Emergency Department Provider Note  ____________________________________________   First MD Initiated Contact with Patient 06/05/18 1019     (approximate)  I have reviewed the triage vital signs and the nursing notes.   HISTORY  Chief Complaint Bradycardia   HPI Arthur Bowen is a 77 y.o. male with a history of MI status post CABG, as well as CKD who is presenting the emergency department with weakness and a fall.  He states that he was going to his nephrologist this morning.  When getting out of the car he felt very weak, all over, he lowered himself to the ground and sustained a skin tear to his right forearm.  Says that he is up-to-date with his tetanus shots.  Denying any pain at this time.  EMS called and found to be bradycardic to the 30s and 40s.  Blood pressure above 100.  Mentating appropriately.  Questionable junctional rhythm on prehospital EKG.   Past Medical History:  Diagnosis Date  . Acquired phimosis   . BPH (benign prostatic hypertrophy)   . Chronic kidney disease   . Dyspnea    with exertion  . Elevated PSA   . Gross hematuria   . Hematuria   . History of hiatal hernia   . Hypertension   . 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 Renal mass, left     Patient Active Problem List   Diagnosis Date Noted  . Anemia in stage 4 chronic kidney disease ([place]) 11/21/2017  . Secondary hyperparathyroidism of renal origin ([place]) 10/15/2017  . CKD stage 4 secondary to hypertension ([place]) 10/09/2017  . Primary osteoarthritis of right knee 01/09/2017  . Elevated PSA 08/14/2015  . Renal mass 08/14/2015  . BPH with obstruction/lower urinary tract symptoms 08/14/2015  . History of hematuria 08/14/2015  . Coronary artery disease involving native coronary artery of native heart with angina pectoris ([place]) 04/06/2015  . Dyslipidemia 04/06/2015  . Essential (primary) hypertension 04/06/2015  . Neuropathy 04/06/2015  . Peripheral  vascular disease ([place]) 04/06/2015  . 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 Senile purpura ([place]) 04/06/2015    Past Surgical History:  Procedure Laterality Date  . Circumscision  09/2015  . CORONARY ARTERY BYPASS GRAFT  2002   x3 @ [place]  . INGUINAL HERNIA REPAIR Left 2011  . RENAL BIOPSY  2014   neg in w/u renal mass/hematuria    Prior to Admission medications   Medication Sig Start Date End Date Taking? Authorizing Provider  amLODipine (NORVASC) 5 MG tablet Take 1 tablet (5 mg total) by mouth daily. 06/03/17   Glean Hess, MD  aspirin 81 MG tablet Take 1 tablet by mouth daily. Reported on 09/02/2015    [provider]  atorvastatin (LIPITOR) 40 MG tablet TAKE 1 TABLET BY MOUTH ONCE DAILY 11/24/17   Glean Hess, MD  calcitRIOL (ROCALTROL) 0.25 MCG capsule Take 0.25 mcg by mouth daily.    [provider]  Cyanocobalamin (VITAMIN B 12 PO) Take by mouth daily.    [provider]  finasteride (PROSCAR) 5 MG tablet TAKE 1 TABLET BY MOUTH ONCE DAILY 09/13/17   Glean Hess, MD  fluticasone [facility]) 50 MCG/ACT nasal spray Place 2 sprays into both nostrils daily. 10/24/15   Glean Hess, MD  hydrALAZINE (APRESOLINE) 50 MG tablet Take 50 mg by mouth 3 (three) times daily.    [provider]  lisinopril (PRINIVIL,ZESTRIL) 20 MG tablet Take 1 tablet (20 mg  total) by mouth daily. 06/05/18   Glean Hess, MD  metoprolol tartrate (LOPRESSOR) 50 MG tablet Take 1 tablet (50 mg total) by mouth 2 (two) times daily. 06/03/17   Glean Hess, MD  mycophenolate (CELLCEPT) 500 MG tablet Take 1,000 mg by mouth 2 (two) times daily. 09/29/17   [provider]  nitroGLYCERIN (NITROSTAT) 0.4 MG SL tablet Place 1 tablet (0.4 mg total) under the tongue every 5 (five) minutes as needed for chest pain. 04/07/18   Glean Hess, MD    Allergies Penicillins and Sulfa antibiotics  Family History  Problem Relation Age of Onset    . Lymphoma Mother   . Hodgkin's lymphoma Mother   . Lung cancer Father   . Heart disease Brother   . Dementia Sister   . Dementia Brother   . Breast cancer Maternal Aunt     Social History Social History   Tobacco Use  . Smoking status: Former Smoker    Packs/day: 0.50    Years: 50.00    Pack years: 25.00    Types: Cigarettes    Last attempt to quit: 10/10/2017    Years since quitting: 0.6  . Smokeless tobacco: Former Systems developer    Types: Chew  . Tobacco comment: last cig 10/10/17   Substance Use Topics  . Alcohol use: No    Alcohol/week: 0.0 standard drinks  . Drug use: No    Review of Systems  Constitutional: No fever/chills Eyes: No visual changes. ENT: No sore throat. Cardiovascular: Denies chest pain. Respiratory: Denies shortness of breath. Gastrointestinal: No abdominal pain.  No nausea, no vomiting.  No diarrhea.  No constipation. Genitourinary: Negative for dysuria. Musculoskeletal: Negative for back pain. Skin: Negative for rash. Neurological: Negative for headaches, focal weakness or numbness.   ____________________________________________   PHYSICAL EXAM:  VITAL SIGNS: ED Triage Vitals  Enc Vitals Group     BP 06/05/18 1016 109/70     Pulse Rate 06/05/18 1016 (!) 41     Resp 06/05/18 1016 14     Temp --      Temp src --      SpO2 --      Weight --      Height --      Head Circumference --      Peak Flow --      Pain Score 06/05/18 1017 0     Pain Loc --      Pain Edu? --      Excl. in [place]? --     Constitutional: Alert and oriented. Well appearing and in no acute distress. Eyes: Conjunctivae are normal.  Head: Atraumatic. Nose: No congestion/rhinnorhea. Mouth/Throat: Mucous membranes are moist.  Neck: No stridor.   Cardiovascular: Bradycardic, regular rhythm. Grossly normal heart sounds.   Respiratory: Normal respiratory effort.  No retractions. Lungs CTAB. Gastrointestinal: Soft and nontender. No distention. No CVA tenderness.  Digital  rectal exam with brown stool which is heme positive.  No external hemorrhoids visualized. Musculoskeletal: No lower extremity tenderness nor edema.  No joint effusions. Neurologic:  Normal speech and language. No gross focal neurologic deficits are appreciated. Skin: 1 x 2 cm superficial skin tear to the right forearm.  No active bleeding at this time.  No surrounding erythema, induration or exudate. Psychiatric: Mood and affect are normal. Speech and behavior are normal.  ____________________________________________   LABS (all labs ordered are listed, but only abnormal results are displayed)  Labs Reviewed  CBC WITH DIFFERENTIAL/PLATELET - Abnormal;  Notable for the following components:      Result Value   WBC 11.2 (*)    RBC 2.10 (*)    Hemoglobin 6.4 (*)    HCT 21.9 (*)    MCV 104.3 (*)    MCHC 29.2 (*)    RDW 16.3 (*)    Neutro Abs 8.5 (*)    Abs Immature Granulocytes 0.08 (*)    All other components within normal limits  COMPREHENSIVE METABOLIC PANEL - Abnormal; Notable for the following components:   Chloride 112 (*)    CO2 18 (*)    Glucose, Bld 106 (*)    BUN 46 (*)    Creatinine, Ser 2.49 (*)    Calcium 8.5 (*)    Total Protein 5.2 (*)    Albumin 3.1 (*)    GFR calc non Af Amer 24 (*)    GFR calc Af Amer 27 (*)    All other components within normal limits  TROPONIN I - Abnormal; Notable for the following components:   Troponin I 0.04 (*)    All other components within normal limits  URINALYSIS, COMPLETE (UACMP) WITH MICROSCOPIC   ____________________________________________  EKG  ED ECG REPORT I, Doran Stabler, the attending physician, personally viewed and interpreted this ECG.   Date: 06/05/2018  EKG Time: 1020  Rate: 40  Rhythm: sinus bradycardia  Axis: Normal  Intervals:right bundle branch block  ST&T Change: No ST segment elevation or depression.  No abnormal T wave  inversion.  ____________________________________________  RADIOLOGY  Chest x-ray with mild cardiomegaly and vascular congestion.  No other acute findings. ____________________________________________   PROCEDURES  Procedure(s) performed:   .Critical Care Performed by: Orbie Pyo, MD Authorized by: Orbie Pyo, MD   Critical care provider statement:    Critical care time (minutes):  35   Critical care time was exclusive of:  Separately billable procedures and treating other patients   Critical care was necessary to treat or prevent imminent or life-threatening deterioration of the following conditions: symptomatic anemia requiring transfusion.    Critical care was time spent personally by me on the following activities:  Development of treatment plan with patient or surrogate, discussions with consultants, evaluation of patient's response to treatment, examination of patient, obtaining history from patient or surrogate, ordering and performing treatments and interventions, ordering and review of laboratory studies, ordering and review of radiographic studies, pulse oximetry, re-evaluation of patient's condition and review of old charts    Critical Care performed:    ____________________________________________   INITIAL IMPRESSION / [place] / ED COURSE  Pertinent labs & imaging results that were available during my care of the patient were reviewed by me and considered in my medical decision making (see chart for details).  DDX: Symptomatic bradycardia, anemia, GI bleeding, electrolyte abnormality, kidney failure, acute on chronic kidney failure, skin tear, generalized weakness, symptomatic anemia As part of my medical decision making, I reviewed the following data within the electronic MEDICAL RECORD NUMBER Notes from prior outpatient visits.  Patient appears to be acutely anemic.  Also with worsening kidney failure.  Patient reports taking  aspirin and Plavix although I do not see Plavix on his medication list.  ----------------------------------------- 11:56 AM on 06/05/2018 -----------------------------------------  Patient is persistently bradycardic with heart rate in the 30s and 40s.  Daughter is at the bedside who is a paramedic.  I updated the patient as well as the daughter regarding need for admission to the hospital and blood transfusion.  Possible pacemaker evaluation as well.  Seen with also worsening renal failure.  Signed out to the hospitalist.  Will transfuse 1 unit slowly. ____________________________________________   FINAL CLINICAL IMPRESSION(S) / ED DIAGNOSES  Symptomatic anemia.  Symptomatic bradycardia.  GI bleeding.  Acute on chronic renal failure.  NEW MEDICATIONS STARTED DURING THIS VISIT:  New Prescriptions   No medications on file     Note:  This document was prepared using Dragon voice recognition software and may include unintentional dictation errors.     Orbie Pyo, MD 06/05/18 [phone]

## 2018-06-05 NOTE — Consult Note (Signed)
[facility] Consultation Note  Patient ID: Arthur Bowen, MRN: [id number], DOB/AGE: August 23, 1940 77 y.o. Admit date: 06/05/2018   Date of Consult: 06/05/2018 Primary Physician: Glean Hess, MD Primary Cardiologist: None  Chief Complaint:  Chief Complaint  Patient presents with  . Bradycardia   Reason for Consult: Bradycardia  HPI: 77 y.o. male with known coronary artery disease status post coronary bypass graft 2005 with chronic kidney disease stage IV essential hypertension mixed hyperlipidemia on appropriate medication management.  The patient has had some weakness and taken for shortness of breath over the last several weeks to a month for which he has had worsening kidney dysfunction and requiring further treatment.  The patient additionally has had worsening anemia which appears to be from kidney dysfunction.  The patient has been using iron supplement.  Today he came in with worsening shortness of breath weakness fatigue and bradycardia.  When arrived he has sinus bradycardia with heart rate of 40 bpm and right bundle branch block unchanged over the course of watching him.  He does not have any chest pain or congestive heart failure symptoms although chest x-ray does show mild lower lung edema and troponin 0 0.04.  The patient feels comfortable at this time.  There is been no further advanced heart block or other telemetry changes  Past Medical History:  Diagnosis Date  . Acquired phimosis   . BPH (benign prostatic hypertrophy)   . Chronic kidney disease   . Dyspnea    with exertion  . Elevated PSA   . Gross hematuria   . Hematuria   . History of hiatal hernia   . Hypertension   . 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 Renal mass, left       Surgical History:  Past Surgical History:  Procedure Laterality Date  . Circumscision  09/2015  . CORONARY ARTERY BYPASS GRAFT  2002   x3 @ [place]  . INGUINAL HERNIA REPAIR Left 2011  . RENAL BIOPSY  2014    neg in w/u renal mass/hematuria     Home Meds: Prior to Admission medications   Medication Sig Start Date End Date Taking? Authorizing Provider  amLODipine (NORVASC) 5 MG tablet Take 1 tablet (5 mg total) by mouth daily. 06/03/17  Yes Glean Hess, MD  aspirin 81 MG tablet Take 1 tablet by mouth daily. Reported on 09/02/2015   Yes [provider]  atorvastatin (LIPITOR) 40 MG tablet TAKE 1 TABLET BY MOUTH ONCE DAILY 11/24/17  Yes Glean Hess, MD  calcitRIOL (ROCALTROL) 0.25 MCG capsule Take 0.25 mcg by mouth daily.   Yes [provider]  Cyanocobalamin (VITAMIN B 12 PO) Take by mouth daily.   Yes [provider]  finasteride (PROSCAR) 5 MG tablet TAKE 1 TABLET BY MOUTH ONCE DAILY 09/13/17  Yes Glean Hess, MD  fluticasone [facility]) 50 MCG/ACT nasal spray Place 2 sprays into both nostrils daily. 10/24/15  Yes Glean Hess, MD  hydrALAZINE (APRESOLINE) 50 MG tablet Take 50 mg by mouth 3 (three) times daily.   Yes [provider]  lisinopril (PRINIVIL,ZESTRIL) 20 MG tablet Take 1 tablet (20 mg total) by mouth daily. 06/05/18  Yes Glean Hess, MD  metoprolol tartrate (LOPRESSOR) 50 MG tablet Take 1 tablet (50 mg total) by mouth 2 (two) times daily. 06/03/17  Yes Glean Hess, MD  mycophenolate (CELLCEPT) 500 MG tablet Take 1,000 mg by mouth 2 (two) times daily. 09/29/17  Yes  [provider]  nitroGLYCERIN (NITROSTAT) 0.4 MG SL tablet Place 1 tablet (0.4 mg total) under the tongue every 5 (five) minutes as needed for chest pain. 04/07/18  Yes Glean Hess, MD    Inpatient Medications:     Allergies:  Allergies  Allergen Reactions  . Penicillins Other (See Comments)  . Sulfa Antibiotics Other (See Comments)    Social History   Socioeconomic History  . Marital status: Divorced    Spouse name: Not on file  . Number of children: 2  . Years of education: Not on file  . Highest education level: Not on file   Occupational History  . Not on file  Social Needs  . Financial resource strain: Patient refused  . Food insecurity:    Worry: Patient refused    Inability: Patient refused  . Transportation needs:    Medical: Patient refused    Non-medical: Patient refused  Tobacco Use  . Smoking status: Former Smoker    Packs/day: 0.50    Years: 50.00    Pack years: 25.00    Types: Cigarettes    Last attempt to quit: 10/10/2017    Years since quitting: 0.6  . Smokeless tobacco: Former Systems developer    Types: Chew  . Tobacco comment: last cig 10/10/17   Substance and Sexual Activity  . Alcohol use: No    Alcohol/week: 0.0 standard drinks  . Drug use: No  . Sexual activity: Not on file  Lifestyle  . Physical activity:    Days per week: Patient refused    Minutes per session: Patient refused  . Stress: Patient refused  Relationships  . Social connections:    Talks on phone: Patient refused    Gets together: Patient refused    Attends religious service: Patient refused    Active member of club or organization: Patient refused    Attends meetings of clubs or organizations: Patient refused    Relationship status: Patient refused  . Intimate partner violence:    Fear of current or ex partner: Patient refused    Emotionally abused: Patient refused    Physically abused: Patient refused    Forced sexual activity: Patient refused  Other Topics Concern  . Not on file  Social History Narrative   Patient refused all questions- 06/03/2017     Family History  Problem Relation Age of Onset  . Lymphoma Mother   . Hodgkin's lymphoma Mother   . Lung cancer Father   . Heart disease Brother   . Dementia Sister   . Dementia Brother   . Breast cancer Maternal Aunt      Review of Systems Positive for shortness of breath weakness fatigue Negative for: General:  chills, fever, night sweats or weight changes.  Cardiovascular: PND orthopnea syncope dizziness  Dermatological skin lesions  rashes Respiratory: Cough congestion Urologic: Frequent urination urination at night and hematuria Abdominal: negative for nausea, vomiting, diarrhea, bright red blood per rectum, melena, or hematemesis Neurologic: negative for visual changes, and/or hearing changes  All other systems reviewed and are otherwise negative except as noted above.  Labs: Recent Labs    06/05/18 1022  TROPONINI 0.04*   Lab Results  Component Value Date   WBC 11.2 (H) 06/05/2018   HGB 6.4 (L) 06/05/2018   HCT 21.9 (L) 06/05/2018   MCV 104.3 (H) 06/05/2018   PLT 369 06/05/2018    Recent Labs  Lab 06/05/18 1022  NA 139  K 4.3  CL 112*  CO2 18*  BUN 46*  CREATININE 2.49*  CALCIUM 8.5*  PROT 5.2*  BILITOT 0.3  ALKPHOS 64  ALT 20  AST 20  GLUCOSE 106*   Lab Results  Component Value Date   CHOL 185 06/03/2017   HDL 34 (L) 06/03/2017   LDLCALC 111 (H) 06/03/2017   TRIG 198 (H) 06/03/2017   No results found for: DDIMER  Radiology/Studies:  Dg Chest 1 View  Result Date: 06/05/2018 CLINICAL DATA:  Weakness, bradycardia EXAM: CHEST  1 VIEW COMPARISON:  10/09/2017 FINDINGS: Prior CABG. Cardiomegaly. Mild vascular congestion and hyperinflation. No confluent opacities or effusions. IMPRESSION: Mild cardiomegaly and vascular congestion. Mild hyperinflation. No acute findings. Electronically Signed   By: Rolm Baptise M.D.   On: 06/05/2018 10:41    EKG: Sinus bradycardia with right bundle branch block  Weights: There were no vitals filed for this visit.   Physical Exam: Blood pressure (!) 125/51, pulse 97, resp. rate 14, SpO2 94 %. There is no height or weight on file to calculate BMI. General: Well developed, well nourished, in no acute distress. Head eyes ears nose throat: Normocephalic, atraumatic, sclera non-icteric, no xanthomas, nares are without discharge. No apparent thyromegaly and/or mass  Lungs: Normal respiratory effort.  Few wheezes, few basilar rales, no rhonchi.  Heart: RRR  with normal S1 S2. no murmur gallop, no rub, PMI is normal size and placement, carotid upstroke normal without bruit, jugular venous pressure is normal Abdomen: Soft, non-tender, non-distended with normoactive bowel sounds. No hepatomegaly. No rebound/guarding. No obvious abdominal masses. Abdominal aorta is normal size without bruit Extremities: Trace edema. no cyanosis, no clubbing, no ulcers  Peripheral : 2+ bilateral upper extremity pulses, 2+ bilateral femoral pulses, 2+ bilateral dorsal pedal pulse Neuro: Alert and oriented. No facial asymmetry. No focal deficit. Moves all extremities spontaneously. Musculoskeletal: Normal muscle tone without kyphosis Psych:  Responds to questions appropriately with a normal affect.    Assessment: 77 year old male with known coronary artery disease status post coronary bypass graft chronic kidney disease stage IV hypertension hyperlipidemia having sinus bradycardia weakness fatigue and severe anemia multifactorial in nature needing further treatment  Plan: 1.  Abstain from beta-blocker due to significant bradycardia possibly contributing to weakness fatigue and watching for further advanced heart block needing further medication management or treatment options 2.  Continue antihypertensive medications for hypertension control avoiding beta-blocker 3.  Diuresis if able with chronic kidney disease and mild pulmonary edema 4.  Further investigation and treatment options of severe anemia 5.  Serial ECG and enzymes to assess for possible myocardial infarction 6.  Echocardiogram for LV systolic dysfunction and further treatment of his necessary 7.  Further treatment options after above  Signed, Corey Skains M.D. [facility] 06/05/2018, 1:56 PM

## 2018-06-05 NOTE — Patient Instructions (Signed)
Your blood pressure is too low - I want you to reduce the dose of lisinopril from 40 mg to 20 mg once a day.  I will send a new prescription to the pharmacy.

## 2018-06-05 NOTE — Progress Notes (Signed)
Date:  06/05/2018   Name:  Arthur Bowen   DOB:  28-Sep-1940   MRN:  [id number]   Chief Complaint: Annual Exam; Hypotension; and Knee Pain (Left knee but both bother him. Said his legs are giving out on him. I noticed his gait is off, and his left knee bends outwards as he is walking.) Arthur Bowen is a 77 y.o. male who presents today for his Complete Annual Exam. He feels fairly well. He reports exercising none due to knee pain and general weakness. He reports he is sleeping well.  He has completed his pneumonia vaccine series, he received influenza vaccine last month.  Hypertension  This is a chronic problem. Associated symptoms include shortness of breath. Pertinent negatives include no chest pain, headaches or palpitations. Past treatments include beta blockers, calcium channel blockers, ACE inhibitors and direct vasodilators. The current treatment provides significant improvement. Hypertensive end-organ damage includes kidney disease and CAD/MI.  Hyperlipidemia  The problem is controlled. Associated symptoms include shortness of breath. Pertinent negatives include no chest pain. Current antihyperlipidemic treatment includes statins. The current treatment provides significant improvement of lipids.  CKD - followed by nephrology, has HTN and FSGN.  He is now on Cellcept which he thinks causes diarrhea.  CAD - he is s/p CABG in 2002, on aspirin and beta blocker with edema, orthopnea or change in activity level.  He has had a few episodes of mild chest pain relieved by one NTG.  He is scheduled to see a cardiologist in January.  Chronic anemia - now being followed by Hematology.  He is receiving Epogen injections.  Elevated PSA, renal mass - followed by Urology. Last [place] in June showed no significant change.  Urine has been positive for RBCs but pt does not see blood.  Review of Systems  Constitutional: Negative for chills, diaphoresis, fatigue and fever.  HENT: Positive for postnasal drip.  Negative for trouble swallowing.   Eyes: Negative for visual disturbance.  Respiratory: Positive for shortness of breath and wheezing. Negative for cough and chest tightness.   Cardiovascular: Negative for chest pain, palpitations and leg swelling.  Gastrointestinal: Positive for diarrhea (since starting cellcept). Negative for anal bleeding and blood in stool.  Genitourinary: Negative for difficulty urinating and hematuria.  Musculoskeletal: Positive for arthralgias (knee pain).  Skin: Negative for color change and rash.  Neurological: Positive for light-headedness. Negative for dizziness and headaches.  Hematological: Negative for adenopathy. Does not bruise/bleed easily.  Psychiatric/Behavioral: Negative for dysphoric mood and sleep disturbance. The patient is not nervous/anxious.     Patient Active Problem List   Diagnosis Date Noted  . Anemia in stage 4 chronic kidney disease ([place]) 11/21/2017  . Secondary hyperparathyroidism of renal origin ([place]) 10/15/2017  . CKD stage 4 secondary to hypertension ([place]) 10/09/2017  . Primary osteoarthritis of right knee 01/09/2017  . Elevated PSA 08/14/2015  . Renal mass 08/14/2015  . BPH with obstruction/lower urinary tract symptoms 08/14/2015  . History of hematuria 08/14/2015  . Coronary artery disease involving native coronary artery of native heart with angina pectoris ([place]) 04/06/2015  . Dyslipidemia 04/06/2015  . Essential (primary) hypertension 04/06/2015  . Neuropathy 04/06/2015  . Peripheral vascular disease ([place]) 04/06/2015  . 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 Senile purpura ([place]) 04/06/2015    Allergies  Allergen Reactions  . Penicillins Other (See Comments)  . Sulfa Antibiotics Other (See Comments)    Past Surgical History:  Procedure Laterality Date  . Circumscision  09/2015  .  CORONARY ARTERY BYPASS GRAFT  2002   x3 @ [place]  . INGUINAL HERNIA REPAIR Left 2011  . RENAL BIOPSY  2014   neg in w/u renal  mass/hematuria    Social History   Tobacco Use  . Smoking status: Former Smoker    Packs/day: 0.50    Years: 50.00    Pack years: 25.00    Types: Cigarettes    Last attempt to quit: 10/10/2017    Years since quitting: 0.6  . Smokeless tobacco: Former Systems developer    Types: Chew  . Tobacco comment: last cig 10/10/17   Substance Use Topics  . Alcohol use: No    Alcohol/week: 0.0 standard drinks  . Drug use: No     Medication list has been reviewed and updated.  Current Meds  Medication Sig  . amLODipine (NORVASC) 5 MG tablet Take 1 tablet (5 mg total) by mouth daily.  Marland Kitchen aspirin 81 MG tablet Take 1 tablet by mouth daily. Reported on 09/02/2015  . atorvastatin (LIPITOR) 40 MG tablet TAKE 1 TABLET BY MOUTH ONCE DAILY  . calcitRIOL (ROCALTROL) 0.25 MCG capsule Take 0.25 mcg by mouth daily.  . Cyanocobalamin (VITAMIN B 12 PO) Take by mouth daily.  . finasteride (PROSCAR) 5 MG tablet TAKE 1 TABLET BY MOUTH ONCE DAILY  . fluticasone (FLONASE) 50 MCG/ACT nasal spray Place 2 sprays into both nostrils daily.  . hydrALAZINE (APRESOLINE) 50 MG tablet Take 50 mg by mouth 3 (three) times daily.  . metoprolol tartrate (LOPRESSOR) 50 MG tablet Take 1 tablet (50 mg total) by mouth 2 (two) times daily.  . mycophenolate (CELLCEPT) 500 MG tablet Take 1,000 mg by mouth 2 (two) times daily.  . nitroGLYCERIN (NITROSTAT) 0.4 MG SL tablet Place 1 tablet (0.4 mg total) under the tongue every 5 (five) minutes as needed for chest pain.  . [DISCONTINUED] lisinopril (PRINIVIL,ZESTRIL) 40 MG tablet TAKE 1 TABLET BY MOUTH ONCE DAILY    PHQ 2/9 Scores 06/05/2018 06/05/2018 12/02/2017 05/30/2016  PHQ - 2 Score 0 0 0 0    Physical Exam Vitals signs and nursing note reviewed.  Constitutional:      Appearance: Normal appearance. He is well-developed.  HENT:     Head: Normocephalic.     Right Ear: Tympanic membrane, ear canal and external ear normal.     Left Ear: Tympanic membrane, ear canal and external ear  normal.     Nose: Nose normal.     Mouth/Throat:     Pharynx: Uvula midline.  Eyes:     Conjunctiva/sclera: Conjunctivae normal.     Pupils: Pupils are equal, round, and reactive to light.  Neck:     Musculoskeletal: Normal range of motion and neck supple.     Thyroid: No thyromegaly.     Vascular: No carotid bruit.  Cardiovascular:     Rate and Rhythm: Normal rate and regular rhythm.     Heart sounds: Normal heart sounds.  Pulmonary:     Effort: Pulmonary effort is normal.     Breath sounds: Normal breath sounds. No wheezing or rales.  Chest:     Breasts:        Right: No mass.        Left: No mass.  Abdominal:     General: Bowel sounds are normal.     Palpations: Abdomen is soft.     Tenderness: There is no abdominal tenderness.  Musculoskeletal: Normal range of motion.  Lymphadenopathy:     Cervical: No cervical  adenopathy.  Skin:    General: Skin is warm and dry.  Neurological:     Mental Status: He is alert and oriented to person, place, and time.     Deep Tendon Reflexes: Reflexes are normal and symmetric.  Psychiatric:        Speech: Speech normal.        Behavior: Behavior normal.        Thought Content: Thought content normal.        Judgment: Judgment normal.     BP (!) 102/56 (BP Location: Right Arm, Patient Position: Sitting, Cuff Size: Large)   Pulse (!) 50   Ht 6' (1.829 m)   Wt 189 lb (85.7 kg)   SpO2 96%   BMI 25.63 kg/m   Assessment and Plan: 1. Annual physical exam Normal exam Continue healthy diet and current medications Immunizations are up to date  2. Coronary artery disease involving native coronary artery of native heart with angina pectoris (HCC) Continue BB, Aspirin, hydralazine and PRN NTG.  Pt reminded to go to the ED if chest pain is not relieved by NTG See cardiology as planned in January  3. Essential (primary) hypertension Running low - will reduce dose of lisinopril to 20 mg daily - Comprehensive metabolic panel - TSH  4.  Anemia in stage 4 chronic kidney disease ([place]) Receiving Epogen from Hematology  5. CKD stage 4 secondary to hypertension (HCC) Stable, labs being done by Nephrology - POCT urinalysis dipstick - persistently positive for RBCs s/p renal biopsy  6. Dyslipidemia On atorvastatin - Lipid panel  7. Elevated PSA Being monitored by Urology - appt in May   Partially dictated using Dragon software. Any errors are unintentional.  Halina Maidens, MD [facility]  06/05/2018

## 2018-06-05 NOTE — ED Triage Notes (Signed)
PT to ED via EMS from PCP office with c/o bradycardia. Pt was being seen today and had episode of weakness and mechanical fall. PT HR in the 30s in route. cbg 146. A&OX4, MD at bedside. Cardiac pads placed on pt at this time

## 2018-06-05 NOTE — ED Notes (Signed)
Cardiology at bedside.

## 2018-06-05 NOTE — Progress Notes (Signed)
Advanced care plan.  Purpose of the Encounter: CODE STATUS  Parties in Attendance: Patient himself  Patient's Decision Capacity: Intact  Subjective/Patient's story:  Patient is a 77 year old with history of coronary artery disease, chronic kidney disease, anemia who is presenting to the hospital with worsening anemia as well as bradycardia  Objective/Medical story Discussed with the patient regarding his desires for cardiac and pulmonary resuscitation as well as future medical treatment   Goals of care determination:  Patient states that he would like to be a full code and would want aggressive treatment   CODE STATUS:  Full code  Time spent discussing advanced care planning: 16 minutes

## 2018-06-05 NOTE — ED Notes (Signed)
Pt given lunch tray at this time

## 2018-06-06 ENCOUNTER — Inpatient Hospital Stay
Admit: 2018-06-06 | Discharge: 2018-06-06 | Disposition: A | Discharge status: Home / Self Care | Payer: Medicare Other | Attending: Internal Medicine | Admitting: Internal Medicine

## 2018-06-06 DIAGNOSIS — I2510 Atherosclerotic heart disease of native coronary artery without angina pectoris: Secondary | ICD-10-CM

## 2018-06-06 DIAGNOSIS — Z951 Presence of aortocoronary bypass graft: Secondary | ICD-10-CM

## 2018-06-06 DIAGNOSIS — N189 Chronic kidney disease, unspecified: Secondary | ICD-10-CM

## 2018-06-06 DIAGNOSIS — R531 Weakness: Secondary | ICD-10-CM

## 2018-06-06 DIAGNOSIS — R5383 Other fatigue: Secondary | ICD-10-CM

## 2018-06-06 DIAGNOSIS — F17211 Nicotine dependence, cigarettes, in remission: Secondary | ICD-10-CM

## 2018-06-06 DIAGNOSIS — R001 Bradycardia, unspecified: Principal | ICD-10-CM

## 2018-06-06 DIAGNOSIS — D631 Anemia in chronic kidney disease: Secondary | ICD-10-CM

## 2018-06-06 LAB — LACTATE DEHYDROGENASE: LDH: 159 U/L (ref 98–192)

## 2018-06-06 LAB — COMPREHENSIVE METABOLIC PANEL
ALT: 20 IU/L (ref 0–44)
AST: 12 IU/L (ref 0–40)
Albumin/Globulin Ratio: 2.7 — ABNORMAL HIGH (ref 1.2–2.2)
Albumin: 3.2 g/dL — ABNORMAL LOW (ref 3.5–4.8)
Alkaline Phosphatase: 73 IU/L (ref 39–117)
BUN/Creatinine Ratio: 18 (ref 10–24)
BUN: 42 mg/dL — ABNORMAL HIGH (ref 8–27)
Bilirubin Total: <0.2 mg/dL (ref 0.0–1.2)
CO2: 16 mmol/L — ABNORMAL LOW (ref 20–29)
Calcium: 8.9 mg/dL (ref 8.6–10.2)
Chloride: 109 mmol/L — ABNORMAL HIGH (ref 96–106)
Creatinine, Ser: 2.4 mg/dL — ABNORMAL HIGH (ref 0.76–1.27)
GFR calc Af Amer: 29 mL/min/{1.73_m2} — ABNORMAL LOW (ref >59)
GFR, EST NON AFRICAN AMERICAN: 25 mL/min/{1.73_m2} — AB (ref >59)
Globulin, Total: 1.2 g/dL — ABNORMAL LOW (ref 1.5–4.5)
Glucose: 115 mg/dL — ABNORMAL HIGH (ref 65–99)
Potassium: 5.2 mmol/L (ref 3.5–5.2)
Sodium: 143 mmol/L (ref 134–144)
Total Protein: 4.4 g/dL — CL (ref 6.0–8.5)

## 2018-06-06 LAB — CBC
HEMATOCRIT: 21.1 % — AB (ref 39.0–52.0)
Hemoglobin: 6.3 g/dL — ABNORMAL LOW (ref 13.0–17.0)
MCH: 29.2 pg (ref 26.0–34.0)
MCHC: 29.9 g/dL — ABNORMAL LOW (ref 30.0–36.0)
MCV: 97.7 fL (ref 80.0–100.0)
Platelets: 258 10*3/uL (ref 150–400)
RBC: 2.16 MIL/uL — ABNORMAL LOW (ref 4.22–5.81)
RDW: 19 % — ABNORMAL HIGH (ref 11.5–15.5)
WBC: 7.2 10*3/uL (ref 4.0–10.5)
nRBC: 0 % (ref 0.0–0.2)

## 2018-06-06 LAB — TSH: TSH: 4.37 u[IU]/mL (ref 0.450–4.500)

## 2018-06-06 LAB — LIPID PANEL
CHOL/HDL RATIO: 3.1 ratio (ref 0.0–5.0)
Cholesterol, Total: 124 mg/dL (ref 100–199)
HDL: 40 mg/dL (ref >39)
LDL Calculated: 52 mg/dL (ref 0–99)
TRIGLYCERIDES: 158 mg/dL — AB (ref 0–149)
VLDL Cholesterol Cal: 32 mg/dL (ref 5–40)

## 2018-06-06 LAB — BASIC METABOLIC PANEL
Anion gap: 6 (ref 5–15)
BUN: 46 mg/dL — ABNORMAL HIGH (ref 8–23)
CO2: 19 mmol/L — ABNORMAL LOW (ref 22–32)
Calcium: 7.8 mg/dL — ABNORMAL LOW (ref 8.9–10.3)
Chloride: 113 mmol/L — ABNORMAL HIGH (ref 98–111)
Creatinine, Ser: 2.56 mg/dL — ABNORMAL HIGH (ref 0.61–1.24)
GFR calc Af Amer: 27 mL/min — ABNORMAL LOW (ref >60)
GFR calc non Af Amer: 23 mL/min — ABNORMAL LOW (ref >60)
Glucose, Bld: 85 mg/dL (ref 70–99)
POTASSIUM: 4.3 mmol/L (ref 3.5–5.1)
Sodium: 138 mmol/L (ref 135–145)

## 2018-06-06 LAB — HEMOGLOBIN AND HEMATOCRIT, BLOOD
HEMATOCRIT: 27.1 % — AB (ref 39.0–52.0)
Hemoglobin: 8.7 g/dL — ABNORMAL LOW (ref 13.0–17.0)

## 2018-06-06 LAB — PATHOLOGIST SMEAR REVIEW

## 2018-06-06 LAB — TROPONIN I: Troponin I: 0.4 ng/mL (ref <0.03)

## 2018-06-06 LAB — PREPARE RBC (CROSSMATCH)

## 2018-06-06 MED ORDER — CYANOCOBALAMIN 1000 MCG/ML IJ SOLN
1000.0000 ug | Freq: Every day | INTRAMUSCULAR | Status: DC
  Administered 2018-06-06: 1000 ug via INTRAMUSCULAR
  Filled 2018-06-06 (×3): qty 1

## 2018-06-06 MED ORDER — MYCOPHENOLATE MOFETIL 250 MG PO CAPS
500.0000 mg | ORAL_CAPSULE | Freq: Four times a day (QID) | ORAL | Status: DC
  Administered 2018-06-06 – 2018-06-07 (×4): 500 mg via ORAL
  Filled 2018-06-06 (×6): qty 2

## 2018-06-06 MED ORDER — SODIUM CHLORIDE 0.9% IV SOLUTION
Freq: Once | INTRAVENOUS | Status: AC
  Administered 2018-06-06: 16:00:00 via INTRAVENOUS

## 2018-06-06 MED ORDER — HYDRALAZINE HCL 25 MG PO TABS
25.0000 mg | ORAL_TABLET | Freq: Three times a day (TID) | ORAL | Status: DC
  Administered 2018-06-06 (×3): 25 mg via ORAL
  Filled 2018-06-06 (×3): qty 1

## 2018-06-06 MED ORDER — SODIUM CHLORIDE 0.9% IV SOLUTION
Freq: Once | INTRAVENOUS | Status: AC
  Administered 2018-06-06: 04:00:00 via INTRAVENOUS

## 2018-06-06 NOTE — Consult Note (Signed)
Hematology/Oncology Consult note [facility] [phone] [phone]  Patient Care Team: Glean Hess, MD as PCP - General (Internal Medicine) Hollice Espy, MD as Consulting Physician (Urology) Anthonette Legato, MD (Nephrology) Earlie Server, MD as Consulting Physician (Hematology and Oncology) Teodoro Spray, MD as Consulting Physician (Cardiology)   Name of the patient: Arthur Bowen  [id number]  1940-10-22   Date of visit: 06/06/18 REASON FOR COSULTATION:  Anemia. History of presenting illness-  77 y.o. male with PMH listed at below who was sent by EMS to ER for evaluation of profound weakness and a fall. When EMS arrived, he was noted to have heart rate of 30s-40s.  Systolic blood pressure was in the 100s. No report of chest pain, shortness of breath, nausea or vomiting. Denies hematochezia, hematuria, hematemesis, epistaxis, black tarry stool.  Reports easy bruising. 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  Last Procrit was in September 2019.  Procrit was recommended to be given monthly, per patient's request, Procrit interval was increased to every 8 weeks. Patient has never had a colonoscopy done and is not interested for obtaining one. On admission patient has a hemoglobin of 6.4, MCV 104.3.  WBC was elevated at 11.2, predominantly neutrophils.  Patient status post 1 unit of PRBC transfusion.  Plan to receive another unit of PRBC. He has had a work-up done which showed stable iron panel with ferritin 247, iron saturation 33, normal folate level. Vitamin B12 level was checked on Oct 24, 2017 which showed level of 6177. Level was repeated on 06/05/2018, B12 level low normal at 311.  Patient reports feeling better after 1 unit of PRBC transfusion.  Denies any chest pain, shortness of breath, nausea or vomiting.  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  Review of  Systems  Constitutional: Positive for fatigue. Negative for appetite change, chills, fever and unexpected weight change.  HENT:   Negative for hearing loss and voice change.   Eyes: Negative for eye problems and icterus.  Respiratory: Negative for chest tightness, cough and shortness of breath.   Cardiovascular: Negative for chest pain and leg swelling.  Gastrointestinal: Negative for abdominal distention and abdominal pain.  Endocrine: Negative for hot flashes.  Genitourinary: Negative for difficulty urinating, dysuria and frequency.   Musculoskeletal: Negative for arthralgias.  Skin: Negative for itching and rash.  Neurological: Negative for light-headedness and numbness.  Hematological: Negative for adenopathy. Bruises/bleeds easily.  Psychiatric/Behavioral: Negative for confusion.    Allergies  Allergen Reactions  . Penicillins Other (See Comments)  . Sulfa Antibiotics Other (See Comments)    Patient Active Problem List   Diagnosis Date Noted  . Bradycardia 06/05/2018  . Anemia in stage 4 chronic kidney disease ([place]) 11/21/2017  . Secondary hyperparathyroidism of renal origin ([place]) 10/15/2017  . CKD stage 4 secondary to hypertension ([place]) 10/09/2017  . Primary osteoarthritis of right knee 01/09/2017  . Elevated PSA 08/14/2015  . Renal mass 08/14/2015  . BPH with obstruction/lower urinary tract symptoms 08/14/2015  . History of hematuria 08/14/2015  . Coronary artery disease involving native coronary artery of native heart with angina pectoris ([place]) 04/06/2015  . Dyslipidemia 04/06/2015  . Essential (primary) hypertension 04/06/2015  . Neuropathy 04/06/2015  . Peripheral vascular disease ([place]) 04/06/2015  . 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 Senile purpura ([place]) 04/06/2015     Past Medical History:  Diagnosis Date  . Acquired phimosis   . BPH (benign prostatic  hypertrophy)   . Chronic kidney disease   . Dyspnea    with exertion  . Elevated PSA   .  Gross hematuria   . Hematuria   . History of hiatal hernia   . Hypertension   . 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 Renal mass, left      Past Surgical History:  Procedure Laterality Date  . Circumscision  09/2015  . CORONARY ARTERY BYPASS GRAFT  2002   x3 @ [facility]  . INGUINAL HERNIA REPAIR Left 2011  . RENAL BIOPSY  2014   neg in w/u renal mass/hematuria    Social History   Socioeconomic History  . Marital status: Divorced    Spouse name: Not on file  . Number of children: 2  . Years of education: Not on file  . Highest education level: Not on file  Occupational History  . Not on file  Social Needs  . Financial resource strain: Patient refused  . Food insecurity:    Worry: Patient refused    Inability: Patient refused  . Transportation needs:    Medical: Patient refused    Non-medical: Patient refused  Tobacco Use  . Smoking status: Former Smoker    Packs/day: 0.50    Years: 50.00    Pack years: 25.00    Types: Cigarettes    Last attempt to quit: 10/10/2017    Years since quitting: 0.6  . Smokeless tobacco: Former Systems developer    Types: Chew  . Tobacco comment: last cig 10/10/17   Substance and Sexual Activity  . Alcohol use: No    Alcohol/week: 0.0 standard drinks  . Drug use: No  . Sexual activity: Not on file  Lifestyle  . Physical activity:    Days per week: Patient refused    Minutes per session: Patient refused  . Stress: Patient refused  Relationships  . Social connections:    Talks on phone: Patient refused    Gets together: Patient refused    Attends religious service: Patient refused    Active member of club or organization: Patient refused    Attends meetings of clubs or organizations: Patient refused    Relationship status: Patient refused  . Intimate partner violence:    Fear of current or ex partner: Patient refused    Emotionally abused: Patient refused    Physically abused: Patient refused    Forced sexual activity: Patient  refused  Other Topics Concern  . Not on file  Social History Narrative   Patient refused all questions- 06/03/2017     Family History  Problem Relation Age of Onset  . Lymphoma Mother   . Hodgkin's lymphoma Mother   . Lung cancer Father   . Heart disease Brother   . Dementia Sister   . Dementia Brother   . Breast cancer Maternal Aunt      Current Facility-Administered Medications:  .  0.9 %  sodium chloride infusion (Manually program via Guardrails IV Fluids), , Intravenous, Once, Mody, Sital, MD .  0.9 %  sodium chloride infusion, , Intravenous, Continuous, Dustin Flock, MD, Last Rate: 75 mL/hr at 06/05/18 1707 .  acetaminophen (TYLENOL) tablet 650 mg, 650 mg, Oral, Q6H PRN **OR** acetaminophen (TYLENOL) suppository 650 mg, 650 mg, Rectal, Q6H PRN, Dustin Flock, MD .  aspirin EC tablet 81 mg, 81 mg, Oral, Daily, Dustin Flock, MD, 81 mg at 06/06/18 0949 .  atorvastatin (LIPITOR) tablet 40 mg, 40 mg, Oral, q1800, Dustin Flock, MD, 40 mg  at 06/05/18 1706 .  calcitRIOL (ROCALTROL) capsule 0.25 mcg, 0.25 mcg, Oral, Daily, Dustin Flock, MD, 0.25 mcg at 06/06/18 0949 .  finasteride (PROSCAR) tablet 5 mg, 5 mg, Oral, q1800, Dustin Flock, MD, 5 mg at 06/05/18 1706 .  fluticasone (FLONASE) 50 MCG/ACT nasal spray 2 spray, 2 spray, Each Nare, Daily, Dustin Flock, MD .  heparin injection 5,000 Units, 5,000 Units, Subcutaneous, Q8H, Dustin Flock, MD, 5,000 Units at 06/05/18 1559 .  hydrALAZINE (APRESOLINE) tablet 25 mg, 25 mg, Oral, TID, Murlean Iba, MD, 25 mg at 06/06/18 1405 .  mycophenolate (CELLCEPT) capsule 500 mg, 500 mg, Oral, Q6H, Singh, Harmeet, MD .  ondansetron (ZOFRAN) tablet 4 mg, 4 mg, Oral, Q6H PRN **OR** ondansetron (ZOFRAN) injection 4 mg, 4 mg, Intravenous, Q6H PRN, Dustin Flock, MD   Physical exam: ECOG  Vitals:   06/06/18 0428 06/06/18 0807 06/06/18 0810 06/06/18 1004  BP: (!) 134/49 (!) 123/103 (!) 168/59 (!) 168/64  Pulse: (!) 49 (!) 50 (!)  51 (!) 54  Resp:      Temp: 98.6 F (37 C) 98.3 F (36.8 C)  98.3 F (36.8 C)  TempSrc: Oral Oral  Oral  SpO2: 96% 100%  98%  Weight:       Physical Exam  Constitutional: He is oriented to person, place, and time. No distress.  HENT:  Head: Normocephalic and atraumatic.  Mouth/Throat: No oropharyngeal exudate.  Eyes: Pupils are equal, round, and reactive to light. EOM are normal. No scleral icterus.  Neck: Normal range of motion. Neck supple.  Cardiovascular: Normal rate and regular rhythm.  No murmur heard. Pulmonary/Chest: Effort normal. No respiratory distress. He has no rales. He exhibits no tenderness.  Abdominal: Soft. He exhibits no distension. There is no abdominal tenderness.  Musculoskeletal: Normal range of motion.        General: No edema.  Neurological: He is alert and oriented to person, place, and time. No cranial nerve deficit.  Skin: Skin is warm and dry. No erythema. There is pallor.  Scattered ecchymosis on bilateral upper extremities.   Psychiatric: Affect normal.        CMP Latest Ref Rng & Units 06/06/2018  Glucose 70 - 99 mg/dL 85  BUN 8 - 23 mg/dL 46(H)  Creatinine 0.61 - 1.24 mg/dL 2.56(H)  Sodium 135 - 145 mmol/L 138  Potassium 3.5 - 5.1 mmol/L 4.3  Chloride 98 - 111 mmol/L 113(H)  CO2 22 - 32 mmol/L 19(L)  Calcium 8.9 - 10.3 mg/dL 7.8(L)  Total Protein 6.5 - 8.1 g/dL -  Total Bilirubin 0.3 - 1.2 mg/dL -  Alkaline Phos 38 - 126 U/L -  AST 15 - 41 U/L -  ALT 0 - 44 U/L -   CBC Latest Ref Rng & Units 06/06/2018  WBC 4.0 - 10.5 K/uL 7.2  Hemoglobin 13.0 - 17.0 g/dL 6.3(L)  Hematocrit 39.0 - 52.0 % 21.1(L)  Platelets 150 - 400 K/uL 258   RADIOGRAPHIC STUDIES: I have personally reviewed the radiological images as listed and agreed with the findings in the report.  Dg Chest 1 View  Result Date: 06/05/2018 CLINICAL DATA:  Weakness, bradycardia EXAM: CHEST  1 VIEW COMPARISON:  10/09/2017 FINDINGS: Prior CABG. Cardiomegaly. Mild vascular  congestion and hyperinflation. No confluent opacities or effusions. IMPRESSION: Mild cardiomegaly and vascular congestion. Mild hyperinflation. No acute findings. Electronically Signed   By: Rolm Baptise M.D.   On: 06/05/2018 10:41    Assessment and plan- Patient is a 77 y.o. male with past  medical history of CAD status post CABG, peripheral vascular disease, CKD secondary to FSGS, chronic anemia due to CKD and CellCept presented to the hospital for evaluation of profound weakness and fatigue.  #Symptomatic anemia, status post 1 PRBC blood transfusion yesterday.  Hemoglobin did not improve.  6.3 today.  Agree with another unit of PRBC blood transfusion, check posttransfusion hemoglobin. Anemia is multifactorial, CKD/CellCept induced bone marrow suppression.  Low normal vitamin B12.  Check MMA.  Start empiric vitamin B12 injections 1000 MCG x2. Normal iron panel.  Normal folate level. #Symptomatic bradycardia, improved heart rates after discontinue beta-blocker.  Cardiology on board.  Patient can follow-up outpatient with me after discharge for Procrit..    Thank you for allowing me to participate in the care of this patient.  Total face to face encounter time for this patient visit was 50 min. >50% of the time was  spent in counseling and coordination of care.    Earlie Server, MD, PhD Hematology Oncology [facility] Pager- [phone] 06/06/2018

## 2018-06-06 NOTE — Progress Notes (Signed)
[place] [facility] NAME: Arthur Bowen    MR#:  [id number]  DATE OF BIRTH:  01/18/1941  SUBJECTIVE:   Heart rate improved.  Family is at bedside.  No chest pain shortness of breath.  REVIEW OF SYSTEMS:    Review of Systems  Constitutional: Negative for fever, chills weight loss HENT: Negative for ear pain, nosebleeds, congestion, facial swelling, rhinorrhea, neck pain, neck stiffness and ear discharge.   Respiratory: Negative for cough, shortness of breath, wheezing  Cardiovascular: Negative for chest pain, palpitations and leg swelling.  Gastrointestinal: Negative for heartburn, abdominal pain, vomiting, diarrhea or consitpation Genitourinary: Negative for dysuria, urgency, frequency, hematuria Musculoskeletal: Negative for back pain or joint pain Neurological: Negative for dizziness, seizures, syncope, focal weakness,  numbness and headaches.  Hematological: Does not bruise/bleed easily.  Psychiatric/Behavioral: Negative for hallucinations, confusion, dysphoric mood    Tolerating Diet: yes      DRUG ALLERGIES:   Allergies  Allergen Reactions  . Penicillins Other (See Comments)  . Sulfa Antibiotics Other (See Comments)    VITALS:  Blood pressure (!) 168/64, pulse (!) 54, temperature 98.3 F (36.8 C), temperature source Oral, resp. rate 17, weight 84.9 kg, SpO2 98 %.  PHYSICAL EXAMINATION:  Constitutional: Appears well-developed and well-nourished. No distress. HENT: Normocephalic. Marland Kitchen Oropharynx is clear and moist.  Eyes: Conjunctivae and EOM are normal. PERRLA, no scleral icterus.  Neck: Normal ROM. Neck supple. No JVD. No tracheal deviation. CVS: RRR, S1/S2 +, no murmurs, no gallops, no carotid bruit.  Pulmonary: Effort and breath sounds normal, no stridor, rhonchi, wheezes, rales.  Abdominal: Soft. BS +,  no distension, tenderness, rebound or guarding.  Musculoskeletal: Normal range of motion. No edema and no tenderness.   Neuro: Alert. CN 2-12 grossly intact. No focal deficits. Skin: Skin is warm and dry. No rash noted. Psychiatric: Normal mood and affect.      LABORATORY PANEL:   CBC Recent Labs  Lab 06/06/18 0208  WBC 7.2  HGB 6.3*  HCT 21.1*  PLT 258   ------------------------------------------------------------------------------------------------------------------  Chemistries  Recent Labs  Lab 06/05/18 1022 06/06/18 0208  NA 139 138  K 4.3 4.3  CL 112* 113*  CO2 18* 19*  GLUCOSE 106* 85  BUN 46* 46*  CREATININE 2.49* 2.56*  CALCIUM 8.5* 7.8*  AST 20  --   ALT 20  --   ALKPHOS 64  --   BILITOT 0.3  --    ------------------------------------------------------------------------------------------------------------------  Cardiac Enzymes Recent Labs  Lab 06/05/18 1434 06/05/18 2009 06/06/18 0208  TROPONINI 0.05* 0.16* 0.40*   ------------------------------------------------------------------------------------------------------------------  RADIOLOGY:  Dg Chest 1 View  Result Date: 06/05/2018 CLINICAL DATA:  Weakness, bradycardia EXAM: CHEST  1 VIEW COMPARISON:  10/09/2017 FINDINGS: Prior CABG. Cardiomegaly. Mild vascular congestion and hyperinflation. No confluent opacities or effusions. IMPRESSION: Mild cardiomegaly and vascular congestion. Mild hyperinflation. No acute findings. Electronically Signed   By: Rolm Baptise M.D.   On: 06/05/2018 10:41     ASSESSMENT AND PLAN:   77 year old male with history of CAD status post CABG, peripheral vascular disease, chronic kidney disease stage III, chronic anemia from CellCept with history of FSGS who presented to the hospital with symptomatic bradycardia.  1.  Symptomatic bradycardia: Patient with improved heart rates after discontinuing metoprolol. Follow-up on echocardiogram Continue telemetry monitoring Appreciate cardiology consultation  2.  Acute on chronic kidney disease stage III with FSGS: Acute kidney injury in  the setting of hypotension/bradycardia and anemia. Discussed with nephrology  this morning.  Avoid nephrotoxic medications at this time Continue CellCept  3.  Acute on chronic anemia: Patient is followed up by oncology. Oncology consulted via epic Patient is status post 1 unit PRBC and will receive another unit today.   4.  CAD/CABG: Continue aspirin, atorvastatin No longer on beta-blocker due to problem #1 5.  Essential hypertension: Continue hydralazine  6.  BPH: Continue finasteride Management plans discussed with the patient and he is in agreement.  CODE STATUS: full  TOTAL TIME TAKING CARE OF THIS PATIENT: 30 minutes.     POSSIBLE D/C tomorrow, DEPENDING ON CLINICAL CONDITION.   Arthur Bowen M.D on 06/06/2018 at 12:13 PM  Between 7am to 6pm - Pager - [phone] After 6pm go to [email or web address] - password [email or web address] [facility]  Office  [phone]  CC: Primary care physician; Glean Hess, MD  Note: This dictation was prepared with Dragon dictation along with smaller phrase technology. Any transcriptional errors that result from this process are unintentional.

## 2018-06-06 NOTE — Progress Notes (Signed)
[place], [place] 06/06/18  Subjective:   Patient known to our practice from outpatient. He is followed by Dr. Holley Raring for FSGS which is being treated with CellCept 1000 mg twice a day.  On December 19 he presented to the office for follow-up and was found to be severely bradycardic therefore referred to have bradycardia which was symptomatic therefore he was referred to emergency room for further evaluation.  He was also found to have severe anemia and he is getting blood transfusion for him. Most recent outpatient labs are from September 2016 creatinine was 2.06, GFR 30 Creatinine at admission was 2.40, which has slightly increased to 2.56 this morning Patient reports diarrhea from taking CellCept He received blood transfusion this admission.  He was able to walk to the bathroom without symptoms  Objective:  Vital signs in last 24 hours:  Temp:  [97.6 F (36.4 C)-98.8 F (37.1 C)] 98.3 F (36.8 C) (12/20 1004) Pulse Rate:  [36-97] 54 (12/20 1004) Resp:  [14-20] 17 (12/20 0403) BP: (109-168)/(48-103) 168/64 (12/20 1004) SpO2:  [94 %-100 %] 98 % (12/20 1004) Weight:  [84.9 kg] 84.9 kg (12/20 0403)  Weight change:  Filed Weights   06/06/18 0403  Weight: 84.9 kg    Intake/Output:    Intake/Output Summary (Last 24 hours) at 06/06/2018 1015 Last data filed at 06/06/2018 0907 Gross per 24 hour  Intake 2298.67 ml  Output 200 ml  Net 2098.67 ml     Physical Exam: General:  No acute distress, laying in the bed  HEENT  anicteric, moist oral mucous membranes, no thrush  Neck  supple  Pulm/lungs  normal breathing effort, room air, clear to auscultation  CVS/Heart  soft systolic murmur, no rub  Abdomen:   Soft, nontender  Extremities:  No peripheral edema  Neurologic:  Alert, oriented  Skin:  No acute rashes    Basic Metabolic Panel:  Recent Labs  Lab 06/05/18 0837 06/05/18 1022 06/06/18 0208  NA 143 139 138  K 5.2 4.3 4.3  CL 109* 112*  113*  CO2 16* 18* 19*  GLUCOSE 115* 106* 85  BUN 42* 46* 46*  CREATININE 2.40* 2.49* 2.56*  CALCIUM 8.9 8.5* 7.8*     CBC: Recent Labs  Lab 06/05/18 1022 06/06/18 0208  WBC 11.2* 7.2  NEUTROABS 8.5*  --   HGB 6.4* 6.3*  HCT 21.9* 21.1*  MCV 104.3* 97.7  PLT 369 258     No results found for: HEPBSAG, HEPBSAB, HEPBIGM    Microbiology:  No results found for this or any previous visit (from the past 240 hour(s)).  Coagulation Studies: No results for input(s): LABPROT, INR in the last 72 hours.  Urinalysis: Recent Labs    06/05/18 1157  BILIRUBINUR neg  PROTEINUR Positive*  UROBILINOGEN 0.2  NITRITE neg  LEUKOCYTESUR Negative      Imaging: Dg Chest 1 View  Result Date: 06/05/2018 CLINICAL DATA:  Weakness, bradycardia EXAM: CHEST  1 VIEW COMPARISON:  10/09/2017 FINDINGS: Prior CABG. Cardiomegaly. Mild vascular congestion and hyperinflation. No confluent opacities or effusions. IMPRESSION: Mild cardiomegaly and vascular congestion. Mild hyperinflation. No acute findings. Electronically Signed   By: Rolm Baptise M.D.   On: 06/05/2018 10:41     Medications:   . sodium chloride 75 mL/hr at 06/05/18 1707   . sodium chloride   Intravenous Once  . aspirin EC  81 mg Oral Daily  . atorvastatin  40 mg Oral q1800  . calcitRIOL  0.25 mcg Oral  Daily  . finasteride  5 mg Oral q1800  . fluticasone  2 spray Each Nare Daily  . heparin  5,000 Units Subcutaneous Q8H  . mycophenolate  1,000 mg Oral BID   acetaminophen **OR** acetaminophen, ondansetron **OR** ondansetron (ZOFRAN) IV  Assessment/ Plan:  77 y.o. 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  Acute kidney injury 2.  Chronic kidney disease stage 3.  Baseline creatinine 2.06/GFR 30 from September 2019 3.   FSGS with nephrotic proteinuria 4.  Symptomatic bradycardia 5.  Hypertension with CKD 6.  Anemia, unspec. Hematology evaluation pending  Heart rate has now improved in the low 50s after metoprolol was stopped.  His blood pressure is elevated at 168/64.  Acute kidney injury is likely secondary to relative hypotension/hemodynamic changes from severe bradycardia. BP on admission was 102/56 Plan: Avoid ACE inhibitor at this time.  Can be started as outpatient Consider hydralazine low-dose for blood pressure control Split the dose of mycophenolate to 4 times a day to see if it helps with diarrhea Follow-up lab results tomorrow.   LOS: [place] 12/20/201910:15 AM  [place], [place]  Note: This note was prepared with Dragon dictation. Any transcription errors are unintentional

## 2018-06-06 NOTE — Progress Notes (Signed)
*  PRELIMINARY RESULTS* Echocardiogram 2D Echocardiogram has been performed.  Arthur Bowen 06/06/2018, 2:30 PM

## 2018-06-06 NOTE — Progress Notes (Signed)
[facility] Encounter Note  Patient: Arthur Bowen / Admit Date: 06/05/2018 / Date of Encounter: 06/06/2018, 8:35 AM   Subjective: Patient feels much better at this time without evidence of chest discomfort shortness of breath or congestive heart failure type symptoms.  Telemetry shows heart rate improved after discontinuation of metoprolol now into the 50 to 60 bpm range.  Patient does have elevated troponin of 0.4 consistent with demand ischemia due to bradycardia and severe anemia.  Patient tolerating additional packed red blood cells at this time without heart failure  Review of Systems: Positive for: Shortness of breath Negative for: Vision change, hearing change, syncope, dizziness, nausea, vomiting,diarrhea, bloody stool, stomach pain, cough, congestion, diaphoresis, urinary frequency, urinary pain,skin lesions, skin rashes Others previously listed  Objective: Telemetry: Sinus bradycardia Physical Exam: Blood pressure (!) 168/59, pulse (!) 51, temperature 98.3 F (36.8 C), temperature source Oral, resp. rate 17, weight 84.9 kg, SpO2 100 %. Body mass index is 25.38 kg/m. General: Well developed, well nourished, in no acute distress. Head: Normocephalic, atraumatic, sclera non-icteric, no xanthomas, nares are without discharge. Neck: No apparent masses Lungs: Normal respirations with no wheezes, no rhonchi, no rales , few crackles   Heart: Regular rate and rhythm, normal S1 S2, no murmur, no rub, no gallop, PMI is normal size and placement, carotid upstroke normal without bruit, jugular venous pressure normal Abdomen: Soft, non-tender, non-distended with normoactive bowel sounds. No hepatosplenomegaly. Abdominal aorta is normal size without bruit Extremities: Trace edema, no clubbing, no cyanosis, no ulcers,  Peripheral: 2+ radial, 2+ femoral, 2+ dorsal pedal pulses Neuro: Alert and oriented. Moves all extremities spontaneously. Psych:  Responds to questions  appropriately with a normal affect.   Intake/Output Summary (Last 24 hours) at 06/06/2018 0835 Last data filed at 06/06/2018 0700 Gross per 24 hour  Intake 2038.25 ml  Output 200 ml  Net 1838.25 ml    Inpatient Medications:  . sodium chloride   Intravenous Once  . aspirin EC  81 mg Oral Daily  . atorvastatin  40 mg Oral q1800  . calcitRIOL  0.25 mcg Oral Daily  . finasteride  5 mg Oral q1800  . fluticasone  2 spray Each Nare Daily  . heparin  5,000 Units Subcutaneous Q8H  . mycophenolate  1,000 mg Oral BID   Infusions:  . sodium chloride 75 mL/hr at 06/05/18 1707    Labs: Recent Labs    06/05/18 1022 06/06/18 0208  NA 139 138  K 4.3 4.3  CL 112* 113*  CO2 18* 19*  GLUCOSE 106* 85  BUN 46* 46*  CREATININE 2.49* 2.56*  CALCIUM 8.5* 7.8*   Recent Labs    06/05/18 0837 06/05/18 1022  AST 12 20  ALT 20 20  ALKPHOS 73 64  BILITOT <0.2 0.3  PROT 4.4* 5.2*  ALBUMIN 3.2* 3.1*   Recent Labs    06/05/18 1022 06/06/18 0208  WBC 11.2* 7.2  NEUTROABS 8.5*  --   HGB 6.4* 6.3*  HCT 21.9* 21.1*  MCV 104.3* 97.7  PLT 369 258   Recent Labs    06/05/18 1022 06/05/18 1434 06/05/18 2009 06/06/18 0208  TROPONINI 0.04* 0.05* 0.16* 0.40*   Invalid input(s): POCBNP No results for input(s): HGBA1C in the last 72 hours.   Weights: Filed Weights   06/06/18 0403  Weight: 84.9 kg     Radiology/Studies:  Dg Chest 1 View  Result Date: 06/05/2018 CLINICAL DATA:  Weakness, bradycardia EXAM: CHEST  1 VIEW COMPARISON:  10/09/2017  FINDINGS: Prior CABG. Cardiomegaly. Mild vascular congestion and hyperinflation. No confluent opacities or effusions. IMPRESSION: Mild cardiomegaly and vascular congestion. Mild hyperinflation. No acute findings. Electronically Signed   By: Rolm Baptise M.D.   On: 06/05/2018 10:41     Assessment and Recommendation  77 y.o. male with known coronary disease status post coronary to bypass graft chronic kidney disease stage IV essential  hypertension mixed hyperlipidemia with a severe anemia causing diastolic dysfunction heart failure and metoprolol causing severe bradycardia slightly improved at this time with non-ST elevation myocardial infarction from demand ischemia 1.  Continue packed red blood cells for severe anemia for a goal hemoglobin above 8 2.  Echocardiogram for LV systolic dysfunction valvular heart disease due to congestive heart failure and non-ST elevation myocardial infarction 3.  Abstain from beta-blocker due to bradycardia 4.  High intensity cholesterol therapy with atorvastatin 5.  Begin ambulation follow-up for improvements of symptoms and need for adjustments of medication management  Signed, Serafina Royals M.D. FACC

## 2018-06-06 NOTE — Progress Notes (Signed)
Talked to Dr. Marcille Blanco about patient's scheduled heparin subq for VTE prevention. Patient's hemoglobin is 6.3 and received 1 unit PRBC, order for heparin subQ was discontinued and will place patient on SCD. Patient no active bleeding noted. RN will continue to monitor.

## 2018-06-07 LAB — CBC
HCT: 26.8 % — ABNORMAL LOW (ref 39.0–52.0)
Hemoglobin: 8.5 g/dL — ABNORMAL LOW (ref 13.0–17.0)
MCH: 29.7 pg (ref 26.0–34.0)
MCHC: 31.7 g/dL (ref 30.0–36.0)
MCV: 93.7 fL (ref 80.0–100.0)
Platelets: 236 10*3/uL (ref 150–400)
RBC: 2.86 MIL/uL — ABNORMAL LOW (ref 4.22–5.81)
RDW: 18.1 % — ABNORMAL HIGH (ref 11.5–15.5)
WBC: 5.9 10*3/uL (ref 4.0–10.5)
nRBC: 0 % (ref 0.0–0.2)

## 2018-06-07 LAB — ECHOCARDIOGRAM COMPLETE
Height: 72 in
Weight: 2993.6 oz

## 2018-06-07 LAB — TYPE AND SCREEN
ABO/RH(D): O POS
Antibody Screen: NEGATIVE
Unit division: 0
Unit division: 0
Unit division: 0

## 2018-06-07 LAB — BPAM RBC
Blood Product Expiration Date: 202001112359
Blood Product Expiration Date: 202001112359
Blood Product Expiration Date: 202001112359
ISSUE DATE / TIME: 201912191451
ISSUE DATE / TIME: 201912200408
ISSUE DATE / TIME: 201912201605
UNIT TYPE AND RH: 5100
Unit Type and Rh: 5100
Unit Type and Rh: 5100

## 2018-06-07 LAB — BASIC METABOLIC PANEL
Anion gap: 5 (ref 5–15)
BUN: 38 mg/dL — ABNORMAL HIGH (ref 8–23)
CO2: 19 mmol/L — ABNORMAL LOW (ref 22–32)
Calcium: 8 mg/dL — ABNORMAL LOW (ref 8.9–10.3)
Chloride: 116 mmol/L — ABNORMAL HIGH (ref 98–111)
Creatinine, Ser: 2.15 mg/dL — ABNORMAL HIGH (ref 0.61–1.24)
GFR calc Af Amer: 33 mL/min — ABNORMAL LOW (ref >60)
GFR calc non Af Amer: 28 mL/min — ABNORMAL LOW (ref >60)
Glucose, Bld: 99 mg/dL (ref 70–99)
Potassium: 3.9 mmol/L (ref 3.5–5.1)
Sodium: 140 mmol/L (ref 135–145)

## 2018-06-07 LAB — OCCULT BLOOD X 1 CARD TO LAB, STOOL: Fecal Occult Bld: NEGATIVE

## 2018-06-07 LAB — TROPONIN I: Troponin I: 1.02 ng/mL (ref <0.03)

## 2018-06-07 LAB — HAPTOGLOBIN: Haptoglobin: 167 mg/dL (ref 34–355)

## 2018-06-07 MED ORDER — HYDRALAZINE HCL 50 MG PO TABS
50.0000 mg | ORAL_TABLET | Freq: Three times a day (TID) | ORAL | Status: DC
  Administered 2018-06-07: 50 mg via ORAL
  Filled 2018-06-07: qty 1

## 2018-06-07 MED ORDER — HYDRALAZINE HCL 25 MG PO TABS: 50.0000 mg | ORAL_TABLET | Freq: Three times a day (TID) | ORAL | 0 refills | Status: DC

## 2018-06-07 NOTE — Discharge Summary (Signed)
[facility] NAME: Arthur Bowen    MR#:  [id number]  DATE OF BIRTH:  02-Mar-1941  DATE OF ADMISSION:  06/05/2018 ADMITTING PHYSICIAN: Dustin Flock, MD  DATE OF DISCHARGE: No discharge date for patient encounter.  PRIMARY CARE PHYSICIAN: Glean Hess, MD    ADMISSION DIAGNOSIS:  Symptomatic bradycardia [R00.1] Symptomatic anemia [D64.9] Gastrointestinal hemorrhage, unspecified gastrointestinal hemorrhage type [K92.2] Acute renal failure superimposed on chronic kidney disease, unspecified CKD stage, unspecified acute renal failure type ([place]) [N17.9, N18.9]  DISCHARGE DIAGNOSIS:  Active Problems:   Symptomatic anemia   Bradycardia   Chronic kidney disease   SECONDARY DIAGNOSIS:   Past Medical History:  Diagnosis Date  . Acquired phimosis   . BPH (benign prostatic hypertrophy)   . Chronic kidney disease   . Dyspnea    with exertion  . Elevated PSA   . Gross hematuria   . Hematuria   . History of hiatal hernia   . Hypertension   . 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  1.  Symptomatic bradycardia Secondary to beta-blocker therapy which was discontinued [facility] did see patient while in house, echocardiogram was normal and discussion with cardiology-okay for discharge  2.  Acute on chronic kidney disease stage III with FSGS [facility] did see patient while in house-okay for discharge Continued CellCept, ACE inhibitor discontinued-can be restarted on outpatient basis per nephrology  3.  Acute on chronic anemia Improved status post transfusion Oncology did see patient while in house  4.   CAD/CABG Stable Continue aspirin, atorvastatin No longer on beta-blocker due to problem #1  5.  Essential hypertension Stable Continue current regiment  6.  BPH Stable Continue finasteride  DISCHARGE CONDITIONS:   stable  CONSULTS OBTAINED:  Treatment Team:  Murlean Iba, MD  DRUG ALLERGIES:   Allergies  Allergen Reactions  . Penicillins Other (See Comments)  . Sulfa Antibiotics Other (See Comments)    DISCHARGE MEDICATIONS:   Allergies as of 06/07/2018      Reactions   Penicillins Other (See Comments)   Sulfa Antibiotics Other (See Comments)      Medication List    STOP taking these medications   metoprolol tartrate 50 MG tablet Commonly known as:  LOPRESSOR     TAKE these medications   amLODipine 5 MG tablet Commonly known as:  NORVASC Take 1 tablet (5 mg total) by mouth daily.   aspirin 81 MG tablet Take 1 tablet by mouth daily. Reported on 09/02/2015   atorvastatin 40 MG tablet Commonly known as:  LIPITOR TAKE 1 TABLET BY MOUTH ONCE DAILY   calcitRIOL 0.25 MCG capsule Commonly known as:  ROCALTROL Take 0.25 mcg by mouth daily.   finasteride 5 MG tablet Commonly known as:  PROSCAR TAKE 1 TABLET BY MOUTH ONCE DAILY   fluticasone 50 MCG/ACT nasal spray Commonly known as:  FLONASE Place 2 sprays into both nostrils daily.   hydrALAZINE 25 MG tablet Commonly known as:  APRESOLINE Take 2 tablets (50 mg total) by mouth 3 (three) times daily. What changed:  medication strength   lisinopril 20 MG tablet Commonly known as:  PRINIVIL,ZESTRIL Take 1 tablet (20 mg total) by mouth daily.  mycophenolate 500 MG tablet Commonly known as:  CELLCEPT Take 1,000 mg by mouth 2 (two) times daily.   nitroGLYCERIN 0.4 MG SL tablet Commonly known as:  NITROSTAT Place 1 tablet (0.4 mg total) under the tongue every 5 (five) minutes as needed for chest pain.   VITAMIN B 12 PO Take by mouth daily.        DISCHARGE INSTRUCTIONS:    If you  experience worsening of your admission symptoms, develop shortness of breath, life threatening emergency, suicidal or homicidal thoughts you must seek medical attention immediately by calling 911 or calling your MD immediately  if symptoms less severe.  You Must read complete instructions/literature along with all the possible adverse reactions/side effects for all the Medicines you take and that have been prescribed to you. Take any new Medicines after you have completely understood and accept all the possible adverse reactions/side effects.   Please note  You were cared for by a hospitalist during your hospital stay. If you have any questions about your discharge medications or the care you received while you were in the hospital after you are discharged, you can call the unit and asked to speak with the hospitalist on call if the hospitalist that took care of you is not available. Once you are discharged, your primary care physician will handle any further medical issues. Please note that NO REFILLS for any discharge medications will be authorized once you are discharged, as it is imperative that you return to your primary care physician (or establish a relationship with a primary care physician if you do not have one) for your aftercare needs so that they can reassess your need for medications and monitor your lab values.    Today   CHIEF COMPLAINT:   Chief Complaint  Patient presents with  . Bradycardia    HISTORY OF PRESENT ILLNESS:   77 y.o. male with a known history of history of MI status post CABG as well as chronic kidney disease who is presenting to the emergency room with complaint of weakness and fall.  Patient states that he went to his nephrologist this morning when he got out of the car he felt very weak and had to sit down.  Patient then called EMS when he arrived here he was noted to have heart rate in the 30s and 40s.  Blood pressure was in the 100s.  Patient otherwise denies  any chest pain shortness of breath no nausea vomiting or diarrhea.  He does take metoprolol at home. Patient also has a history of anemia which is chronic but usually in the 8 range now noticed to be low at 6.4.  Patient denies any blood in the stool  VITAL SIGNS:  Blood pressure (!) 158/76, pulse 72, temperature 98.4 F (36.9 C), temperature source Oral, resp. rate 18, weight 84.9 kg, SpO2 99 %.  I/O:    Intake/Output Summary (Last 24 hours) at 06/07/2018 1105 Last data filed at 06/07/2018 0708 Gross per 24 hour  Intake 695.7 ml  Output 1400 ml  Net -704.3 ml    PHYSICAL EXAMINATION:  GENERAL:  77 y.o.-year-old patient lying in the bed with no acute distress.  EYES: Pupils equal, round, reactive to light and accommodation. No scleral icterus. Extraocular muscles intact.  HEENT: Head atraumatic, normocephalic. Oropharynx and nasopharynx clear.  NECK:  Supple, no jugular venous distention. No thyroid enlargement, no tenderness.  LUNGS: Normal breath sounds bilaterally, no wheezing, rales,rhonchi or crepitation. No use of accessory muscles of respiration.  CARDIOVASCULAR: S1, S2 normal. No murmurs, rubs, or gallops.  ABDOMEN: Soft, non-tender, non-distended. Bowel sounds present. No organomegaly or mass.  EXTREMITIES: No pedal edema, cyanosis, or clubbing.  NEUROLOGIC: Cranial nerves II through XII are intact. Muscle strength 5/5 in all extremities. Sensation intact. Gait not checked.  PSYCHIATRIC: The patient is alert and oriented x 3.  SKIN: No obvious rash, lesion, or ulcer.   DATA REVIEW:   CBC Recent Labs  Lab 06/07/18 0409  WBC 5.9  HGB 8.5*  HCT 26.8*  PLT 236    Chemistries  Recent Labs  Lab 06/05/18 1022  06/07/18 0409  NA 139   < > 140  K 4.3   < > 3.9  CL 112*   < > 116*  CO2 18*   < > 19*  GLUCOSE 106*   < > 99  BUN 46*   < > 38*  CREATININE 2.49*   < > 2.15*  CALCIUM 8.5*   < > 8.0*  AST 20  --   --   ALT 20  --   --   ALKPHOS 64  --   --    BILITOT 0.3  --   --    < > = values in this interval not displayed.    Cardiac Enzymes Recent Labs  Lab 06/07/18 1000  TROPONINI 1.02*    Microbiology Results  Results for orders placed or performed in visit on 04/19/16  CULTURE, URINE COMPREHENSIVE     Status: Abnormal   Collection Time: 04/19/16 10:03 AM  Result Value Ref Range Status   Urine Culture, Comprehensive Final report (A)  Final   Organism ID, Bacteria Staphylococcus aureus (A)  Final    Comment: Greater than 100,000 colony forming units per mL Based on susceptibility to oxacillin this isolate would be susceptible to: * Beta-lactam/beta-lactamase inhibitor combinations; such as:     Amoxicillin-clavulanic acid     Ampicillin-sulbactam * Antistaphylococcal cephems; such as:     Cefaclor     Cefuroxime * Antistaphylococcal carbapenems; such as:     Imipenem     Meropenem Most isolates of Staphylococcus sp. produce a beta-lactamase enzyme rendering them resistant to penicillin. Please contact the laboratory if penicillin is being considered for therapy.    ANTIMICROBIAL SUSCEPTIBILITY Comment  Final    Comment:       ** S = Susceptible; I = Intermediate; R = Resistant **                    P = Positive; N = Negative             MICS are expressed in micrograms per mL    Antibiotic                 RSLT#1    RSLT#2    RSLT#3    RSLT#4 Ciprofloxacin                  S Gentamicin                     S Levofloxacin                   S Linezolid                      S Moxifloxacin                   S Nitrofurantoin  S Oxacillin                      S Quinupristin/Dalfopristin      S Rifampin                       S Tetracycline                   S Trimethoprim/Sulfa             S Vancomycin                     S   Microscopic Examination     Status: Abnormal   Collection Time: 04/19/16 10:03 AM  Result Value Ref Range Status   WBC, UA None seen 0 - 5 /hpf Final   RBC, UA >30 (A) 0 - 2 /hpf  Final   Epithelial Cells (non renal) None seen 0 - 10 /hpf Final   Bacteria, UA None seen None seen/Few Final    RADIOLOGY:  No results found.  EKG:   Orders placed or performed during the hospital encounter of 06/05/18  . EKG 12-Lead  . EKG 12-Lead      Management plans discussed with the patient, family and they are in agreement.  CODE STATUS:     Code Status Orders  (From admission, onward)         Start     Ordered   06/05/18 1420  Full code  Continuous     06/05/18 1419        Code Status History    Date Active Date Inactive Code Status Order ID Comments User Context   07/31/2017 1018 08/01/2017 1319 Full Code [id number]  Vaughan Basta, MD Inpatient      TOTAL TIME TAKING CARE OF THIS PATIENT: 40 minutes.    Avel Peace Alyson Ki M.D on 06/07/2018 at 11:05 AM  Between 7am to 6pm - Pager - [phone]  After 6pm go to [email or web address] - password [email or web address] [facility]  Office  [phone]  CC: Primary care physician; Glean Hess, MD   Note: This dictation was prepared with Dragon dictation along with smaller phrase technology. Any transcriptional errors that result from this process are unintentional.

## 2018-06-07 NOTE — Progress Notes (Signed)
RN removed IV.  Patient left in a private vehicle with his family.  Phillis Knack, RN

## 2018-06-07 NOTE — Progress Notes (Signed)
[facility] Encounter Note  Patient: Arthur Bowen / Admit Date: 06/05/2018 / Date of Encounter: 06/07/2018, 10:25 AM   Subjective: Patient feels much better at this time without evidence of chest discomfort shortness of breath or congestive heart failure type symptoms.  Telemetry shows heart rate improved after discontinuation of metoprolol now into the 50 to 60 bpm range.  Patient does have elevated troponin of 0.4 consistent with demand ischemia due to bradycardia and severe anemia.  Patient tolerated addition of packed red blood cells and now feels much better Cardiogram showing normal LV systolic function with ejection fraction of 55%  Review of Systems: Positive for: Shortness of breath Negative for: Vision change, hearing change, syncope, dizziness, nausea, vomiting,diarrhea, bloody stool, stomach pain, cough, congestion, diaphoresis, urinary frequency, urinary pain,skin lesions, skin rashes Others previously listed  Objective: Telemetry: Sinus bradycardia Physical Exam: Blood pressure (!) 158/76, pulse 72, temperature 98.4 F (36.9 C), temperature source Oral, resp. rate 18, weight 84.9 kg, SpO2 99 %. Body mass index is 25.38 kg/m. General: Well developed, well nourished, in no acute distress. Head: Normocephalic, atraumatic, sclera non-icteric, no xanthomas, nares are without discharge. Neck: No apparent masses Lungs: Normal respirations with no wheezes, no rhonchi, no rales , few crackles   Heart: Regular rate and rhythm, normal S1 S2, no murmur, no rub, no gallop, PMI is normal size and placement, carotid upstroke normal without bruit, jugular venous pressure normal Abdomen: Soft, non-tender, non-distended with normoactive bowel sounds. No hepatosplenomegaly. Abdominal aorta is normal size without bruit Extremities: Trace edema, no clubbing, no cyanosis, no ulcers,  Peripheral: 2+ radial, 2+ femoral, 2+ dorsal pedal pulses Neuro: Alert and oriented. Moves  all extremities spontaneously. Psych:  Responds to questions appropriately with a normal affect.   Intake/Output Summary (Last 24 hours) at 06/07/2018 1025 Last data filed at 06/07/2018 0708 Gross per 24 hour  Intake 695.7 ml  Output 1400 ml  Net -704.3 ml    Inpatient Medications:  . aspirin EC  81 mg Oral Daily  . atorvastatin  40 mg Oral q1800  . calcitRIOL  0.25 mcg Oral Daily  . cyanocobalamin  1,000 mcg Intramuscular Daily  . finasteride  5 mg Oral q1800  . fluticasone  2 spray Each Nare Daily  . hydrALAZINE  50 mg Oral TID  . mycophenolate  500 mg Oral Q6H   Infusions:  . sodium chloride Stopped (06/06/18 1831)    Labs: Recent Labs    06/06/18 0208 06/07/18 0409  NA 138 140  K 4.3 3.9  CL 113* 116*  CO2 19* 19*  GLUCOSE 85 99  BUN 46* 38*  CREATININE 2.56* 2.15*  CALCIUM 7.8* 8.0*   Recent Labs    06/05/18 0837 06/05/18 1022  AST 12 20  ALT 20 20  ALKPHOS 73 64  BILITOT <0.2 0.3  PROT 4.4* 5.2*  ALBUMIN 3.2* 3.1*   Recent Labs    06/05/18 1022 06/06/18 0208 06/06/18 1952 06/07/18 0409  WBC 11.2* 7.2  --  5.9  NEUTROABS 8.5*  --   --   --   HGB 6.4* 6.3* 8.7* 8.5*  HCT 21.9* 21.1* 27.1* 26.8*  MCV 104.3* 97.7  --  93.7  PLT 369 258  --  236   Recent Labs    06/05/18 1022 06/05/18 1434 06/05/18 2009 06/06/18 0208  TROPONINI 0.04* 0.05* 0.16* 0.40*   Invalid input(s): POCBNP No results for input(s): HGBA1C in the last 72 hours.   Weights: [employer]  06/06/18 0403  Weight: 84.9 kg     Radiology/Studies:  Dg Chest 1 View  Result Date: 06/05/2018 CLINICAL DATA:  Weakness, bradycardia EXAM: CHEST  1 VIEW COMPARISON:  10/09/2017 FINDINGS: Prior CABG. Cardiomegaly. Mild vascular congestion and hyperinflation. No confluent opacities or effusions. IMPRESSION: Mild cardiomegaly and vascular congestion. Mild hyperinflation. No acute findings. Electronically Signed   By: Rolm Baptise M.D.   On: 06/05/2018 10:41     Assessment and  Recommendation  77 y.o. male with known coronary disease status post coronary to bypass graft chronic kidney disease stage IV essential hypertension mixed hyperlipidemia with a severe anemia causing diastolic dysfunction heart failure and metoprolol causing severe bradycardia slightly improved at this time with non-ST elevation myocardial infarction from demand ischemia 1.  Continue need to watch closely for significant anemia causing shortness of breath weakness and fatigue  2.  No further cardiac diagnostics necessary at this time 3.  Abstain from beta-blocker due to bradycardia 4.  High intensity cholesterol therapy with atorvastatin 5.  Begin ambulation follow-up for improvements of symptoms and need for adjustments of medication management and okay from the cardiac standpoint for discharge to home today  Signed, Serafina Royals M.D. FACC

## 2018-06-09 ENCOUNTER — Telehealth: Payer: Self-pay

## 2018-06-09 LAB — METHYLMALONIC ACID, SERUM: METHYLMALONIC ACID, QUANTITATIVE: 392 nmol/L — AB (ref 0–378)

## 2018-06-09 NOTE — Telephone Encounter (Signed)
Left msg for pt to call back for TCM and to schedule hospital follow up appt. Pt to call office at [phone].

## 2018-06-13 ENCOUNTER — Telehealth: Payer: Self-pay

## 2018-06-13 NOTE — Telephone Encounter (Signed)
Patient daughter Jones Broom called stating he seen Dr Zollie Scale for low heart rate. It was at 40 so they sent him to [facility] hospital. He was admitted for 2 days and they found out his hemoglobin was low and that is the cause along with FSGS. He was taken off two medications but she said she is unsure the names. They had to give patient blood transfusion. He is going to follow up with Dr Zollie Scale on Monday to have labs rechecked.   FYI  CB# [phone]

## 2018-06-13 NOTE — Telephone Encounter (Signed)
Unable to reach patient for TCM call for hospital follow up. Needs to schedule appt for follow up with Dr. Army Melia. Please notify patient and schedule appt. Thank you!

## 2018-06-16 DIAGNOSIS — D631 Anemia in chronic kidney disease: Secondary | ICD-10-CM | POA: Diagnosis not present

## 2018-06-16 DIAGNOSIS — N041 Nephrotic syndrome with focal and segmental glomerular lesions: Secondary | ICD-10-CM | POA: Diagnosis not present

## 2018-06-16 DIAGNOSIS — I10 Essential (primary) hypertension: Secondary | ICD-10-CM | POA: Diagnosis not present

## 2018-06-16 DIAGNOSIS — N2581 Secondary hyperparathyroidism of renal origin: Secondary | ICD-10-CM | POA: Diagnosis not present

## 2018-06-16 DIAGNOSIS — N183 Chronic kidney disease, stage 3 (moderate): Secondary | ICD-10-CM | POA: Diagnosis not present

## 2018-06-16 DIAGNOSIS — R809 Proteinuria, unspecified: Secondary | ICD-10-CM | POA: Diagnosis not present

## 2018-06-16 NOTE — Telephone Encounter (Signed)
Phone is busy will try again another time.

## 2018-06-19 ENCOUNTER — Inpatient Hospital Stay: Payer: Medicare Other | Admitting: Oncology

## 2018-06-19 ENCOUNTER — Inpatient Hospital Stay: Payer: Medicare Other | Attending: Oncology

## 2018-06-19 ENCOUNTER — Inpatient Hospital Stay: Payer: Medicare Other

## 2018-06-19 ENCOUNTER — Encounter: Payer: Self-pay | Admitting: Oncology

## 2018-06-19 ENCOUNTER — Other Ambulatory Visit: Payer: Self-pay

## 2018-06-19 VITALS — BP 167/75 | HR 88 | Temp 96.5°F | Resp 18 | Wt 184.5 lb

## 2018-06-19 DIAGNOSIS — D631 Anemia in chronic kidney disease: Secondary | ICD-10-CM

## 2018-06-19 DIAGNOSIS — N051 Unspecified nephritic syndrome with focal and segmental glomerular lesions: Secondary | ICD-10-CM

## 2018-06-19 DIAGNOSIS — Z87891 Personal history of nicotine dependence: Secondary | ICD-10-CM

## 2018-06-19 DIAGNOSIS — N2889 Other specified disorders of kidney and ureter: Secondary | ICD-10-CM

## 2018-06-19 DIAGNOSIS — I129 Hypertensive chronic kidney disease with stage 1 through stage 4 chronic kidney disease, or unspecified chronic kidney disease: Secondary | ICD-10-CM

## 2018-06-19 DIAGNOSIS — N184 Chronic kidney disease, stage 4 (severe): Secondary | ICD-10-CM

## 2018-06-19 DIAGNOSIS — D649 Anemia, unspecified: Secondary | ICD-10-CM

## 2018-06-19 DIAGNOSIS — E538 Deficiency of other specified B group vitamins: Secondary | ICD-10-CM | POA: Diagnosis not present

## 2018-06-19 DIAGNOSIS — D519 Vitamin B12 deficiency anemia, unspecified: Secondary | ICD-10-CM

## 2018-06-19 LAB — CBC WITH DIFFERENTIAL/PLATELET
Abs Immature Granulocytes: 0.03 10*3/uL (ref 0.00–0.07)
Basophils Absolute: 0 10*3/uL (ref 0.0–0.1)
Basophils Relative: 0 %
Eosinophils Absolute: 0.1 10*3/uL (ref 0.0–0.5)
Eosinophils Relative: 1 %
HCT: 29.6 % — ABNORMAL LOW (ref 39.0–52.0)
Hemoglobin: 9.2 g/dL — ABNORMAL LOW (ref 13.0–17.0)
Immature Granulocytes: 0 %
Lymphocytes Relative: 14 %
Lymphs Abs: 1.1 10*3/uL (ref 0.7–4.0)
MCH: 29.8 pg (ref 26.0–34.0)
MCHC: 31.1 g/dL (ref 30.0–36.0)
MCV: 95.8 fL (ref 80.0–100.0)
MONO ABS: 0.4 10*3/uL (ref 0.1–1.0)
MONOS PCT: 5 %
Neutro Abs: 6.1 10*3/uL (ref 1.7–7.7)
Neutrophils Relative %: 80 %
Platelets: 282 10*3/uL (ref 150–400)
RBC: 3.09 MIL/uL — ABNORMAL LOW (ref 4.22–5.81)
RDW: 15.9 % — ABNORMAL HIGH (ref 11.5–15.5)
WBC: 7.6 10*3/uL (ref 4.0–10.5)
nRBC: 0 % (ref 0.0–0.2)

## 2018-06-19 MED ORDER — CYANOCOBALAMIN 1000 MCG/ML IJ SOLN
1000.0000 ug | Freq: Once | INTRAMUSCULAR | Status: AC
  Administered 2018-06-19: 1000 ug via INTRAMUSCULAR

## 2018-06-19 MED ORDER — CYANOCOBALAMIN 1000 MCG/ML IJ SOLN
INTRAMUSCULAR | Status: AC
  Filled 2018-06-19: qty 1

## 2018-06-19 NOTE — Progress Notes (Signed)
Hematology/Oncology Follow up note [facility] [phone] [phone]   Patient Care Team: Glean Hess, MD as PCP - General (Internal Medicine) Hollice Espy, MD as Consulting Physician (Urology) Anthonette Legato, MD (Nephrology) Earlie Server, MD as Consulting Physician (Hematology and Oncology) Ubaldo Glassing Javier Docker, MD as Consulting Physician (Cardiology)  REFERRING PROVIDER: Dr.Lateef REASON FOR VISIT Follow up for treatment of anemia of CKD  HISTORY OF PRESENTING ILLNESS:  Arthur Bowen is a  78 y.o.  male with PMH listed below who was referred to me for evaluation of anemia of CKD.  Patient also has left renal mass which is being followed by urology.  He had recent labs done at the nephrologist office on October 15, 2017 which showed WBC 9.2, hemoglobin 9.1, MCV 89, platelet counts 401 thousand, normal differential.  Creatinine 2.11,  Reports feeling tired.Denies hematochezia, hematuria, hematemesis, epistaxis, black tarry stool or easy bruising.  Last colonoscopy: never had colonoscopy.  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 Renal mass, follows up with Dr.Brandon. Previous negative biopsy [place] of kidney on 11/18/2017  was Independently reviewed by me and discussed with patient. US showed stable left kidney mass, 5.1cm.  I communicated with Dr.Brandon who cleared patient to start procrit therapy.  INTERVAL HISTORY Arthur Bowen is a 78 y.o. male who has above history reviewed by me presents for post hospitalization follow-up for anemia and vitamin B12 deficiency.  Patient has anemia secondary to CKD and had received monthly Procrit.  Due to the cost, patient request to increase the interval between each Procrit injections. Last dose was in September.   On Cellcept for FSGS.  Patient was admitted from 06/05/2018 to 06/07/2018 due to symptomatic bradycardia, which was thought to due to beta-blocker therapy which was discontinued in the hospital.  Cardiology  see patient in house.   Patient also has symptomatic anemia with decrease of hemoglobin to 6.4 S/p 2 units of PRBC transfusion. Work-up in the hospital showed stable iron panel with ferritin 247, iron saturation 33, normal folate level.  B12 level was checked on Oct 24, 2017 which showed level of 6177 however during recent hospitalization, B12 level decreased to 311. MMA was checked which was elevated. Patient received parenteral vitamin B12 injection 1000 MCG x2.  Today patient presents for follow-up.  Accompanied by daughter.  Reports feeling much better comparing to when he was in the hospital.  Denies any chest pain, palpitation, dizziness, abdominal pain.   Review of Systems  Constitutional: Negative for chills, fever, malaise/fatigue and weight loss.  HENT: Negative for sore throat.   Eyes: Negative for redness.  Respiratory: Negative for cough, shortness of breath and wheezing.   Cardiovascular: Negative for chest pain, palpitations and leg swelling.  Gastrointestinal: Negative for abdominal pain, blood in stool, nausea and vomiting.  Genitourinary: Negative for dysuria.  Musculoskeletal: Negative for myalgias.  Skin: Negative for rash.  Neurological: Negative for dizziness, tingling and tremors.  Endo/Heme/Allergies: Does not bruise/bleed easily.  Psychiatric/Behavioral: Negative for hallucinations.    MEDICAL HISTORY:  Past Medical History:  Diagnosis Date  . Acquired phimosis   . BPH (benign prostatic hypertrophy)   . Chronic kidney disease   . Dyspnea    with exertion  . Elevated PSA   . Gross hematuria   . Hematuria   . History of hiatal hernia   . Hypertension   . 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 Renal mass, left     SURGICAL HISTORY: Past  Surgical History:  Procedure Laterality Date  . Circumscision  09/2015  . CORONARY ARTERY BYPASS GRAFT  2002   x3 @ [place]  . INGUINAL HERNIA REPAIR Left 2011  . RENAL BIOPSY  2014   neg in w/u renal  mass/hematuria    SOCIAL HISTORY: Social History   Socioeconomic History  . Marital status: Divorced    Spouse name: Not on file  . Number of children: 2  . Years of education: Not on file  . Highest education level: Not on file  Occupational History  . Not on file  Social Needs  . Financial resource strain: Patient refused  . Food insecurity:    Worry: Patient refused    Inability: Patient refused  . Transportation needs:    Medical: Patient refused    Non-medical: Patient refused  Tobacco Use  . Smoking status: Former Smoker    Packs/day: 0.50    Years: 50.00    Pack years: 25.00    Types: Cigarettes    Last attempt to quit: 10/10/2017    Years since quitting: 0.6  . Smokeless tobacco: Former Systems developer    Types: Chew  . Tobacco comment: last cig 10/10/17   Substance and Sexual Activity  . Alcohol use: No    Alcohol/week: 0.0 standard drinks  . Drug use: No  . Sexual activity: Not on file  Lifestyle  . Physical activity:    Days per week: Patient refused    Minutes per session: Patient refused  . Stress: Patient refused  Relationships  . Social connections:    Talks on phone: Patient refused    Gets together: Patient refused    Attends religious service: Patient refused    Active member of club or organization: Patient refused    Attends meetings of clubs or organizations: Patient refused    Relationship status: Patient refused  . Intimate partner violence:    Fear of current or ex partner: Patient refused    Emotionally abused: Patient refused    Physically abused: Patient refused    Forced sexual activity: Patient refused  Other Topics Concern  . Not on file  Social History Narrative   Patient refused all questions- 06/03/2017    FAMILY HISTORY: Family History  Problem Relation Age of Onset  . Lymphoma Mother   . Hodgkin's lymphoma Mother   . Lung cancer Father   . Heart disease Brother   . Dementia Sister   . Dementia Brother   . Breast cancer  Maternal Aunt     ALLERGIES:  is allergic to penicillins and sulfa antibiotics.  MEDICATIONS:  Current Outpatient Medications  Medication Sig Dispense Refill  . amLODipine (NORVASC) 5 MG tablet Take 1 tablet (5 mg total) by mouth daily. 90 tablet 3  . aspirin 81 MG tablet Take 1 tablet by mouth daily. Reported on 09/02/2015    . atorvastatin (LIPITOR) 40 MG tablet TAKE 1 TABLET BY MOUTH ONCE DAILY (Patient taking differently: Take 20 mg by mouth daily at 6 PM. ) 90 tablet 3  . calcitRIOL (ROCALTROL) 0.25 MCG capsule Take 0.25 mcg by mouth daily.    . Cyanocobalamin (VITAMIN B 12 PO) Take by mouth daily.    . finasteride (PROSCAR) 5 MG tablet TAKE 1 TABLET BY MOUTH ONCE DAILY 90 tablet 3  . fluticasone (FLONASE) 50 MCG/ACT nasal spray Place 2 sprays into both nostrils daily. 16 g 5  . hydrALAZINE (APRESOLINE) 25 MG tablet Take 2 tablets (50 mg total) by mouth 3 (  three) times daily. 90 tablet 0  . mycophenolate (CELLCEPT) 500 MG tablet Take 1,000 mg by mouth 2 (two) times daily.  3  . nitroGLYCERIN (NITROSTAT) 0.4 MG SL tablet Place 1 tablet (0.4 mg total) under the tongue every 5 (five) minutes as needed for chest pain. 50 tablet 3   No current facility-administered medications for this visit.      PHYSICAL EXAMINATION: ECOG PERFORMANCE STATUS: 1 - Symptomatic but completely ambulatory Vitals:   06/19/18 0847  BP: (!) 167/75  Pulse: 88  Resp: 18  Temp: (!) 96.5 F (35.8 C)   Filed Weights   06/19/18 0849  Weight: 184 lb 8.4 oz (83.7 kg)    Physical Exam Constitutional:      General: He is not in acute distress.    Appearance: He is well-developed.  HENT:     Head: Normocephalic and atraumatic.     Right Ear: External ear normal.     Left Ear: External ear normal.  Eyes:     General: No scleral icterus.    Conjunctiva/sclera: Conjunctivae normal.     Pupils: Pupils are equal, round, and reactive to light.     Comments: Pale conjuctivae  Neck:     Musculoskeletal:  Normal range of motion and neck supple.  Cardiovascular:     Rate and Rhythm: Normal rate and regular rhythm.     Heart sounds: Normal heart sounds.  Pulmonary:     Effort: Pulmonary effort is normal. No respiratory distress.     Breath sounds: Normal breath sounds. No wheezing or rales.  Chest:     Chest wall: No tenderness.  Abdominal:     General: Bowel sounds are normal. There is no distension.     Palpations: Abdomen is soft. There is no mass.     Tenderness: There is no abdominal tenderness.  Musculoskeletal: Normal range of motion.        General: No deformity.     Comments: Bilateral trace edema.   Lymphadenopathy:     Cervical: No cervical adenopathy.  Skin:    General: Skin is warm and dry.     Findings: No erythema or rash.  Neurological:     Mental Status: He is alert and oriented to person, place, and time.     Cranial Nerves: No cranial nerve deficit.     Coordination: Coordination normal.  Psychiatric:        Behavior: Behavior normal.        Thought Content: Thought content normal.      LABORATORY DATA:  I have reviewed the data as listed Lab Results  Component Value Date   WBC 7.6 06/19/2018   HGB 9.2 (L) 06/19/2018   HCT 29.6 (L) 06/19/2018   MCV 95.8 06/19/2018   PLT 282 06/19/2018   Recent Labs    07/29/17 0855  06/05/18 0837 06/05/18 1022 06/06/18 0208 06/07/18 0409  NA 138   < > 143 139 138 140  K 4.4   < > 5.2 4.3 4.3 3.9  CL 108   < > 109* 112* 113* 116*  CO2 22   < > 16* 18* 19* 19*  GLUCOSE 103*   < > 115* 106* 85 99  BUN 34*   < > 42* 46* 46* 38*  CREATININE 1.96*   < > 2.40* 2.49* 2.56* 2.15*  CALCIUM 8.7*   < > 8.9 8.5* 7.8* 8.0*  GFRNONAA 31*   < > 25* 24* 23* 28*  GFRAA 36*   < >  29* 27* 27* 33*  PROT 6.2*  --  4.4* 5.2*  --   --   ALBUMIN 2.9*  --  3.2* 3.1*  --   --   AST 19  --  12 20  --   --   ALT 16*  --  20 20  --   --   ALKPHOS 124  --  73 64  --   --   BILITOT 0.6  --  <0.2 0.3  --   --    < > = values in this  interval not displayed.       ASSESSMENT & PLAN:  1. Anemia in stage 4 chronic kidney disease ([place])   2. FSGS (focal segmental glomerulosclerosis)   3. Renal mass   4. Anemia due to vitamin B12 deficiency, unspecified B12 deficiency type   5. Vitamin B12 deficiency    #Anemia: Multifactorial secondary to CellCept and anemia of chronic kidney disease, and vitamin B12 deficiency. Patient never had colonoscopy and is not interested for obtaining one. stool occult was negative on 06/07/2018 during  his most recent admission.   Continue vitamin B12 intramuscular 1000 MCG weekly x4.  Check intrinsic factor antibody and antiparietal antibody at next visit. Patient declined Procrit 50,000 units today.  Patient prefers to come back in 4 weeks repeat hemoglobin and decide. Also his insurance no long covers procrit, will switch to Retacrit.   Daughter also wants to talk to Education officer, museum for questions of applying Medicaid.  #Continue follow-up with nephrologist for treatment of FSGS/CKD. #Renal mass, follows up with urology, was considered to be likely a benign etiology   Return of visit: 4 weeks to establish care with Dr. Kem Parkinson team.  Earlie Server, MD, Ph D Hematology Oncology [place] [facility] Pager- [phone] 06/19/2018

## 2018-06-19 NOTE — Progress Notes (Signed)
Patient her for hospital follow up. Patient's blood pressure elevated, denies dizziness or headache. Metoprolol was discontinued at hospital.

## 2018-06-19 NOTE — Patient Instructions (Signed)
Cyanocobalamin, Vitamin B12 injection What is this medicine? CYANOCOBALAMIN (sye an oh koe BAL a min) is a man made form of vitamin B12. Vitamin B12 is used in the growth of healthy blood cells, nerve cells, and proteins in the body. It also helps with the metabolism of fats and carbohydrates. This medicine is used to treat people who can not absorb vitamin B12. This medicine may be used for other purposes; ask your health care provider or pharmacist if you have questions. COMMON BRAND NAME(S): B-12 Compliance Kit, B-12 Injection Kit, Cyomin, LA-12, Nutri-Twelve, Physicians EZ Use B-12, Primabalt What should I tell my health care provider before I take this medicine? They need to know if you have any of these conditions: -kidney disease -Leber's disease -megaloblastic anemia -an unusual or allergic reaction to cyanocobalamin, cobalt, other medicines, foods, dyes, or preservatives -pregnant or trying to get pregnant -breast-feeding How should I use this medicine? This medicine is injected into a muscle or deeply under the skin. It is usually given by a health care professional in a clinic or doctor's office. However, your doctor may teach you how to inject yourself. Follow all instructions. Talk to your pediatrician regarding the use of this medicine in children. Special care may be needed. Overdosage: If you think you have taken too much of this medicine contact a poison control center or emergency room at once. NOTE: This medicine is only for you. Do not share this medicine with others. What if I miss a dose? If you are given your dose at a clinic or doctor's office, call to reschedule your appointment. If you give your own injections and you miss a dose, take it as soon as you can. If it is almost time for your next dose, take only that dose. Do not take double or extra doses. What may interact with this medicine? -colchicine -heavy alcohol intake This list may not describe all possible  interactions. Give your health care provider a list of all the medicines, herbs, non-prescription drugs, or dietary supplements you use. Also tell them if you smoke, drink alcohol, or use illegal drugs. Some items may interact with your medicine. What should I watch for while using this medicine? Visit your doctor or health care professional regularly. You may need blood work done while you are taking this medicine. You may need to follow a special diet. Talk to your doctor. Limit your alcohol intake and avoid smoking to get the best benefit. What side effects may I notice from receiving this medicine? Side effects that you should report to your doctor or health care professional as soon as possible: -allergic reactions like skin rash, itching or hives, swelling of the face, lips, or tongue -blue tint to skin -chest tightness, pain -difficulty breathing, wheezing -dizziness -red, swollen painful area on the leg Side effects that usually do not require medical attention (report to your doctor or health care professional if they continue or are bothersome): -diarrhea -headache This list may not describe all possible side effects. Call your doctor for medical advice about side effects. You may report side effects to FDA at 1-800-FDA-1088. Where should I keep my medicine? Keep out of the reach of children. Store at room temperature between 15 and 30 degrees C (59 and 85 degrees F). Protect from light. Throw away any unused medicine after the expiration date. NOTE: This sheet is a summary. It may not cover all possible information. If you have questions about this medicine, talk to your doctor, pharmacist, or  health care provider.  2019 Elsevier/Gold Standard (2007-09-15 22:10:20)  

## 2018-06-20 ENCOUNTER — Telehealth: Payer: Self-pay

## 2018-06-20 ENCOUNTER — Other Ambulatory Visit: Payer: Self-pay

## 2018-06-20 DIAGNOSIS — D649 Anemia, unspecified: Secondary | ICD-10-CM

## 2018-06-20 MED ORDER — CYANOCOBALAMIN 1000 MCG/ML IJ SOLN: INTRAMUSCULAR | 0 refills | Status: DC

## 2018-06-20 NOTE — Telephone Encounter (Signed)
Patient's daughter, Arthur Bowen, called to request that B12 injections be sent to [facility] so she can administer at home instead of him coming to facility to get them.  Per Tammie, she has given him injections before. Ok per Dr. Tasia Catchings. B12 injection prescription sent to pharmacy and patient's daughter notified. Message sent to scheduling to cancel injection appointments. He will return to clinic to see Dr. Mike Gip on 1/30, Tammy aware and voiced understanding.

## 2018-06-26 ENCOUNTER — Ambulatory Visit: Payer: Medicare Other

## 2018-07-03 ENCOUNTER — Ambulatory Visit: Payer: Medicare Other

## 2018-07-03 DIAGNOSIS — I129 Hypertensive chronic kidney disease with stage 1 through stage 4 chronic kidney disease, or unspecified chronic kidney disease: Secondary | ICD-10-CM | POA: Diagnosis not present

## 2018-07-03 DIAGNOSIS — I10 Essential (primary) hypertension: Secondary | ICD-10-CM | POA: Diagnosis not present

## 2018-07-03 DIAGNOSIS — E785 Hyperlipidemia, unspecified: Secondary | ICD-10-CM | POA: Diagnosis not present

## 2018-07-03 DIAGNOSIS — R000 Tachycardia, unspecified: Secondary | ICD-10-CM | POA: Diagnosis not present

## 2018-07-03 DIAGNOSIS — R001 Bradycardia, unspecified: Secondary | ICD-10-CM | POA: Diagnosis not present

## 2018-07-03 DIAGNOSIS — I25119 Atherosclerotic heart disease of native coronary artery with unspecified angina pectoris: Secondary | ICD-10-CM | POA: Diagnosis not present

## 2018-07-17 ENCOUNTER — Ambulatory Visit: Payer: Medicare Other | Admitting: Hematology and Oncology

## 2018-07-17 ENCOUNTER — Other Ambulatory Visit: Payer: Medicare Other

## 2018-07-17 ENCOUNTER — Ambulatory Visit: Payer: Medicare Other

## 2018-07-24 ENCOUNTER — Inpatient Hospital Stay: Payer: Medicare Other

## 2018-07-24 ENCOUNTER — Other Ambulatory Visit: Payer: Self-pay | Admitting: Hematology and Oncology

## 2018-07-24 ENCOUNTER — Inpatient Hospital Stay: Payer: Medicare Other | Admitting: Hematology and Oncology

## 2018-07-24 DIAGNOSIS — N184 Chronic kidney disease, stage 4 (severe): Secondary | ICD-10-CM

## 2018-07-24 DIAGNOSIS — D631 Anemia in chronic kidney disease: Secondary | ICD-10-CM | POA: Insufficient documentation

## 2018-07-24 NOTE — Progress Notes (Deleted)
[facility] day:  07/24/2018  Chief Complaint: Arthur Bowen is a 78 y.o. male with anemia of chronic kidney disease and B12 deficiency who is seen for new patient assessment.  HPI: ***  xxxx  The patient was initially seen by Dr. Tasia Catchings on 10/24/2017.  He was referred by Dr. Holley Raring for evaluation of anemia of CKD.  Labs on 10/15/2017 revealed a hemoglobin 9.1, MCV 89, platelets 401,000, WBC 9200 with a normal differential.  Creatinine was 2.11.  He felt tired.  He denied hematochezia, hematuria, hematemesis, epistaxis, black tarry stool or easy bruising.  He has never had colonoscopy.  Myeloma panel revealed no M-spike. Ferritin was 421, iron saturation 57% with a TIBC of 208, folate was 8.6.  B12 was 6177.  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  He has had several family members die of FSGS.  He is anticipating that he will need dialysis in the future.  xxxx  He is followed by Dr. Erlene Quan for a right renal mass/cyst.  He was last seen on 11/22/2017.  He was noted to have a stable complex left renal cyst present on CT scan since at least 08/04/2012.  Mass measured 4.8 x 4.4 cm.  Biopsy on 09/15/2012 revealed mostly blood and hemosiderin laden macrophages consistent with hemorrhagic cyst. There were no malignant cells.   Abdomen and pelvic CT on 08/24/2015 revealed a 3.9 x 5.0 cm heterogeneous nonenhancing lesion in the interpolar left kidney stable in size from 08/04/2012. Findings were most indicative of a mildly complex/hemorrhagic/proteinaceous cyst..  Renal ultrasound on 11/18/2017 revealed a 5.1 cm predominantly solid mass arising from the left mid upper pole which was not significantly changed.  He denied any flank pain or gross hematuria.  Dr Erlene Quan plans to continue to follow with annual surveillance imaging.  In light of recent onset of FSGS and progressive renal insufficiency, she would consider nephrectomy in the future if he is on dialysis  and the lesion grows.   xxxx  He has anemia secondary to CKD and had received monthly Procrit.  Due to the cost, patient request to increase the interval between each Procrit injections.  xxxx  The patient was admitted to [facility] from 06/05/2018 - 06/07/2018 with symptomatic bradycardia thought secondary to beta-blocker therapy.  He also had symptomatic anemia with a hematocrit of 21.1, hemoglobin of 6.4, and MCV 97.7.   Work-up revealed a normal ferritin (247), iron saturation 33%, folate (13.8), and TSH (4.37).  Retic was 2.9%.  B12 was 311.  Prior B12 level on 10/24/2017 was 6177.  MMA was elevated.    He was last seen by Dr Tasia Catchings on 06/19/2018.  CBC revealed a hematocrit of 29.6, hemoglobin 9.2, and MCV 95.8.  He declined Procrit.  xxxx  He has received Procrit on several occasions: 40,000 units on 11/21/2017, 50,0000 units on 12/26/2017, 50,0000 units on 01/23/2018, and 50,0000 units on 02/20/2018.  He received 1 unit of PRBCs on 06/05/2018 and 2 units of PRBCs on 06/06/2018.  He has received B12 on 06/06/2018 and 06/19/2018.  xxxx   Past Medical History:  Diagnosis Date  . Acquired phimosis   . BPH (benign prostatic hypertrophy)   . Chronic kidney disease   . Dyspnea    with exertion  . Elevated PSA   . Gross hematuria   . Hematuria   . History of hiatal hernia   . Hypertension   . 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 Renal mass, left     Past Surgical History:  Procedure Laterality Date  . Circumscision  09/2015  . CORONARY ARTERY BYPASS GRAFT  2002   x3 @ [place]  . INGUINAL HERNIA REPAIR Left 2011  . RENAL BIOPSY  2014   neg in w/u renal mass/hematuria    Family History  Problem Relation Age of Onset  . Lymphoma Mother   . Hodgkin's lymphoma Mother   . Lung cancer Father   . Heart disease Brother   . Dementia Sister   . Dementia Brother   . Breast cancer Maternal Aunt     Social History:  reports that he quit smoking about 9 months ago. His  smoking use included cigarettes. He has a 25.00 pack-year smoking history. He has quit using smokeless tobacco.  His smokeless tobacco use included chew. He reports that he does not drink alcohol or use drugs.  The patient is accompanied by *** alone today.  Allergies:  Allergies  Allergen Reactions  . Penicillins Other (See Comments)  . Sulfa Antibiotics Other (See Comments)    Current Medications: Current Outpatient Medications  Medication Sig Dispense Refill  . amLODipine (NORVASC) 5 MG tablet Take 1 tablet (5 mg total) by mouth daily. 90 tablet 3  . aspirin 81 MG tablet Take 1 tablet by mouth daily. Reported on 09/02/2015    . atorvastatin (LIPITOR) 40 MG tablet TAKE 1 TABLET BY MOUTH ONCE DAILY (Patient taking differently: Take 20 mg by mouth daily at 6 PM. ) 90 tablet 3  . calcitRIOL (ROCALTROL) 0.25 MCG capsule Take 0.25 mcg by mouth daily.    . cyanocobalamin (,VITAMIN B-12,) 1000 MCG/ML injection Inject 1000 mcg (1 ml) into the muscle once a week for 2 weeks. 2 mL 0  . Cyanocobalamin (VITAMIN B 12 PO) Take by mouth daily.    . finasteride (PROSCAR) 5 MG tablet TAKE 1 TABLET BY MOUTH ONCE DAILY 90 tablet 3  . fluticasone (FLONASE) 50 MCG/ACT nasal spray Place 2 sprays into both nostrils daily. 16 g 5  . hydrALAZINE (APRESOLINE) 25 MG tablet Take 2 tablets (50 mg total) by mouth 3 (three) times daily. 90 tablet 0  . mycophenolate (CELLCEPT) 500 MG tablet Take 1,000 mg by mouth 2 (two) times daily.  3  . nitroGLYCERIN (NITROSTAT) 0.4 MG SL tablet Place 1 tablet (0.4 mg total) under the tongue every 5 (five) minutes as needed for chest pain. 50 tablet 3   No current facility-administered medications for this visit.     Review of Systems:  GENERAL:  Feels good.  Active.  No fevers, sweats or weight loss. PERFORMANCE STATUS (ECOG):  *** HEENT:  No visual changes, runny nose, sore throat, mouth sores or tenderness. Lungs: No shortness of breath or cough.  No hemoptysis. Cardiac:  No  chest pain, palpitations, orthopnea, or PND. GI:  No nausea, vomiting, diarrhea, constipation, melena or hematochezia. GU:  No urgency, frequency, dysuria, or hematuria. Musculoskeletal:  No back pain.  No joint pain.  No muscle tenderness. Extremities:  No pain or swelling. Skin:  No rashes or skin changes. Neuro:  No headache, numbness or weakness, balance or coordination issues. Endocrine:  No diabetes, thyroid issues, hot flashes or night sweats. Psych:  No mood changes, depression or anxiety. Pain:  No focal pain. Review of systems:  All other systems reviewed and found to be negative.  Physical Exam: There were no vitals taken for this visit. GENERAL:  Well developed, well  nourished, **man sitting comfortably in the exam room in no acute distress. MENTAL STATUS:  Alert and oriented to person, place and time. HEAD:  *** hair.  Normocephalic, atraumatic, face symmetric, no Cushingoid features. EYES:  *** eyes.  Pupils equal round and reactive to light and accomodation.  No conjunctivitis or scleral icterus. ENT:  Oropharynx clear without lesion.  Tongue normal. Mucous membranes moist.  RESPIRATORY:  Clear to auscultation without rales, wheezes or rhonchi. CARDIOVASCULAR:  Regular rate and rhythm without murmur, rub or gallop. ABDOMEN:  Soft, non-tender, with active bowel sounds, and no hepatosplenomegaly.  No masses. SKIN:  No rashes, ulcers or lesions. EXTREMITIES: No edema, no skin discoloration or tenderness.  No palpable cords. LYMPH NODES: No palpable cervical, supraclavicular, axillary or inguinal adenopathy  NEUROLOGICAL: Unremarkable. PSYCH:  Appropriate.   No visits with results within 3 Day(s) from this visit.  Latest known visit with results is:  Appointment on 06/19/2018  Component Date Value Ref Range Status  . WBC 06/19/2018 7.6  4.0 - 10.5 K/uL Final  . RBC 06/19/2018 3.09* 4.22 - 5.81 MIL/uL Final  . Hemoglobin 06/19/2018 9.2* 13.0 - 17.0 g/dL Final  . HCT  06/19/2018 29.6* 39.0 - 52.0 % Final  . MCV 06/19/2018 95.8  80.0 - 100.0 fL Final  . MCH 06/19/2018 29.8  26.0 - 34.0 pg Final  . MCHC 06/19/2018 31.1  30.0 - 36.0 g/dL Final  . RDW 06/19/2018 15.9* 11.5 - 15.5 % Final  . Platelets 06/19/2018 282  150 - 400 K/uL Final  . nRBC 06/19/2018 0.0  0.0 - 0.2 % Final  . Neutrophils Relative % 06/19/2018 80  % Final  . Neutro Abs 06/19/2018 6.1  1.7 - 7.7 K/uL Final  . Lymphocytes Relative 06/19/2018 14  % Final  . Lymphs Abs 06/19/2018 1.1  0.7 - 4.0 K/uL Final  . Monocytes Relative 06/19/2018 5  % Final  . Monocytes Absolute 06/19/2018 0.4  0.1 - 1.0 K/uL Final  . Eosinophils Relative 06/19/2018 1  % Final  . Eosinophils Absolute 06/19/2018 0.1  0.0 - 0.5 K/uL Final  . Basophils Relative 06/19/2018 0  % Final  . Basophils Absolute 06/19/2018 0.0  0.0 - 0.1 K/uL Final  . Immature Granulocytes 06/19/2018 0  % Final  . Abs Immature Granulocytes 06/19/2018 0.03  0.00 - 0.07 K/uL Final   Performed at [facility], [address], [place], [place] [address]    Assessment:  Arthur Bowen is a 78 y.o. male ***   Plan: 1.  Labs today:  CBC with diff, ferritin, intrinsic factor antibody, antiparietal antibody. 2.  Anemia of chronic kidney disease  3.  Left renal mass  4.    Lequita Asal, MD  07/24/2018, 3:06 AM   I saw and evaluated the patient, participating in the key portions of the service and reviewing pertinent diagnostic studies and records.  I reviewed the nurse practitioner's note and agree with the findings and the plan.  The assessment and plan were discussed with the patient.  Additional diagnostic studies of *** are needed to clarify *** and would change the clinical management.  A few ***multiple questions were asked by the patient and answered.   Nolon Stalls, MD 07/24/2018,3:06 AM

## 2018-08-01 DIAGNOSIS — I129 Hypertensive chronic kidney disease with stage 1 through stage 4 chronic kidney disease, or unspecified chronic kidney disease: Secondary | ICD-10-CM | POA: Diagnosis not present

## 2018-08-01 DIAGNOSIS — I10 Essential (primary) hypertension: Secondary | ICD-10-CM | POA: Diagnosis not present

## 2018-08-01 DIAGNOSIS — I25119 Atherosclerotic heart disease of native coronary artery with unspecified angina pectoris: Secondary | ICD-10-CM | POA: Diagnosis not present

## 2018-08-01 DIAGNOSIS — I739 Peripheral vascular disease, unspecified: Secondary | ICD-10-CM | POA: Diagnosis not present

## 2018-08-01 DIAGNOSIS — R079 Chest pain, unspecified: Secondary | ICD-10-CM | POA: Diagnosis not present

## 2018-08-04 ENCOUNTER — Ambulatory Visit: Payer: Medicare Other | Admitting: Hematology and Oncology

## 2018-08-04 ENCOUNTER — Other Ambulatory Visit: Payer: Medicare Other

## 2018-08-04 ENCOUNTER — Ambulatory Visit: Payer: Medicare Other

## 2018-08-06 ENCOUNTER — Other Ambulatory Visit: Payer: Self-pay

## 2018-08-06 ENCOUNTER — Inpatient Hospital Stay: Payer: Medicare Other | Attending: Hematology and Oncology

## 2018-08-06 ENCOUNTER — Other Ambulatory Visit: Payer: Self-pay | Admitting: Hematology and Oncology

## 2018-08-06 ENCOUNTER — Encounter: Payer: Self-pay | Admitting: Emergency Medicine

## 2018-08-06 ENCOUNTER — Inpatient Hospital Stay
Admission: EM | Admit: 2018-08-06 | Discharge: 2018-08-07 | DRG: 684 | Disposition: A | Discharge status: Home / Self Care | Payer: Medicare Other | Attending: Internal Medicine | Admitting: Internal Medicine

## 2018-08-06 ENCOUNTER — Inpatient Hospital Stay (HOSPITAL_BASED_OUTPATIENT_CLINIC_OR_DEPARTMENT_OTHER): Payer: Medicare Other | Admitting: Hematology and Oncology

## 2018-08-06 ENCOUNTER — Encounter: Payer: Self-pay | Admitting: Hematology and Oncology

## 2018-08-06 ENCOUNTER — Inpatient Hospital Stay: Payer: Medicare Other

## 2018-08-06 VITALS — BP 127/70 | HR 84 | Temp 98.1°F | Resp 16 | Wt 187.8 lb

## 2018-08-06 DIAGNOSIS — N189 Chronic kidney disease, unspecified: Secondary | ICD-10-CM

## 2018-08-06 DIAGNOSIS — I2510 Atherosclerotic heart disease of native coronary artery without angina pectoris: Secondary | ICD-10-CM | POA: Diagnosis not present

## 2018-08-06 DIAGNOSIS — Z87891 Personal history of nicotine dependence: Secondary | ICD-10-CM | POA: Insufficient documentation

## 2018-08-06 DIAGNOSIS — Z880 Allergy status to penicillin: Secondary | ICD-10-CM | POA: Diagnosis not present

## 2018-08-06 DIAGNOSIS — E538 Deficiency of other specified B group vitamins: Secondary | ICD-10-CM | POA: Diagnosis not present

## 2018-08-06 DIAGNOSIS — Z7982 Long term (current) use of aspirin: Secondary | ICD-10-CM

## 2018-08-06 DIAGNOSIS — G629 Polyneuropathy, unspecified: Secondary | ICD-10-CM | POA: Diagnosis present

## 2018-08-06 DIAGNOSIS — Z951 Presence of aortocoronary bypass graft: Secondary | ICD-10-CM

## 2018-08-06 DIAGNOSIS — N184 Chronic kidney disease, stage 4 (severe): Secondary | ICD-10-CM

## 2018-08-06 DIAGNOSIS — Z8249 Family history of ischemic heart disease and other diseases of the circulatory system: Secondary | ICD-10-CM

## 2018-08-06 DIAGNOSIS — N051 Unspecified nephritic syndrome with focal and segmental glomerular lesions: Secondary | ICD-10-CM

## 2018-08-06 DIAGNOSIS — Z882 Allergy status to sulfonamides status: Secondary | ICD-10-CM

## 2018-08-06 DIAGNOSIS — D225 Melanocytic nevi of trunk: Secondary | ICD-10-CM | POA: Insufficient documentation

## 2018-08-06 DIAGNOSIS — I129 Hypertensive chronic kidney disease with stage 1 through stage 4 chronic kidney disease, or unspecified chronic kidney disease: Secondary | ICD-10-CM | POA: Insufficient documentation

## 2018-08-06 DIAGNOSIS — N400 Enlarged prostate without lower urinary tract symptoms: Secondary | ICD-10-CM | POA: Diagnosis present

## 2018-08-06 DIAGNOSIS — N183 Chronic kidney disease, stage 3 (moderate): Secondary | ICD-10-CM | POA: Diagnosis not present

## 2018-08-06 DIAGNOSIS — D72819 Decreased white blood cell count, unspecified: Secondary | ICD-10-CM | POA: Diagnosis present

## 2018-08-06 DIAGNOSIS — Z7952 Long term (current) use of systemic steroids: Secondary | ICD-10-CM

## 2018-08-06 DIAGNOSIS — Z66 Do not resuscitate: Secondary | ICD-10-CM | POA: Diagnosis not present

## 2018-08-06 DIAGNOSIS — E785 Hyperlipidemia, unspecified: Secondary | ICD-10-CM | POA: Diagnosis not present

## 2018-08-06 DIAGNOSIS — N2889 Other specified disorders of kidney and ureter: Secondary | ICD-10-CM | POA: Diagnosis not present

## 2018-08-06 DIAGNOSIS — D631 Anemia in chronic kidney disease: Secondary | ICD-10-CM

## 2018-08-06 DIAGNOSIS — I10 Essential (primary) hypertension: Secondary | ICD-10-CM | POA: Diagnosis not present

## 2018-08-06 DIAGNOSIS — I252 Old myocardial infarction: Secondary | ICD-10-CM

## 2018-08-06 DIAGNOSIS — Z79899 Other long term (current) drug therapy: Secondary | ICD-10-CM

## 2018-08-06 DIAGNOSIS — Z801 Family history of malignant neoplasm of trachea, bronchus and lung: Secondary | ICD-10-CM | POA: Insufficient documentation

## 2018-08-06 DIAGNOSIS — D649 Anemia, unspecified: Secondary | ICD-10-CM

## 2018-08-06 DIAGNOSIS — D519 Vitamin B12 deficiency anemia, unspecified: Secondary | ICD-10-CM

## 2018-08-06 DIAGNOSIS — Z803 Family history of malignant neoplasm of breast: Secondary | ICD-10-CM | POA: Insufficient documentation

## 2018-08-06 DIAGNOSIS — I739 Peripheral vascular disease, unspecified: Secondary | ICD-10-CM | POA: Diagnosis present

## 2018-08-06 DIAGNOSIS — Z807 Family history of other malignant neoplasms of lymphoid, hematopoietic and related tissues: Secondary | ICD-10-CM | POA: Insufficient documentation

## 2018-08-06 DIAGNOSIS — R531 Weakness: Secondary | ICD-10-CM | POA: Diagnosis not present

## 2018-08-06 DIAGNOSIS — N041 Nephrotic syndrome with focal and segmental glomerular lesions: Secondary | ICD-10-CM | POA: Diagnosis not present

## 2018-08-06 LAB — BASIC METABOLIC PANEL
Anion gap: 6 (ref 5–15)
BUN: 45 mg/dL — ABNORMAL HIGH (ref 8–23)
CHLORIDE: 114 mmol/L — AB (ref 98–111)
CO2: 21 mmol/L — ABNORMAL LOW (ref 22–32)
Calcium: 8.5 mg/dL — ABNORMAL LOW (ref 8.9–10.3)
Creatinine, Ser: 2.17 mg/dL — ABNORMAL HIGH (ref 0.61–1.24)
GFR calc Af Amer: 32 mL/min — ABNORMAL LOW (ref >60)
GFR calc non Af Amer: 28 mL/min — ABNORMAL LOW (ref >60)
Glucose, Bld: 93 mg/dL (ref 70–99)
Potassium: 3.9 mmol/L (ref 3.5–5.1)
Sodium: 141 mmol/L (ref 135–145)

## 2018-08-06 LAB — CBC WITH DIFFERENTIAL/PLATELET
Abs Immature Granulocytes: 0.03 10*3/uL (ref 0.00–0.07)
Basophils Absolute: 0 10*3/uL (ref 0.0–0.1)
Basophils Relative: 0 %
Eosinophils Absolute: 0 10*3/uL (ref 0.0–0.5)
Eosinophils Relative: 1 %
HCT: 19.3 % — ABNORMAL LOW (ref 39.0–52.0)
Hemoglobin: 5.8 g/dL — ABNORMAL LOW (ref 13.0–17.0)
Immature Granulocytes: 1 %
Lymphocytes Relative: 20 %
Lymphs Abs: 1.2 10*3/uL (ref 0.7–4.0)
MCH: 29.9 pg (ref 26.0–34.0)
MCHC: 30.1 g/dL (ref 30.0–36.0)
MCV: 99.5 fL (ref 80.0–100.0)
MONO ABS: 0.4 10*3/uL (ref 0.1–1.0)
Monocytes Relative: 7 %
Neutro Abs: 4.2 10*3/uL (ref 1.7–7.7)
Neutrophils Relative %: 71 %
Platelets: 302 10*3/uL (ref 150–400)
RBC: 1.94 MIL/uL — ABNORMAL LOW (ref 4.22–5.81)
RDW: 17.4 % — ABNORMAL HIGH (ref 11.5–15.5)
WBC: 5.9 10*3/uL (ref 4.0–10.5)
nRBC: 0 % (ref 0.0–0.2)

## 2018-08-06 LAB — PREPARE RBC (CROSSMATCH)

## 2018-08-06 LAB — PROTIME-INR
INR: 0.94
Prothrombin Time: 12.5 seconds (ref 11.4–15.2)

## 2018-08-06 LAB — FERRITIN: Ferritin: 281 ng/mL (ref 24–336)

## 2018-08-06 LAB — RETICULOCYTES
Immature Retic Fract: 23.3 % — ABNORMAL HIGH (ref 2.3–15.9)
RBC.: 1.99 MIL/uL — ABNORMAL LOW (ref 4.22–5.81)
Retic Count, Absolute: 38.4 10*3/uL (ref 19.0–186.0)
Retic Ct Pct: 1.9 % (ref 0.4–3.1)

## 2018-08-06 MED ORDER — VITAMIN B-12 1000 MCG PO TABS
1000.0000 ug | ORAL_TABLET | Freq: Every day | ORAL | Status: DC
  Administered 2018-08-07: 1000 ug via ORAL
  Filled 2018-08-06: qty 1

## 2018-08-06 MED ORDER — ACETAMINOPHEN 650 MG RE SUPP: 650.0000 mg | Freq: Four times a day (QID) | RECTAL | Status: DC | PRN

## 2018-08-06 MED ORDER — PREDNISONE 10 MG PO TABS
10.0000 mg | ORAL_TABLET | Freq: Every day | ORAL | Status: DC
  Administered 2018-08-07: 10 mg via ORAL
  Filled 2018-08-06: qty 1

## 2018-08-06 MED ORDER — HYDRALAZINE HCL 50 MG PO TABS
50.0000 mg | ORAL_TABLET | Freq: Three times a day (TID) | ORAL | Status: DC
  Administered 2018-08-06 – 2018-08-07 (×3): 50 mg via ORAL
  Filled 2018-08-06 (×3): qty 1

## 2018-08-06 MED ORDER — FINASTERIDE 5 MG PO TABS
5.0000 mg | ORAL_TABLET | Freq: Every day | ORAL | Status: DC
  Administered 2018-08-07: 5 mg via ORAL
  Filled 2018-08-06: qty 1

## 2018-08-06 MED ORDER — MYCOPHENOLATE MOFETIL 250 MG PO CAPS
1000.0000 mg | ORAL_CAPSULE | Freq: Two times a day (BID) | ORAL | Status: DC
  Administered 2018-08-07 (×2): 1000 mg via ORAL
  Filled 2018-08-06 (×3): qty 4

## 2018-08-06 MED ORDER — ACETAMINOPHEN 325 MG PO TABS: 650.0000 mg | ORAL_TABLET | Freq: Four times a day (QID) | ORAL | Status: DC | PRN

## 2018-08-06 MED ORDER — HYDRALAZINE HCL 50 MG PO TABS: 50.0000 mg | ORAL_TABLET | Freq: Three times a day (TID) | ORAL | Status: DC

## 2018-08-06 MED ORDER — POLYETHYLENE GLYCOL 3350 17 G PO PACK: 17.0000 g | PACK | Freq: Every day | ORAL | Status: DC | PRN

## 2018-08-06 MED ORDER — NITROGLYCERIN 0.4 MG SL SUBL: 0.4000 mg | SUBLINGUAL_TABLET | SUBLINGUAL | Status: DC | PRN

## 2018-08-06 MED ORDER — AMLODIPINE BESYLATE 5 MG PO TABS
5.0000 mg | ORAL_TABLET | Freq: Every day | ORAL | Status: DC
  Administered 2018-08-06 – 2018-08-07 (×2): 5 mg via ORAL
  Filled 2018-08-06 (×2): qty 1

## 2018-08-06 MED ORDER — LISINOPRIL 20 MG PO TABS
20.0000 mg | ORAL_TABLET | Freq: Every day | ORAL | Status: DC
  Administered 2018-08-06 – 2018-08-07 (×2): 20 mg via ORAL
  Filled 2018-08-06: qty 1
  Filled 2018-08-06: qty 2

## 2018-08-06 MED ORDER — ATORVASTATIN CALCIUM 20 MG PO TABS: 20.0000 mg | ORAL_TABLET | Freq: Every day | ORAL | Status: DC

## 2018-08-06 MED ORDER — B-12 1000 MCG/ML IJ KIT: 1000.0000 ug | PACK | INTRAMUSCULAR | 6 refills | Status: DC

## 2018-08-06 MED ORDER — CALCITRIOL 0.25 MCG PO CAPS
0.2500 ug | ORAL_CAPSULE | Freq: Every day | ORAL | Status: DC
  Filled 2018-08-06: qty 1

## 2018-08-06 MED ORDER — SODIUM CHLORIDE 0.9 % IV SOLN
10.0000 mL/h | Freq: Once | INTRAVENOUS | Status: AC
  Administered 2018-08-06: 10 mL/h via INTRAVENOUS

## 2018-08-06 MED ORDER — ONDANSETRON HCL 4 MG PO TABS: 4.0000 mg | ORAL_TABLET | Freq: Four times a day (QID) | ORAL | Status: DC | PRN

## 2018-08-06 MED ORDER — ONDANSETRON HCL 4 MG/2ML IJ SOLN: 4.0000 mg | Freq: Four times a day (QID) | INTRAMUSCULAR | Status: DC | PRN

## 2018-08-06 NOTE — Progress Notes (Signed)
Pt here for follow up. Previous Dr. Tasia Catchings Patient. Denies any concerns at this time.

## 2018-08-06 NOTE — Progress Notes (Signed)
[facility] day:  08/06/2018  Chief Complaint: Arthur Bowen is a 78 y.o. male with anemia of chronic kidney disease and B12 deficiency who is seen for new patient assessment.  HPI:  The patient was initially seen by Dr. Tasia Catchings on 10/24/2017 for evaluation of anemia of chronic kidney disease.  CBC on 09/19/2017 revealed a hemoglobin 9.1, MVC 89, platelets 401,000, WBC 9200 with a normal differential.  Creatinine was 2.1.  He felt fatigued.  He denied any hematuria, melena, or hematochezia.  He had never had a colonoscopy and did not want one.  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    He has a history of a renal mass and sees Dr. Erlene Quan.  He had a negative CT guided biopsy on 09/15/2012.  Ultrasound of the kidney on 11/18/2017  revealed a stable 5.1 cm left kidney mass.  Dr. Erlene Quan cleared patient to start Procrit therapy.  He was admitted to [facility] from 06/05/2018 - 06/07/2018 secondary to symptomatic bradycardia thought to due to beta-blocker therapy.  He also had symptomatic anemia with decrease of hemoglobin to 6.4.  He received 2 units of PRBCs.  Work-up revealed a ferritin 247, iron saturation 33%, and normal folate.  B12 was 311.  B12 was 6177 on 10/24/2017. MMA was elevated.  He received parenteral vitamin B12 injection 1000 mcg x 2.  He was last seen by Dr. Tasia Catchings on 06/19/2018.  At that time, he was feeling better post hospitalization.  He declined Procrit.  Insurance required switch to Retacrit.  He has received Procrit: 40,000 units on 11/21/2017, 50,000 units on 12/26/2017, 50,000 units on 01/23/2018, 50,000 units on 02/20/2018.  Hemoglobin has been followed: 7.9 on 11/19/2017, 7.8 on 12/26/2017, 9.1 on 01/21/2018, 9.3 on 02/20/2018, and 9.2 on 06/19/2018.  He has received B12 on 06/06/2018 and 06/19/2018.  His daughter has given him B12 weekly x 2 (last 3-4 weeks ago).  Symptomatically, he feels "pretty good today". Patient denies that he has experienced  any B symptoms. He denies any interval infections. Patient has some minor cold symptoms. (+) rhinorrhea.  No sore throat or fevers. Patient denies shortness of breath. He confirms infrequent episodes of chest pain that he attributes to "indigestion"; none at present.  He denies any nausea or vomiting.  Cellcept therapy causes patient to have loose stools.   Patient denies bleeding; no hematochezia, melena, or gross hematuria.  Patient has never had a colonoscopy. He states, "I have never had one, and I don't want one. I am too old for that at this point".   He is followed by Dr. Holley Raring every 3 months. His last visit was on 06/16/2018. He states, "my kidney function is 29%".  He has not received Procrit/Retacrit since 02/2018. His daughter states, "We decided that there was no need. Dr. Holley Raring said that his counts would never get above 9.  It is a waste of his time and his money to come over here month after month for shots that don't do anything for him because of the holes in his kidneys".   Patient advises that he maintains an adequate appetite. He is eating well. Weight today is 187 lb 13.3 oz (85.2 kg), which compared to his last visit to the clinic, represents a 3 pound decrease.   Patient denies pain in the clinic today.   Past Medical History:  Diagnosis Date  . Acquired phimosis   . BPH (benign prostatic hypertrophy)   . Chronic  kidney disease   . Dyspnea    with exertion  . Elevated PSA   . Gross hematuria   . Hematuria   . History of hiatal hernia   . Hypertension   . 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 Renal mass, left     Past Surgical History:  Procedure Laterality Date  . Circumscision  09/2015  . CORONARY ARTERY BYPASS GRAFT  2002   x3 @ [place]  . INGUINAL HERNIA REPAIR Left 2011  . RENAL BIOPSY  2014   neg in w/u renal mass/hematuria    Family History  Problem Relation Age of Onset  . Lymphoma Mother   . Hodgkin's lymphoma Mother   . Lung cancer  Father   . Heart disease Brother   . Dementia Sister   . Dementia Brother   . Breast cancer Maternal Aunt     Social History:  reports that he quit smoking about 9 months ago. His smoking use included cigarettes. He has a 25.00 pack-year smoking history. He has quit using smokeless tobacco.  His smokeless tobacco use included chew. He reports that he does not drink alcohol or use drugs.  The patient is accompanied by his daughter, [name], today.  Allergies:  Allergies  Allergen Reactions  . Penicillins Other (See Comments)  . Sulfa Antibiotics Other (See Comments)    Current Medications: Current Outpatient Medications  Medication Sig Dispense Refill  . amLODipine (NORVASC) 5 MG tablet Take 1 tablet (5 mg total) by mouth daily. 90 tablet 3  . aspirin 81 MG tablet Take 1 tablet by mouth daily. Reported on 09/02/2015    . atorvastatin (LIPITOR) 40 MG tablet TAKE 1 TABLET BY MOUTH ONCE DAILY (Patient taking differently: Take 20 mg by mouth daily at 6 PM. ) 90 tablet 3  . calcitRIOL (ROCALTROL) 0.25 MCG capsule Take 0.25 mcg by mouth daily.    . Cyanocobalamin (VITAMIN B 12 PO) Take 1,000 mcg by mouth daily.     . finasteride (PROSCAR) 5 MG tablet TAKE 1 TABLET BY MOUTH ONCE DAILY 90 tablet 3  . fluticasone (FLONASE) 50 MCG/ACT nasal spray Place 2 sprays into both nostrils daily. (Patient taking differently: Place 2 sprays into both nostrils as needed. ) 16 g 5  . hydrALAZINE (APRESOLINE) 25 MG tablet Take 2 tablets (50 mg total) by mouth 3 (three) times daily. 90 tablet 0  . lisinopril (PRINIVIL,ZESTRIL) 20 MG tablet Take 20 mg by mouth daily.     . mycophenolate (CELLCEPT) 500 MG tablet Take 1,000 mg by mouth 2 (two) times daily.  3  . nitroGLYCERIN (NITROSTAT) 0.4 MG SL tablet Place 1 tablet (0.4 mg total) under the tongue every 5 (five) minutes as needed for chest pain. 50 tablet 3  . predniSONE (DELTASONE) 10 MG tablet Take 10 mg by mouth daily with breakfast.      No current  facility-administered medications for this visit.     Review of Systems:  GENERAL:  Feels "good today".  No fevers, sweats.  Weight loss of 3 pounds. PERFORMANCE STATUS (ECOG):  1 HEENT:  Runny nose "cold".  No visual changes, sore throat, mouth sores or tenderness. Lungs: No shortness of breath or cough.  No hemoptysis. Cardiac:  No chest pain, palpitations, orthopnea, or PND. GI:  Indigestion.  Diarrhea on Cellcept.  No nausea, vomiting, constipation, melena or hematochezia. GU:  No urgency, frequency, dysuria, or hematuria. Musculoskeletal:  No back pain.  No joint pain.  No muscle  tenderness. Extremities:  No pain or swelling. Skin:  Bruises on aspirin.  No rashes or skin changes. Neuro:  No headache, numbness or weakness, balance or coordination issues. Endocrine:  No diabetes, thyroid issues, hot flashes or night sweats. Psych:  No mood changes, depression or anxiety. Pain:  No focal pain. Review of systems:  All other systems reviewed and found to be negative.  Physical Exam: Blood pressure 127/70, pulse 84, temperature 98.1 F (36.7 C), temperature source Oral, resp. rate 16, weight 187 lb 13.3 oz (85.2 kg), SpO2 100 %. GENERAL:  Well developed, well nourished, gentleman sitting comfortably in the exam room in no acute distress. MENTAL STATUS:  Alert and oriented to person, place and time. HEAD:  Pearline Cables thin hair.  Male pattern baldness.  Normocephalic, atraumatic, face symmetric, no Cushingoid features. EYES:  Glasses. Blue eyes.  Pupils equal round and reactive to light and accomodation.  No conjunctivitis or scleral icterus. ENT:  Oropharynx clear without lesion.  Tongue normal. Mucous membranes moist.  RESPIRATORY:  Clear to auscultation without rales, wheezes or rhonchi. CARDIOVASCULAR:  Regular rate and rhythm without murmur, rub or gallop. ABDOMEN:  Soft, non-tender, with active bowel sounds, and no hepatosplenomegaly.  No masses. SKIN:  2 cm irregular bleeding fleshy  right back mole.  No rashes, ulcers or lesions. EXTREMITIES: No edema, no skin discoloration or tenderness.  No palpable cords. LYMPH NODES: No palpable cervical, supraclavicular, axillary or inguinal adenopathy  NEUROLOGICAL: Unremarkable. PSYCH:  Appropriate.   Appointment on 08/06/2018  Component Date Value Ref Range Status  . WBC 08/06/2018 5.9  4.0 - 10.5 K/uL Final  . RBC 08/06/2018 1.94* 4.22 - 5.81 MIL/uL Final  . Hemoglobin 08/06/2018 5.8* 13.0 - 17.0 g/dL Final  . HCT 08/06/2018 19.3* 39.0 - 52.0 % Final  . MCV 08/06/2018 99.5  80.0 - 100.0 fL Final  . MCH 08/06/2018 29.9  26.0 - 34.0 pg Final  . MCHC 08/06/2018 30.1  30.0 - 36.0 g/dL Final  . RDW 08/06/2018 17.4* 11.5 - 15.5 % Final  . Platelets 08/06/2018 302  150 - 400 K/uL Final  . nRBC 08/06/2018 0.0  0.0 - 0.2 % Final  . Neutrophils Relative % 08/06/2018 71  % Final  . Neutro Abs 08/06/2018 4.2  1.7 - 7.7 K/uL Final  . Lymphocytes Relative 08/06/2018 20  % Final  . Lymphs Abs 08/06/2018 1.2  0.7 - 4.0 K/uL Final  . Monocytes Relative 08/06/2018 7  % Final  . Monocytes Absolute 08/06/2018 0.4  0.1 - 1.0 K/uL Final  . Eosinophils Relative 08/06/2018 1  % Final  . Eosinophils Absolute 08/06/2018 0.0  0.0 - 0.5 K/uL Final  . Basophils Relative 08/06/2018 0  % Final  . Basophils Absolute 08/06/2018 0.0  0.0 - 0.1 K/uL Final  . Immature Granulocytes 08/06/2018 1  % Final  . Abs Immature Granulocytes 08/06/2018 0.03  0.00 - 0.07 K/uL Final   Performed at [facility], [address], [place], [place] [address]    Assessment:  Arthur Bowen is a 78 y.o. male with anemia of chronic renal disease and B12 deficiency.  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   He has received Procrit: 40,000 units on 11/21/2017, 50,000 units on 12/26/2017, 50,000 units on 01/23/2018, 50,000 units on 02/20/2018.  Hemoglobin has been followed: 7.9 on 11/19/2017, 7.8 on 12/26/2017, 9.1 on 01/21/2018, 9.3  on 02/20/2018, and 9.2 on 06/19/2018.  He has a left renal mass s/p  CT guided biopsy on 09/15/2012.  Pathology was negative.  Ultrasound of the kidney on 11/18/2017  revealed a stable 5.1 cm left kidney mass.  He is followed by Dr Erlene Quan.  He denies any hematuria.  He has B12 deficiency.  B12 was 311 on 06/05/2018.  He is on B12 injections (last 3-4 weeks ago at home)  He was admitted to [facility] from 06/05/2018 - 06/07/2018 secondary to symptomatic bradycardia (thought to due to beta-blocker) and symptomatic anemia.  Hemoglobin was 6.4.  He received 2 units of PRBCs.  Work-up revealed a ferritin 247, iron saturation 33%, and normal folate.  B12 was 311 (6177 on 10/24/2017).  He began B12 injections.  Symptomatically, he feels good today.  He notes some "indigestion".  He denies and shortness of breath or chest pain.  Hemoglobin is 5.8.   Plan: 1.  Labs today:  CBC with diff, ferritin, intrinsic factor antibodies, anti-parietal antibodies, retic. 2.  Anemia of chronic kidney disease  Discuss anemia of chronic renal disease and inadequate production of erythropoietin and need for supplementation.  Discuss goal of Procrit/Reatcit is not normalization of hemoglobin, but maintaining a safe level without need for transfusion.  Discuss daughter's concerns about cost of Procrit and hemoglobin goal of 10. 3.  B12 deficiency  Discuss need for continuation of B12 monthly at home.  Rx:  B12 1000 mcg IM monthly (daughter to administer). 4.  Acute normocytic anemia  Discuss acute drop in hemoglobin and need for transfusion.  Unable to provide transfusion support in outpatient department (infusion center in [place] and [place]) today.  Discuss transfer to [facility] ER and likely admission for PRBCs.  Etiology felt secondary to anemia of chronic renal disease (not receiving Procrit).  Patient denies any melena, hematochezia, or hematuria.  Patient declines colonoscopy. 5.  Transfer patient to ER. 6.  RTC in 1  week for MD assessment, labs (CBC with diff, CMP), and +/- Procrit/Retacrit.    Honor Loh, NP  08/06/2018, 10:35 AM   I saw and evaluated the patient, participating in the key portions of the service and reviewing pertinent diagnostic studies and records.  I reviewed the nurse practitioner's note and agree with the findings and the plan.  The assessment and plan were discussed with the patient.  Multiple questions were asked by the patient and answered.   Nolon Stalls, MD 08/06/2018,10:35 AM

## 2018-08-06 NOTE — ED Triage Notes (Signed)
Pt reports he had his lab work done this am and was advised to come to the ED due to a Hbg of 5.4.

## 2018-08-06 NOTE — H&P (Signed)
[facility] [place] NAME: Arthur Bowen    MR#:  [id number]  DATE OF BIRTH:  Aug 04, 1940  DATE OF ADMISSION:  08/06/2018  PRIMARY CARE PHYSICIAN: Glean Hess, MD   REQUESTING/REFERRING PHYSICIAN: Dr. Joni Fears  CHIEF COMPLAINT:   Sent from oncology office due to anemia HISTORY OF PRESENT ILLNESS:  Arthur Bowen  is a 78 y.o. 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  He was sent here for further evaluation.  He has consented for blood transfusion.  ED physician has ordered 2 units of PRBCs. Patient denies melena, hematochezia or hematemesis. Patient does report over the past several weeks he has had some shortness of breath however denies chest pain or fatigue. He used to get Procrit shots  however this was discontinued by nephrology team.  PAST MEDICAL HISTORY:   Past Medical History:  Diagnosis Date  . Acquired phimosis   . BPH (benign prostatic hypertrophy)   . Chronic kidney disease   . Dyspnea    with exertion  . Elevated PSA   . Gross hematuria   . Hematuria   . History of hiatal hernia   . Hypertension   . 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 Renal mass, left     PAST SURGICAL HISTORY:   Past Surgical History:  Procedure Laterality Date  . Circumscision  09/2015  . CORONARY ARTERY BYPASS GRAFT  2002   x3 @ [place]  . INGUINAL HERNIA REPAIR Left 2011  . RENAL BIOPSY  2014   neg in w/u renal mass/hematuria    SOCIAL HISTORY:   Social History   Tobacco Use  . Smoking status: Former Smoker    Packs/day: 0.50    Years: 50.00    Pack years: 25.00    Types: Cigarettes    Last attempt to quit: 10/10/2017    Years since quitting: 0.8  . Smokeless tobacco: Former Systems developer    Types: Chew  . Tobacco comment: last cig 10/10/17   Substance Use  Topics  . Alcohol use: No    Alcohol/week: 0.0 standard drinks    FAMILY HISTORY:   Family History  Problem Relation Age of Onset  . Lymphoma Mother   . Hodgkin's lymphoma Mother   . Lung cancer Father   . Heart disease Brother   . Dementia Sister   . Dementia Brother   . Breast cancer Maternal Aunt     DRUG ALLERGIES:   Allergies  Allergen Reactions  . Penicillins Other (See Comments)  . Sulfa Antibiotics Other (See Comments)    REVIEW OF SYSTEMS:   Review of Systems  Constitutional: Negative.  Negative for chills, fever and malaise/fatigue.  HENT: Negative.  Negative for ear discharge, ear pain, hearing loss, nosebleeds and sore throat.   Eyes: Negative.  Negative for blurred vision and pain.  Respiratory: Positive for shortness of breath. Negative for cough, hemoptysis and wheezing.   Cardiovascular: Positive for leg swelling (chronic ankle swelling). Negative for chest pain and palpitations.  Gastrointestinal: Negative.  Negative for abdominal pain, blood in stool, diarrhea, nausea and vomiting.  Genitourinary: Negative.  Negative for dysuria.  Musculoskeletal: Negative.  Negative for back pain.  Skin: Negative.   Neurological: Negative for dizziness, tremors, speech change, focal weakness, seizures and headaches.  Endo/Heme/Allergies: Negative.  Does not bruise/bleed easily.  Psychiatric/Behavioral: Negative.  Negative for depression, hallucinations and suicidal ideas.    MEDICATIONS AT HOME:   Prior to Admission medications   Medication Sig Start Date End Date Taking? Authorizing Provider  amLODipine (NORVASC) 5 MG tablet Take 1 tablet (5 mg total) by mouth daily. 06/03/17   Glean Hess, MD  aspirin 81 MG tablet Take 1 tablet by mouth daily. Reported on 09/02/2015    [provider]  atorvastatin (LIPITOR) 40 MG tablet TAKE 1 TABLET BY MOUTH ONCE DAILY Patient taking differently: Take 20 mg by mouth daily at 6 PM.  11/24/17   Glean Hess, MD   calcitRIOL (ROCALTROL) 0.25 MCG capsule Take 0.25 mcg by mouth daily.    [provider]  Cyanocobalamin (VITAMIN B 12 PO) Take 1,000 mcg by mouth daily.     [provider]  finasteride (PROSCAR) 5 MG tablet TAKE 1 TABLET BY MOUTH ONCE DAILY 09/13/17   Glean Hess, MD  fluticasone [facility]) 50 MCG/ACT nasal spray Place 2 sprays into both nostrils daily. Patient taking differently: Place 2 sprays into both nostrils as needed.  10/24/15   Glean Hess, MD  hydrALAZINE (APRESOLINE) 50 MG tablet Take 50 mg by mouth 3 (three) times daily. 07/11/18   [provider]  lisinopril (PRINIVIL,ZESTRIL) 20 MG tablet Take 20 mg by mouth daily.     [provider]  mycophenolate (CELLCEPT) 500 MG tablet Take 1,000 mg by mouth 2 (two) times daily. 09/29/17   [provider]  nitroGLYCERIN (NITROSTAT) 0.4 MG SL tablet Place 1 tablet (0.4 mg total) under the tongue every 5 (five) minutes as needed for chest pain. 04/07/18   Glean Hess, MD  predniSONE (DELTASONE) 10 MG tablet Take 10 mg by mouth daily with breakfast.     [provider]      VITAL SIGNS:  Blood pressure 118/69, pulse 91, temperature 97.9 F (36.6 C), temperature source Oral, resp. rate 16, height 6' (1.829 m), weight 85.3 kg, SpO2 100 %.  PHYSICAL EXAMINATION:   Physical Exam Constitutional:      General: He is not in acute distress. HENT:     Head: Normocephalic.  Eyes:     General: No scleral icterus. Neck:     Musculoskeletal: Normal range of motion and neck supple.     Vascular: No JVD.     Trachea: No tracheal deviation.  Cardiovascular:     Rate and Rhythm: Normal rate and regular rhythm.     Heart sounds: Normal heart sounds. No murmur. No friction rub. No gallop.   Pulmonary:     Effort: Pulmonary effort is normal. No respiratory distress.     Breath sounds: Normal breath sounds. No wheezing or rales.  Chest:     Chest wall: No tenderness.  Abdominal:      General: Bowel sounds are normal. There is no distension.     Palpations: Abdomen is soft. There is no mass.     Tenderness: There is no abdominal tenderness. There is no guarding or rebound.  Musculoskeletal: Normal range of motion.  Skin:    General: Skin is warm.     Findings: No erythema or rash.  Neurological:     Mental Status: He is alert and oriented to person, place, and time.  Psychiatric:        Judgment: Judgment normal.       LABORATORY PANEL:   CBC Recent Labs  Lab 08/06/18 0856  WBC 5.9  HGB 5.8*  HCT 19.3*  PLT 302   ------------------------------------------------------------------------------------------------------------------  Chemistries  Recent Labs  Lab 08/06/18 1336  NA 141  K 3.9  CL 114*  CO2 21*  GLUCOSE 93  BUN 45*  CREATININE 2.17*  CALCIUM 8.5*   ------------------------------------------------------------------------------------------------------------------  Cardiac Enzymes No results for input(s): TROPONINI in the last 168 hours. ------------------------------------------------------------------------------------------------------------------  RADIOLOGY:  No results found.  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  1.  Symptomatic anemia with shortness of breath: Patient will receive 2 unit PRBC Oncology evaluation via epic Anemia panel ordered Continue B12  2.  Chronic kidney disease stage III: Oncology evaluation to restart Procrit  3.  BPH: Continue Proscar  4.  Essential hypertension: Continue hydralazine, Norvasc and lisinopril  5.  CAD: Continue aspirin, statin  6.  FSGS: Continue CellCept and prednisone   All the records are reviewed and case discussed with ED provider. Management plans discussed with the patient and he is in agreement  CODE STATUS: dnr  TOTAL TIME  TAKING CARE OF THIS PATIENT: 45 minutes.    Corrion Stirewalt M.D on 08/06/2018 at 3:15 PM  Between 7am to 6pm - Pager - [phone]  After 6pm go to [email or web address] - password [email or web address] [facility]  Office  [phone]  CC: Primary care physician; Glean Hess, MD

## 2018-08-06 NOTE — ED Notes (Signed)
Post transfusion vitals taken. No transfusion reaction noted. Pt resting comfortably.

## 2018-08-06 NOTE — ED Notes (Signed)
15 minute post transfusion vitals taken. No transfusion reactions noted. Pt resting comfortably.

## 2018-08-06 NOTE — Progress Notes (Signed)
Family Meeting Note  Advance Directive:yes  Today a meeting took place with the Patient.  The following clinical team members were present during this meeting:MD  The following were discussed:Patient's diagnosis acute on chronic anemia with chronic kidney disease stage III, Patient's progosis: Unable to determine and Goals for treatment: DNR  Additional follow-up to be provided: Advanced directives already created no need to update.  Time spent during discussion:16 minutes  Janavia Rottman, MD

## 2018-08-06 NOTE — ED Notes (Signed)
Consent for blood signed electronically.

## 2018-08-06 NOTE — ED Provider Notes (Signed)
[facility] Emergency Department Provider Note  ____________________________________________  Time seen: Approximately 3:11 PM  I have reviewed the triage vital signs and the nursing notes.   HISTORY  Chief Complaint Weakness    HPI Arthur Bowen is a 78 y.o. male with a history of BPH, hypertension, CKD who comes the ED complaining of fatigue and a hemoglobin of 5.4.  He used to get Procrit shots to keep his hemoglobin up around a baseline of 9, but has avoided this for the past several months because he felt like it was not helping him.  Denies chest pain or shortness of breath.  No syncope.  Symptoms are constant, no aggravating or alleviating factors although he does feel worse standing up.  He is tolerating oral intake.  Denies black or bloody stool.  No trauma.      Past Medical History:  Diagnosis Date  . Acquired phimosis   . BPH (benign prostatic hypertrophy)   . Chronic kidney disease   . Dyspnea    with exertion  . Elevated PSA   . Gross hematuria   . Hematuria   . History of hiatal hernia   . Hypertension   . 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 Renal mass, left      Patient Active Problem List   Diagnosis Date Noted  . B12 deficiency 08/06/2018  . Anemia 08/06/2018  . Anemia due to stage 4 chronic kidney disease ([place]) 07/24/2018  . Chronic kidney disease   . Bradycardia 06/05/2018  . Symptomatic anemia 11/21/2017  . Secondary hyperparathyroidism of renal origin ([place]) 10/15/2017  . CKD stage 4 secondary to hypertension ([place]) 10/09/2017  . Primary osteoarthritis of right knee 01/09/2017  . Elevated PSA 08/14/2015  . Renal mass 08/14/2015  . BPH with obstruction/lower urinary tract symptoms 08/14/2015  . History of hematuria 08/14/2015  . Coronary artery disease involving native coronary artery of native heart with angina pectoris ([place]) 04/06/2015  . Dyslipidemia 04/06/2015  . Essential (primary) hypertension 04/06/2015   . Neuropathy 04/06/2015  . Peripheral vascular disease ([place]) 04/06/2015  . 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 Senile purpura ([place]) 04/06/2015     Past Surgical History:  Procedure Laterality Date  . Circumscision  09/2015  . CORONARY ARTERY BYPASS GRAFT  2002   x3 @ [place]  . INGUINAL HERNIA REPAIR Left 2011  . RENAL BIOPSY  2014   neg in w/u renal mass/hematuria     Prior to Admission medications   Medication Sig Start Date End Date Taking? Authorizing Provider  amLODipine (NORVASC) 5 MG tablet Take 1 tablet (5 mg total) by mouth daily. 06/03/17   Glean Hess, MD  aspirin 81 MG tablet Take 1 tablet by mouth daily. Reported on 09/02/2015    [provider]  atorvastatin (LIPITOR) 40 MG tablet TAKE 1 TABLET BY MOUTH ONCE DAILY Patient taking differently: Take 20 mg by mouth daily at 6 PM.  11/24/17   Glean Hess, MD  calcitRIOL (ROCALTROL) 0.25 MCG capsule Take 0.25 mcg by mouth daily.    [provider]  Cyanocobalamin (VITAMIN B 12 PO) Take 1,000 mcg by mouth daily.     [provider]  finasteride (PROSCAR) 5 MG tablet TAKE 1 TABLET BY MOUTH ONCE DAILY 09/13/17   Glean Hess, MD  fluticasone [facility]) 50 MCG/ACT nasal spray Place 2 sprays into both nostrils daily. Patient taking differently: Place 2 sprays into both nostrils as needed.  10/24/15  Glean Hess, MD  hydrALAZINE (APRESOLINE) 25 MG tablet Take 2 tablets (50 mg total) by mouth 3 (three) times daily. 06/07/18   Salary, Avel Peace, MD  lisinopril (PRINIVIL,ZESTRIL) 20 MG tablet Take 20 mg by mouth daily.     [provider]  mycophenolate (CELLCEPT) 500 MG tablet Take 1,000 mg by mouth 2 (two) times daily. 09/29/17   [provider]  nitroGLYCERIN (NITROSTAT) 0.4 MG SL tablet Place 1 tablet (0.4 mg total) under the tongue every 5 (five) minutes as needed for chest pain. 04/07/18   Glean Hess, MD  predniSONE (DELTASONE) 10 MG tablet Take  10 mg by mouth daily with breakfast.     [provider]     Allergies Penicillins and Sulfa antibiotics   Family History  Problem Relation Age of Onset  . Lymphoma Mother   . Hodgkin's lymphoma Mother   . Lung cancer Father   . Heart disease Brother   . Dementia Sister   . Dementia Brother   . Breast cancer Maternal Aunt     Social History Social History   Tobacco Use  . Smoking status: Former Smoker    Packs/day: 0.50    Years: 50.00    Pack years: 25.00    Types: Cigarettes    Last attempt to quit: 10/10/2017    Years since quitting: 0.8  . Smokeless tobacco: Former Systems developer    Types: Chew  . Tobacco comment: last cig 10/10/17   Substance Use Topics  . Alcohol use: No    Alcohol/week: 0.0 standard drinks  . Drug use: No    Review of Systems  Constitutional:   No fever or chills.  ENT:   No sore throat. No rhinorrhea. Cardiovascular:   No chest pain or syncope. Respiratory:   No dyspnea or cough. Gastrointestinal:   Negative for abdominal pain, vomiting and diarrhea.  Musculoskeletal:   Negative for focal pain or swelling All other systems reviewed and are negative except as documented above in ROS and HPI.  ____________________________________________   PHYSICAL EXAM:  VITAL SIGNS: ED Triage Vitals [08/06/18 1219]  Enc Vitals Group     BP (!) 171/56     Pulse Rate 96     Resp 20     Temp 98.2 F (36.8 C)     Temp Source Oral     SpO2 100 %     Weight 188 lb (85.3 kg)     Height 6' (1.829 m)     Head Circumference      Peak Flow      Pain Score 0     Pain Loc      Pain Edu?      Excl. in [place]?     Vital signs reviewed, nursing assessments reviewed.   Constitutional:   Alert and oriented. Non-toxic appearance. Eyes:   Conjunctivae are normal. EOMI. PERRL. ENT      Head:   Normocephalic and atraumatic.      Nose:   No congestion/rhinnorhea.       Mouth/Throat:   Dry mucous membranes, no pharyngeal erythema. No peritonsillar mass.        Neck:   No meningismus. Full ROM. Hematological/Lymphatic/Immunilogical:   No cervical lymphadenopathy. Cardiovascular:   Tachycardia heart rate 110. Symmetric bilateral radial and DP pulses.  No murmurs. Cap refill less than 2 seconds. Respiratory:   Normal respiratory effort without tachypnea/retractions. Breath sounds are clear and equal bilaterally. No wheezes/rales/rhonchi. Gastrointestinal:   Soft and  nontender. Non distended. There is no CVA tenderness.  No rebound, rigidity, or guarding.  Hemoccult negative. Musculoskeletal:   Normal range of motion in all extremities. No joint effusions.  No lower extremity tenderness.  No edema. Neurologic:   Normal speech and language.  Motor grossly intact. No acute focal neurologic deficits are appreciated.  Skin:    Skin is warm, dry and intact. No rash noted.  No petechiae, purpura, or bullae.  ____________________________________________    LABS (pertinent positives/negatives) (all labs ordered are listed, but only abnormal results are displayed) Labs Reviewed  BASIC METABOLIC PANEL - Abnormal; Notable for the following components:      Result Value   Chloride 114 (*)    CO2 21 (*)    BUN 45 (*)    Creatinine, Ser 2.17 (*)    Calcium 8.5 (*)    GFR calc non Af Amer 28 (*)    GFR calc Af Amer 32 (*)    All other components within normal limits  PROTIME-INR  ABO/RH  PREPARE RBC (CROSSMATCH)  TYPE AND SCREEN   ____________________________________________   EKG    ____________________________________________    RADIOLOGY  No results found.  ____________________________________________   PROCEDURES .Critical Care Performed by: Carrie Mew, MD Authorized by: Carrie Mew, MD   Critical care provider statement:    Critical care time (minutes):  35   Critical care time was exclusive of:  Separately billable procedures and treating other patients   Critical care was necessary to treat or prevent imminent  or life-threatening deterioration of the following conditions:  Circulatory failure   Critical care was time spent personally by me on the following activities:  Development of treatment plan with patient or surrogate, discussions with consultants, evaluation of patient's response to treatment, examination of patient, obtaining history from patient or surrogate, ordering and performing treatments and interventions, ordering and review of laboratory studies, ordering and review of radiographic studies, pulse oximetry, re-evaluation of patient's condition and review of old charts    ____________________________________________    CLINICAL IMPRESSION / ASSESSMENT AND PLAN / ED COURSE  Medications ordered in the ED: Medications  0.9 %  sodium chloride infusion (10 mL/hr Intravenous New Bag/Given 08/06/18 1345)    Pertinent labs & imaging results that were available during my care of the patient were reviewed by me and considered in my medical decision making (see chart for details).    Patient presents with symptomatic anemia which is relatively severe with a hemoglobin of 5 with associated tachycardia due to cardiac compensation.  No signs of GI bleed or other acute blood loss this appears to be due to his CKD.  Patient agrees to blood transfusion as discussed by me including risks and benefits.  Ordered him 2 units of red blood cells.  Case discussed with hospitalist for further management.      ____________________________________________   FINAL CLINICAL IMPRESSION(S) / ED DIAGNOSES    Final diagnoses:  Chronic kidney disease, unspecified CKD stage  Symptomatic anemia     ED Discharge Orders    None      Portions of this note were generated with dragon dictation software. Dictation errors may occur despite best attempts at proofreading.   Carrie Mew, MD 08/06/18 [phone]

## 2018-08-07 LAB — BASIC METABOLIC PANEL
Anion gap: 7 (ref 5–15)
BUN: 44 mg/dL — ABNORMAL HIGH (ref 8–23)
CO2: 22 mmol/L (ref 22–32)
Calcium: 8.1 mg/dL — ABNORMAL LOW (ref 8.9–10.3)
Chloride: 115 mmol/L — ABNORMAL HIGH (ref 98–111)
Creatinine, Ser: 2.01 mg/dL — ABNORMAL HIGH (ref 0.61–1.24)
GFR calc Af Amer: 36 mL/min — ABNORMAL LOW (ref >60)
GFR calc non Af Amer: 31 mL/min — ABNORMAL LOW (ref >60)
Glucose, Bld: 98 mg/dL (ref 70–99)
Potassium: 4.2 mmol/L (ref 3.5–5.1)
Sodium: 144 mmol/L (ref 135–145)

## 2018-08-07 LAB — CBC
HCT: 22.2 % — ABNORMAL LOW (ref 39.0–52.0)
Hemoglobin: 6.9 g/dL — ABNORMAL LOW (ref 13.0–17.0)
MCH: 30.1 pg (ref 26.0–34.0)
MCHC: 31.1 g/dL (ref 30.0–36.0)
MCV: 96.9 fL (ref 80.0–100.0)
Platelets: 250 10*3/uL (ref 150–400)
RBC: 2.29 MIL/uL — ABNORMAL LOW (ref 4.22–5.81)
RDW: 17.2 % — ABNORMAL HIGH (ref 11.5–15.5)
WBC: 5.8 10*3/uL (ref 4.0–10.5)
nRBC: 0 % (ref 0.0–0.2)

## 2018-08-07 LAB — RETICULOCYTES
Immature Retic Fract: 17 % — ABNORMAL HIGH (ref 2.3–15.9)
RBC.: 2.56 MIL/uL — AB (ref 4.22–5.81)
Retic Count, Absolute: 41 10*3/uL (ref 19.0–186.0)
Retic Ct Pct: 1.6 % (ref 0.4–3.1)

## 2018-08-07 LAB — FOLATE: Folate: 7.5 ng/mL (ref >5.9)

## 2018-08-07 LAB — IRON AND TIBC
Iron: 99 ug/dL (ref 45–182)
Saturation Ratios: 40 % — ABNORMAL HIGH (ref 17.9–39.5)
TIBC: 249 ug/dL — ABNORMAL LOW (ref 250–450)
UIBC: 150 ug/dL

## 2018-08-07 LAB — INTRINSIC FACTOR ANTIBODIES: Intrinsic Factor: 1.1 AU/mL (ref 0.0–1.1)

## 2018-08-07 LAB — ANTI-PARIETAL ANTIBODY: Parietal Cell Antibody-IgG: 1.9 Units (ref 0.0–20.0)

## 2018-08-07 LAB — PREPARE RBC (CROSSMATCH)

## 2018-08-07 LAB — VITAMIN B12: Vitamin B-12: 629 pg/mL (ref 180–914)

## 2018-08-07 LAB — FERRITIN: Ferritin: 350 ng/mL — ABNORMAL HIGH (ref 24–336)

## 2018-08-07 MED ORDER — SODIUM CHLORIDE 0.9% IV SOLUTION: Freq: Once | INTRAVENOUS | Status: DC

## 2018-08-07 MED ORDER — MYCOPHENOLATE MOFETIL 250 MG PO CAPS
500.0000 mg | ORAL_CAPSULE | Freq: Two times a day (BID) | ORAL | Status: DC
  Filled 2018-08-07: qty 2

## 2018-08-07 NOTE — Consult Note (Signed)
[facility]  Date of admission:  08/06/2018  Inpatient day:  08/07/2018  Consulting physician: Dr Marisa Sprinkles  Reason for Consultation:  Anemia.  Chief Complaint: Arthur Bowen is a 78 y.o. male with anemia of chronic renal disease who was admitted through the ER with anemia.  HPI:  The patient was seen in the hematology clinic on 08/06/2018 for initial assessment.  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  He is on Cellcept.  He also has B12 deficiency.  He was admitted to [facility] from 06/05/2018 - 06/07/2018 secondary to symptomatic bradycardia (thought to due to beta-blocker) and symptomatic anemia.  Hemoglobin was6.4.  He received 2 units of PRBCs.  Work-up revealed a ferritin 247, iron saturation 33%, and normal folate. B12 was 311 (6177 on 10/24/2017).  He began B12 injections (last 3-4 weeks ago).  Review of Procrit administration revealed that his last Procrit was on 02/20/2018.  His daughter felt that there was no need for injections secondary to cost and that his counts (hemoglobin) would never get above 9.  He denied melena, hematochezia, or hematuria.  He has never had a colonoscopy.  He noted some "indigestion".  Hemoglobin returned 5.8.  He was referred to the ER.  Creatinine was 2.17.  Retic was 1.9%.  Ferritin was 281.    Labs today include an iron saturation 40% with a TIBC of 249. Creatinine 2.01.  Ferritin 350.  Folate 7.5.  He has received 2 units of PRBCs.   Past Medical History:  Diagnosis Date  . Acquired phimosis   . BPH (benign prostatic hypertrophy)   . Chronic kidney disease   . Dyspnea    with exertion  . Elevated PSA   . Gross hematuria   . Hematuria   . History of hiatal hernia   . Hypertension   . 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 Renal mass, left     Past Surgical History:  Procedure Laterality Date  . Circumscision  09/2015  . CORONARY ARTERY BYPASS GRAFT   2002   x3 @ [place]  . INGUINAL HERNIA REPAIR Left 2011  . RENAL BIOPSY  2014   neg in w/u renal mass/hematuria    Family History  Problem Relation Age of Onset  . Lymphoma Mother   . Hodgkin's lymphoma Mother   . Lung cancer Father   . Heart disease Brother   . Dementia Sister   . Dementia Brother   . Breast cancer Maternal Aunt     Social History:  reports that he quit smoking about 9 months ago. His smoking use included cigarettes. He has a 25.00 pack-year smoking history. He has quit using smokeless tobacco.  His smokeless tobacco use included chew. He reports that he does not drink alcohol or use drugs.  Allergies:  Allergies  Allergen Reactions  . Penicillins Other (See Comments)  . Sulfa Antibiotics Other (See Comments)    Medications Prior to Admission  Medication Sig Dispense Refill  . amLODipine (NORVASC) 5 MG tablet Take 1 tablet (5 mg total) by mouth daily. 90 tablet 3  . aspirin 81 MG tablet Take 1 tablet by mouth daily. Reported on 09/02/2015    . atorvastatin (LIPITOR) 40 MG tablet TAKE 1 TABLET BY MOUTH ONCE DAILY (Patient taking differently: Take 20 mg by mouth daily at 6 PM. ) 90 tablet 3  . calcitRIOL (ROCALTROL) 0.25 MCG capsule Take 0.25 mcg by mouth daily.    Marland Kitchen  Cyanocobalamin (VITAMIN B 12 PO) Take 1,000 mcg by mouth daily.     . finasteride (PROSCAR) 5 MG tablet TAKE 1 TABLET BY MOUTH ONCE DAILY 90 tablet 3  . hydrALAZINE (APRESOLINE) 50 MG tablet Take 50 mg by mouth 3 (three) times daily.    Marland Kitchen lisinopril (PRINIVIL,ZESTRIL) 20 MG tablet Take 20 mg by mouth daily.     . mycophenolate (CELLCEPT) 500 MG tablet Take 1,000 mg by mouth 2 (two) times daily.  3  . predniSONE (DELTASONE) 10 MG tablet Take 10 mg by mouth daily with breakfast.     . Cyanocobalamin (B-12) 1000 MCG/ML KIT Inject 1,000 mcg as directed every 30 (thirty) days. 1 kit 6  . fluticasone (FLONASE) 50 MCG/ACT nasal spray Place 2 sprays into both nostrils daily. (Patient taking differently:  Place 2 sprays into both nostrils as needed. ) 16 g 5  . nitroGLYCERIN (NITROSTAT) 0.4 MG SL tablet Place 1 tablet (0.4 mg total) under the tongue every 5 (five) minutes as needed for chest pain. 50 tablet 3    Review of Systems: (in clinic) GENERAL:  Feels good.  No fevers, sweats. PERFORMANCE STATUS (ECOG):  1 HEENT:  "cold symptoms".  No visual changes, runny nose, sore throat, mouth sores or tenderness. Lungs: No shortness of breath or cough.  No hemoptysis. Cardiac:  No chest pain, palpitations, orthopnea, or PND. GI:  Indigestion.  Diarrhea on Cellcept.  No nausea, vomiting, constipation, melena or hematochezia. GU:  No urgency, frequency, dysuria, or hematuria. Musculoskeletal:  No back pain.  No joint pain.  No muscle tenderness. Extremities:  No pain or swelling. Skin:  Bruises on aspirin.  No rashes or skin changes. Neuro:  No headache, numbness or weakness, balance or coordination issues. Endocrine:  No diabetes, thyroid issues, hot flashes or night sweats. Psych:  No mood changes, depression or anxiety. Pain:  No focal pain. Review of systems:  All other systems reviewed and found to be negative.  Physical Exam: (in clinic) Blood pressure (!) 144/73, pulse 92, temperature 98.3 F (36.8 C), temperature source Oral, resp. rate 18, height 6' (1.829 m), weight 188 lb (85.3 kg), SpO2 99 %.  GENERAL:  Well developed, well nourished, gentleman sitting comfortably on the medical unit in no acute distress. MENTAL STATUS:  Alert and oriented to person, place and time. HEAD:  Thin gray hair.  Male pattern baldness.  Normocephalic, atraumatic, face symmetric, no Cushingoid features. EYES:  Glasses.  Blue eyes.  Pupils equal round and reactive to light and accomodation.  No conjunctivitis or scleral icterus. ENT:  Oropharynx clear without lesion.  Tongue normal. Mucous membranes moist.  RESPIRATORY:  Clear to auscultation without rales, wheezes or rhonchi. CARDIOVASCULAR:  Regular rate  and rhythm without murmur, rub or gallop. ABDOMEN:  Soft, non-tender, with active bowel sounds, and no hepatosplenomegaly.  No masses. SKIN:  2 cm fleshy bleeding right sided back mole.  No rashes, ulcers or lesions. EXTREMITIES: No edema, no skin discoloration or tenderness.  No palpable cords. LYMPH NODES: No palpable cervical, supraclavicular, axillary or inguinal adenopathy  NEUROLOGICAL: Unremarkable. PSYCH:  Appropriate.   Results for orders placed or performed during the hospital encounter of 08/06/18 (from the past 48 hour(s))  ABO/Rh     Status: None   Collection Time: 08/06/18 12:24 PM  Result Value Ref Range   ABO/RH(D)      O POS Performed at [facility], [address], [place], [place] [address]   Basic metabolic panel  Status: Abnormal   Collection Time: 08/06/18  1:36 PM  Result Value Ref Range   Sodium 141 135 - 145 mmol/L   Potassium 3.9 3.5 - 5.1 mmol/L   Chloride 114 (H) 98 - 111 mmol/L   CO2 21 (L) 22 - 32 mmol/L   Glucose, Bld 93 70 - 99 mg/dL   BUN 45 (H) 8 - 23 mg/dL   Creatinine, Ser 2.17 (H) 0.61 - 1.24 mg/dL   Calcium 8.5 (L) 8.9 - 10.3 mg/dL   GFR calc non Af Amer 28 (L) >60 mL/min   GFR calc Af Amer 32 (L) >60 mL/min   Anion gap 6 5 - 15    Comment: Performed at [facility], [address] [place], [place] [address]  Protime-INR     Status: None   Collection Time: 08/06/18  1:36 PM  Result Value Ref Range   Prothrombin Time 12.5 11.4 - 15.2 seconds   INR 0.94     Comment: Performed at [facility], [address], [place], [place] [address]  Prepare RBC     Status: None   Collection Time: 08/06/18  1:46 PM  Result Value Ref Range   Order Confirmation      ORDER PROCESSED BY BLOOD BANK Performed at [facility], [address] [place], [place] [address]   Type and screen Ordered by PROVIDER DEFAULT     Status: None (Preliminary result)   Collection Time: 08/06/18  1:46 PM  Result Value Ref Range    ABO/RH(D) O POS    Antibody Screen NEG    Sample Expiration 08/09/2018    Unit Number [id number]    Blood Component Type RED CELLS,LR    Unit division 00    Status of Unit ISSUED    Transfusion Status OK TO TRANSFUSE    Crossmatch Result Compatible    Unit Number [id number]    Blood Component Type RED CELLS,LR    Unit division 00    Status of Unit ISSUED    Transfusion Status OK TO TRANSFUSE    Crossmatch Result      Compatible Performed at [facility], [address] [place], [place] [address]   Reticulocytes     Status: Abnormal   Collection Time: 08/07/18  4:49 AM  Result Value Ref Range   Retic Ct Pct 1.6 0.4 - 3.1 %   RBC. 2.56 (L) 4.22 - 5.81 MIL/uL   Retic Count, Absolute 41.0 19.0 - 186.0 K/uL   Immature Retic Fract 17.0 (H) 2.3 - 15.9 %    Comment: Performed at [facility], [address] [place], [place] [address]  Basic metabolic panel     Status: Abnormal   Collection Time: 08/07/18  4:49 AM  Result Value Ref Range   Sodium 144 135 - 145 mmol/L   Potassium 4.2 3.5 - 5.1 mmol/L   Chloride 115 (H) 98 - 111 mmol/L   CO2 22 22 - 32 mmol/L   Glucose, Bld 98 70 - 99 mg/dL   BUN 44 (H) 8 - 23 mg/dL   Creatinine, Ser 2.01 (H) 0.61 - 1.24 mg/dL   Calcium 8.1 (L) 8.9 - 10.3 mg/dL   GFR calc non Af Amer 31 (L) >60 mL/min   GFR calc Af Amer 36 (L) >60 mL/min   Anion gap 7 5 - 15    Comment: Performed at [facility], [address] [place], [place] [address]  CBC     Status:  Abnormal   Collection Time: 08/07/18  4:49 AM  Result Value Ref Range   WBC 2.7 (L) 4.0 - 10.5 K/uL   RBC 4.86 4.22 - 5.81 MIL/uL   Hemoglobin 14.5 13.0 - 17.0 g/dL   HCT 46.6 39.0 - 52.0 %   MCV 95.9 80.0 - 100.0 fL   MCH 29.8 26.0 - 34.0 pg   MCHC 31.1 30.0 - 36.0 g/dL   RDW 17.4 (H) 11.5 - 15.5 %   Platelets 147 (L) 150 - 400 K/uL   nRBC 0.0 0.0 - 0.2 %    Comment: Performed at [facility], [address], [place], [place] [address]    No results found.   Assessment:  The patient is a 78 y.o.  gentleman with anemia of chronic renal disease and B12 deficiency.  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   He last received Procrit on 02/20/2018.   He has a left renal mass s/p CT guided biopsy on 09/15/2012.  Pathology was negative.  Ultrasound of the kidney on 11/18/2017 revealed a stable 5.1 cm left kidney mass.  He is followed by Dr Erlene Quan.  He denies any hematuria.  He has B12 deficiency.  B12 was 311 on 06/05/2018.  He is on B12 injections (last 3-4 weeks ago at home)  He was admitted to [facility] from 06/05/2018 - 06/07/2018 secondary to symptomatic bradycardia (thought to due to beta-blocker) and symptomatic anemia.  Hemoglobin was6.4.  He received 2 units of PRBCs.  Work-up revealed a ferritin 247, iron saturation 33%, and normal folate. B12 was 311 (6177 on 10/24/2017).  He began B12 injections.  Symptomatically, he denies any shortness of breath or chest pain.  Hemoglobin is 14.5 post 2 units of PRBCs.  Plan: 1.  Acute normocytic anemia  Etiology felt secondary to anemia of chronic kidney disease.             Patient has not received Procrit since 02/20/2018.  He denies any melena, hematochezia, or hematuria.  Ferritin and iron stores are normal.  Folate is normal.  Retic is inappropriately low.  Guaiac all stools.  Doubt accuracy of CBC this AM as hemoglobin would have increased from 5.8 to 14.5 with 2 units.  Repeat CBC.  Spoke with Dr. Early Chars. 2.  B12 deficiency             Continue B12 injections monthly at home. 3.  Disposition  Anticipate follow-up in outpatient department if repeat hemoglobin adequate.  Thank you for allowing me to participate in Arthur Bowen 's care.  I will follow him closely with you while hospitalized and after discharge in the outpatient department.   Lequita Asal, MD  08/07/2018, 5:49 AM

## 2018-08-07 NOTE — Discharge Instructions (Signed)
Anemia  Anemia is a condition in which you do not have enough red blood cells or hemoglobin. Hemoglobin is a substance in red blood cells that carries oxygen. When you do not have enough red blood cells or hemoglobin (are anemic), your body cannot get enough oxygen and your organs may not work properly. As a result, you may feel very tired or have other problems. What are the causes? Common causes of anemia include:  Excessive bleeding. Anemia can be caused by excessive bleeding inside or outside the body, including bleeding from the intestine or from periods in women.  Poor nutrition.  Long-lasting (chronic) kidney, thyroid, and liver disease.  Bone marrow disorders.  Cancer and treatments for cancer.  HIV (human immunodeficiency virus) and AIDS (acquired immunodeficiency syndrome).  Treatments for HIV and AIDS.  Spleen problems.  Blood disorders.  Infections, medicines, and autoimmune disorders that destroy red blood cells. What are the signs or symptoms? Symptoms of this condition include:  Minor weakness.  Dizziness.  Headache.  Feeling heartbeats that are irregular or faster than normal (palpitations).  Shortness of breath, especially with exercise.  Paleness.  Cold sensitivity.  Indigestion.  Nausea.  Difficulty sleeping.  Difficulty concentrating. Symptoms may occur suddenly or develop slowly. If your anemia is mild, you may not have symptoms. How is this diagnosed? This condition is diagnosed based on:  Blood tests.  Your medical history.  A physical exam.  Bone marrow biopsy. Your health care provider may also check your stool (feces) for blood and may do additional testing to look for the cause of your bleeding. You may also have other tests, including:  Imaging tests, such as a CT scan or MRI.  Endoscopy.  Colonoscopy. How is this treated? Treatment for this condition depends on the cause. If you continue to lose a lot of blood, you may  need to be treated at a hospital. Treatment may include:  Taking supplements of iron, vitamin M08, or folic acid.  Taking a hormone medicine (erythropoietin) that can help to stimulate red blood cell growth.  Having a blood transfusion. This may be needed if you lose a lot of blood.  Making changes to your diet.  Having surgery to remove your spleen. Follow these instructions at home:  Take over-the-counter and prescription medicines only as told by your health care provider.  Take supplements only as told by your health care provider.  Follow any diet instructions that you were given.  Keep all follow-up visits as told by your health care provider. This is important. Contact a health care provider if:  You develop new bleeding anywhere in the body. Get help right away if:  You are very weak.  You are short of breath.  You have pain in your abdomen or chest.  You are dizzy or feel faint.  You have trouble concentrating.  You have bloody or black, tarry stools.  You vomit repeatedly or you vomit up blood. Summary  Anemia is a condition in which you do not have enough red blood cells or enough of a substance in your red blood cells that carries oxygen (hemoglobin).  Symptoms may occur suddenly or develop slowly.  If your anemia is mild, you may not have symptoms.  This condition is diagnosed with blood tests as well as a medical history and physical exam. Other tests may be needed.  Treatment for this condition depends on the cause of the anemia. This information is not intended to replace advice given to you by  your health care provider. Make sure you discuss any questions you have with your health care provider. Document Released: 07/12/2004 Document Revised: 07/06/2016 Document Reviewed: 07/06/2016 Elsevier Interactive Patient Education  2019 [place].    Blood Transfusion, Adult, Care After This sheet gives you information about how to care for yourself  after your procedure. Your doctor may also give you more specific instructions. If you have problems or questions, contact your doctor. Follow these instructions at home:   Take over-the-counter and prescription medicines only as told by your doctor.  Go back to your normal activities as told by your doctor.  Follow instructions from your doctor about how to take care of the area where an IV tube was put into your vein (insertion site). Make sure you: ? Wash your hands with soap and water before you change your bandage (dressing). If there is no soap and water, use hand sanitizer. ? Change your bandage as told by your doctor.  Check your IV insertion site every day for signs of infection. Check for: ? More redness, swelling, or pain. ? More fluid or blood. ? Warmth. ? Pus or a bad smell. Contact a doctor if:  You have more redness, swelling, or pain around the IV insertion site.  You have more fluid or blood coming from the IV insertion site.  Your IV insertion site feels warm to the touch.  You have pus or a bad smell coming from the IV insertion site.  Your pee (urine) turns pink, red, or brown.  You feel weak after doing your normal activities. Get help right away if:  You have signs of a serious allergic or body defense (immune) system reaction, including: ? Itchiness. ? Hives. ? Trouble breathing. ? Anxiety. ? Pain in your chest or lower back. ? Fever, flushing, and chills. ? Fast pulse. ? Rash. ? Watery poop (diarrhea). ? Throwing up (vomiting). ? Dark pee. ? Serious headache. ? Dizziness. ? Stiff neck. ? Yellow color in your face or the white parts of your eyes (jaundice). Summary  After a blood transfusion, return to your normal activities as told by your doctor.  Every day, check for signs of infection where the IV tube was put into your vein.  Some signs of infection are warm skin, more redness and pain, more fluid or blood, and pus or a bad smell where  the needle went in.  Contact your doctor if you feel weak or have any unusual symptoms. This information is not intended to replace advice given to you by your health care provider. Make sure you discuss any questions you have with your health care provider. Document Released: 06/25/2014 Document Revised: 01/27/2016 Document Reviewed: 01/27/2016 Elsevier Interactive Patient Education  [employer].

## 2018-08-07 NOTE — Progress Notes (Addendum)
Pnt has a half dollar sized, red, asymmetrical, circular growth raised about a 1/4-1/2 inch off his back. The growth is draining/oozing purulent drainage. Pnt said growth has been there for only a couple weeks and was supposed to get it looked at but hasn't yet. Cleaned and dressed in foam allevyn. Will report to day nurse to communicate to MD in am.  Pnt also has a mild rash/irritation to bilateral feet. Applied moisturizer to see if it would help. Pnt denies pain to both areas.   Pnt did refuse bed alarm and to call for standby assistance to bathroom. Educated on importance of safety but pnt still refused. Pnt does have a steady gait however is here for weakness due to decreased hemoglobin.He did agree to use walker when going into bathroom.  Pnt does report he is feeling better this am. Pnt also refused full skin assessment as pnt wouldn't allow me to assess under pants neither in front of backside. Asked pnt if he would let anyone else assess him and he said no and that he has nothing wrong with him down there.

## 2018-08-07 NOTE — Progress Notes (Signed)
[place], [place] 08/07/18  Subjective:   Patient known to our practice from outpatient follow-up.  He sees Dr. Holley Raring for FSGS and is being treated with CellCept.  He also has history of anemia of chronic kidney disease and B12 deficiency.  In the oncologist office he was noted to have acute drop in hemoglobin therefore sent to the ER for blood transfusion where he was given 2 units.  He feels well at present.  Able to eat without nausea or vomiting.  Review of labs show WBC count down to 2.7  Objective:  Vital signs in last 24 hours:  Temp:  [97.7 F (36.5 C)-98.6 F (37 C)] 97.7 F (36.5 C) (02/20 1455) Pulse Rate:  [84-100] 100 (02/20 1455) Resp:  [17-23] 18 (02/20 1455) BP: (123-183)/(51-79) 173/74 (02/20 1455) SpO2:  [97 %-100 %] 98 % (02/20 1455)  Weight change:  Filed Weights   08/06/18 1219  Weight: 85.3 kg    Intake/Output:    Intake/Output Summary (Last 24 hours) at 08/07/2018 1523 Last data filed at 08/07/2018 1430 Gross per 24 hour  Intake 1632 ml  Output 200 ml  Net 1432 ml     Physical Exam: General:  No acute distress, laying in the bed  HEENT  moist oral mucous membranes  Neck  supple, no masses  Pulm/lungs  normal breathing effort, clear to auscultation  CVS/Heart  regular, no rub  Abdomen:   Soft, nontender  Extremities:  No edema  Neurologic:  Alert, oriented  Skin:  Lesion on the back covered with bandage    Basic Metabolic Panel:  Recent Labs  Lab 08/06/18 1336 08/07/18 0449  NA 141 144  K 3.9 4.2  CL 114* 115*  CO2 21* 22  GLUCOSE 93 98  BUN 45* 44*  CREATININE 2.17* 2.01*  CALCIUM 8.5* 8.1*     CBC: Recent Labs  Lab 08/06/18 0856 08/07/18 0449 08/07/18 0944  WBC 5.9 2.7* 5.8  NEUTROABS 4.2  --   --   HGB 5.8* 14.5 6.9*  HCT 19.3* 46.6 22.2*  MCV 99.5 95.9 96.9  PLT 302 147* 250     No results found for: HEPBSAG, HEPBSAB, HEPBIGM    Microbiology:  No results found for this or any  previous visit (from the past 240 hour(s)).  Coagulation Studies: Recent Labs    08/06/18 1336  LABPROT 12.5  INR 0.94    Urinalysis: No results for input(s): COLORURINE, LABSPEC, PHURINE, GLUCOSEU, HGBUR, BILIRUBINUR, KETONESUR, PROTEINUR, UROBILINOGEN, NITRITE, LEUKOCYTESUR in the last 72 hours.  Invalid input(s): APPERANCEUR    Imaging: No results found.   Medications:    . sodium chloride   Intravenous Once  . amLODipine  5 mg Oral Daily  . atorvastatin  20 mg Oral q1800  . calcitRIOL  0.25 mcg Oral Daily  . finasteride  5 mg Oral Daily  . hydrALAZINE  50 mg Oral TID  . lisinopril  20 mg Oral Daily  . mycophenolate  500 mg Oral BID  . predniSONE  10 mg Oral Q breakfast  . vitamin B-12  1,000 mcg Oral Daily   acetaminophen **OR** acetaminophen, nitroGLYCERIN, ondansetron **OR** ondansetron (ZOFRAN) IV, polyethylene glycol  Assessment/ Plan:  78 y.o.caucsian male coronary artery disease status post CABG, hypertension, peripheral vascular disease, peripheral neuropathy, BPH, hyperlipidemia, history of left renal mass which most likely represents a proteinaceous cyst  Chronic kidney disease stage III FSGS Left renal mass Anemia of chronic kidney disease  Serum creatinine is  close to baseline.  Outpatient creatinine of 2.09/GFR 29 in December 2019 Current creatinine 2.01/GFR 31 Due to low WBC count, reduce the dose of CellCept to 500 twice a day Red blood transfusion given this admission. Continue to follow-up with cancer center for Procrit therapy. Follow-up as outpatient    LOS: [place] 2/20/20203:23 [facility] [place], [place]  Note: This note was prepared with Dragon dictation. Any transcription errors are unintentional

## 2018-08-07 NOTE — Consult Note (Signed)
[facility] Nurse wound consult note Reason for Consult: hypergranulation tissue on back Wound type: original etiology of wound not know, but it has healed above skin level (hypergranulated) Pressure Injury POA:NA Measurement:3.5cm x 2.5cm with elevation of 0.6cm Wound bed: red, very moist and friable Drainage (amount, consistency, odor) serous to serosanguinous Periwound:intact Dressing procedure/placement/frequency: Patient with appointment to see dermatologist on 3/1.  In the meantime, we will implement measures to dry tissue using a calcium alginate dressing topped with foam. I have provided supplies for the two weeks he will need prior to Dermatology appointment.  Dermatology may elect to use chemical cautery (silver nitrate stick application) to area or remove.  [facility] nursing team will not follow, but will remain available to this patient, the nursing and medical teams.  Please re-consult if needed. Thanks, Maudie Flakes, MSN, RN, [place], [facility]  Pager# [phone]

## 2018-08-07 NOTE — ED Notes (Signed)
ED TO INPATIENT HANDOFF REPORT  Name/Age/Gender Arthur Bowen 78 y.o. male  Code Status    Code Status Orders  (From admission, onward)         Start     Ordered   08/06/18 1524  Do not attempt resuscitation (DNR)  Continuous    Question Answer Comment  In the event of cardiac or respiratory ARREST Do not call a "code blue"   In the event of cardiac or respiratory ARREST Do not perform Intubation, CPR, defibrillation or ACLS   In the event of cardiac or respiratory ARREST Use medication by any route, position, wound care, and other measures to relive pain and suffering. May use oxygen, suction and manual treatment of airway obstruction as needed for comfort.      08/06/18 1524        Code Status History    Date Active Date Inactive Code Status Order ID Comments User Context   06/05/2018 1420 06/07/2018 1553 Full Code [id number]  Dustin Flock, MD Inpatient   07/31/2017 1018 08/01/2017 1319 Full Code [id number]  Vaughan Basta, MD Inpatient    Advance Directive Documentation     Most Recent Value  Type of Advance Directive  Healthcare Power of Attorney  Pre-existing out of facility DNR order (yellow form or pink MOST form)  -  "MOST" Form in Place?  -      Home/SNF/Other Home  Chief Complaint sent by dr blood transfusion  Level of Care/Admitting Diagnosis ED Disposition    ED Disposition Condition [place]: [facility] [[id number]]  Level of Care: Med-Surg [16]  Diagnosis: Anemia [956213]  Admitting Physician: Bettey Costa [[id number]]  Attending Physician: MODY, Ulice Bold [[id number]]  Estimated length of stay: past midnight tomorrow  Certification:: I certify this patient will need inpatient services for at least 2 midnights  PT Class (Do Not Modify): Inpatient [101]  PT Acc Code (Do Not Modify): Private [1]       Medical History Past Medical History:  Diagnosis Date  . Acquired phimosis   . BPH (benign prostatic  hypertrophy)   . Chronic kidney disease   . Dyspnea    with exertion  . Elevated PSA   . Gross hematuria   . Hematuria   . History of hiatal hernia   . Hypertension   . 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 Renal mass, left     Allergies Allergies  Allergen Reactions  . Penicillins Other (See Comments)  . Sulfa Antibiotics Other (See Comments)    IV Location/Drains/Wounds Patient Lines/Drains/Airways Status   Active Line/Drains/Airways    Name:   Placement date:   Placement time:   Site:   Days:   Peripheral IV 08/06/18 Right Antecubital   08/06/18    1300    Antecubital   1          Labs/Imaging Results for orders placed or performed during the hospital encounter of 08/06/18 (from the past 48 hour(s))  ABO/Rh     Status: None   Collection Time: 08/06/18 12:24 PM  Result Value Ref Range   ABO/RH(D)      O POS Performed at [facility], [address] [place], [place] [address]   Basic metabolic panel     Status: Abnormal   Collection Time: 08/06/18  1:36 PM  Result Value Ref Range   Sodium 141 135 - 145 mmol/L   Potassium 3.9 3.5 - 5.1 mmol/L  Chloride 114 (H) 98 - 111 mmol/L   CO2 21 (L) 22 - 32 mmol/L   Glucose, Bld 93 70 - 99 mg/dL   BUN 45 (H) 8 - 23 mg/dL   Creatinine, Ser 2.17 (H) 0.61 - 1.24 mg/dL   Calcium 8.5 (L) 8.9 - 10.3 mg/dL   GFR calc non Af Amer 28 (L) >60 mL/min   GFR calc Af Amer 32 (L) >60 mL/min   Anion gap 6 5 - 15    Comment: Performed at [facility], [address] [place], [place] [address]  Protime-INR     Status: None   Collection Time: 08/06/18  1:36 PM  Result Value Ref Range   Prothrombin Time 12.5 11.4 - 15.2 seconds   INR 0.94     Comment: Performed at [facility], [address], [place], [place] [address]  Prepare RBC     Status: None   Collection Time: 08/06/18  1:46 PM  Result Value Ref Range   Order Confirmation      ORDER PROCESSED BY BLOOD BANK Performed at [facility], [address] [place], [place] [address]   Type and screen Ordered by PROVIDER DEFAULT     Status: None (Preliminary result)   Collection Time: 08/06/18  1:46 PM  Result Value Ref Range   ABO/RH(D) O POS    Antibody Screen NEG    Sample Expiration 08/09/2018    Unit Number [id number]    Blood Component Type RED CELLS,LR    Unit division 00    Status of Unit ISSUED    Transfusion Status OK TO TRANSFUSE    Crossmatch Result Compatible    Unit Number [id number]    Blood Component Type RED CELLS,LR    Unit division 00    Status of Unit ISSUED    Transfusion Status OK TO TRANSFUSE    Crossmatch Result      Compatible Performed at [facility], [address] [place], [place] [address]    No results found.  Pending Labs Unresulted Labs (From admission, onward)    Start     Ordered   08/07/18 2376  Basic metabolic panel  Tomorrow morning,   STAT     08/06/18 1929   08/07/18 0500  CBC  Tomorrow morning,   STAT     08/06/18 1929   08/06/18 1930  Vitamin B12  (Anemia Panel (PNL))  Once,   STAT     08/06/18 1929   08/06/18 1930  Folate  (Anemia Panel (PNL))  Once,   STAT     08/06/18 1929   08/06/18 1930  Iron and TIBC  (Anemia Panel (PNL))  Once,   STAT     08/06/18 1929   08/06/18 1930  Ferritin  (Anemia Panel (PNL))  Once,   STAT     08/06/18 1929   08/06/18 1930  Reticulocytes  (Anemia Panel (PNL))  Once,   STAT     08/06/18 1929          Vitals/Pain Today's Vitals   08/06/18 2330 08/06/18 2344 08/06/18 2351 08/07/18 0151  BP: (!) 123/51  (!) 158/72 (!) 159/75  Pulse: 86  91 92  Resp: 20  19 20   Temp:   98.2 F (36.8 C)   TempSrc:   Oral   SpO2: 98%  98% 99%  Weight:      Height:      PainSc:  0-No pain  0-No pain  Isolation Precautions No active isolations  Medications Medications  amLODipine (NORVASC) tablet 5 mg (5 mg Oral Given 08/06/18 2035)  finasteride (PROSCAR) tablet 5 mg (has no administration in time range)   calcitRIOL (ROCALTROL) capsule 0.25 mcg (has no administration in time range)  mycophenolate (CELLCEPT) tablet 1,000 mg (has no administration in time range)  atorvastatin (LIPITOR) tablet 20 mg (has no administration in time range)  nitroGLYCERIN (NITROSTAT) SL tablet 0.4 mg (has no administration in time range)  hydrALAZINE (APRESOLINE) tablet 50 mg (has no administration in time range)  lisinopril (PRINIVIL,ZESTRIL) tablet 20 mg (20 mg Oral Given 08/06/18 2035)  predniSONE (DELTASONE) tablet 10 mg (has no administration in time range)  Vitamin B 12 TABS 1,000 mcg (has no administration in time range)  acetaminophen (TYLENOL) tablet 650 mg (has no administration in time range)    Or  acetaminophen (TYLENOL) suppository 650 mg (has no administration in time range)  polyethylene glycol (MIRALAX / GLYCOLAX) packet 17 g (has no administration in time range)  ondansetron (ZOFRAN) tablet 4 mg (has no administration in time range)    Or  ondansetron (ZOFRAN) injection 4 mg (has no administration in time range)  hydrALAZINE (APRESOLINE) tablet 50 mg (50 mg Oral Given 08/06/18 1640)  0.9 %  sodium chloride infusion (10 mL/hr Intravenous New Bag/Given 08/06/18 1345)    Mobility walks with device

## 2018-08-07 NOTE — Evaluation (Signed)
Physical Therapy Evaluation Patient Details Name: Arthur Bowen MRN: [id number] DOB: 01/31/1941 Today's Date: 08/07/2018   History of Present Illness  78 y.o. 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   Clinical Impression  Pt did relatively well with ambulation and overall showed good balance, confidence and safety.  He had some fatigued with prolonged walker and stair negotiation, but ultimately reports not feeling too far from his normal and confident about being able to go home w/o assist.  He had 2 units of blood (H&H now WNL) and does not feel particularly limited, weak, etc.  No further PT needs, safe to d/c once medically cleared.    Follow Up Recommendations No PT follow up    Equipment Recommendations  None recommended by PT    Recommendations for Other Services       Precautions / Restrictions Precautions Precautions: Fall Restrictions Weight Bearing Restrictions: No      Mobility  Bed Mobility Overal bed mobility: Independent                Transfers Overall transfer level: Independent               General transfer comment: Pt able to rise w/o assist, showed good confidence  Ambulation/Gait Ambulation/Gait assistance: Modified independent (Device/Increase time) Gait Distance (Feet): 175 Feet Assistive device: None       General Gait Details: Pt able to walk with good speed and confidence, no LOBs but slight fatigue with vitals remaining reasonable but with increased HR to 110s and O2 to low 90s.  Stairs Stairs: Yes Stairs assistance: Modified independent (Device/Increase time) Stair Management: One rail Right Number of Stairs: 9 General stair comments: Pt negotiates up/down steps with good confidence, safety and efficiency  Wheelchair Mobility    Modified Rankin (Stroke  Patients Only)       Balance Overall balance assessment: Modified Independent                                           Pertinent Vitals/Pain Pain Assessment: No/denies pain    Home Living Family/patient expects to be discharged to:: Private residence Living Arrangements: Children Available Help at Discharge: Family   Home Access: Stairs to enter Entrance Stairs-Rails: Right Entrance Stairs-Number of Steps: 9          Prior Function Level of Independence: Independent         Comments: Pt reports that he does not drive, but does get out of the home regularly     Hand Dominance        Extremity/Trunk Assessment   Upper Extremity Assessment Upper Extremity Assessment: Generalized weakness(age appropriate)    Lower Extremity Assessment Lower Extremity Assessment: Generalized weakness;Overall WFL for tasks assessed       Communication   Communication: No difficulties  Cognition Arousal/Alertness: Awake/alert Behavior During Therapy: WFL for tasks assessed/performed Overall Cognitive Status: Within Functional Limits for tasks assessed                                        General Comments      Exercises  Assessment/Plan    PT Assessment Patent does not need any further PT services  PT Problem List         PT Treatment Interventions      PT Goals (Current goals can be found in the Care Plan section)  Acute Rehab PT Goals Patient Stated Goal: go home PT Goal Formulation: All assessment and education complete, DC therapy    Frequency     Barriers to discharge        Co-evaluation               AM-PAC PT "6 Clicks" Mobility  Outcome Measure Help needed turning from your back to your side while in a flat bed without using bedrails?: None Help needed moving from lying on your back to sitting on the side of a flat bed without using bedrails?: None Help needed moving to and from a bed to a chair  (including a wheelchair)?: None Help needed standing up from a chair using your arms (e.g., wheelchair or bedside chair)?: None Help needed to walk in hospital room?: None Help needed climbing 3-5 steps with a railing? : None 6 Click Score: 24    End of Session Equipment Utilized During Treatment: Gait belt Activity Tolerance: Patient tolerated treatment well Patient left: with call bell/phone within reach;with chair alarm set Nurse Communication: Mobility status PT Visit Diagnosis: Muscle weakness (generalized) (M62.81);Difficulty in walking, not elsewhere classified (R26.2)    Time: 0092-3300 PT Time Calculation (min) (ACUTE ONLY): 17 min   Charges:   PT Evaluation $PT Eval Low Complexity: 1 Low          Kreg Shropshire, DPT 08/07/2018, 10:34 AM

## 2018-08-07 NOTE — Progress Notes (Signed)
Discussion with patient's daughter about wound care and extra dressings sent, per wound care RN. IV removed with cath intact. No follow-up blood work needed after unit of blood completed, per MD.  BP is evaluated , patient unwilling to wait to notify MD. Patient stated "I'm going now no matter what." Daughter had stated that patient gets anxious when he wants something. She stated "he will be fine once I get him into the car". No new meds or scripts.

## 2018-08-08 ENCOUNTER — Telehealth: Payer: Self-pay

## 2018-08-08 ENCOUNTER — Inpatient Hospital Stay: Payer: Medicare Other

## 2018-08-08 ENCOUNTER — Other Ambulatory Visit: Payer: Self-pay | Admitting: Urgent Care

## 2018-08-08 ENCOUNTER — Other Ambulatory Visit: Payer: Self-pay

## 2018-08-08 VITALS — BP 147/63 | HR 74 | Temp 97.0°F | Resp 18

## 2018-08-08 DIAGNOSIS — N184 Chronic kidney disease, stage 4 (severe): Principal | ICD-10-CM

## 2018-08-08 DIAGNOSIS — D631 Anemia in chronic kidney disease: Secondary | ICD-10-CM | POA: Diagnosis not present

## 2018-08-08 DIAGNOSIS — Z87891 Personal history of nicotine dependence: Secondary | ICD-10-CM | POA: Diagnosis not present

## 2018-08-08 DIAGNOSIS — N2889 Other specified disorders of kidney and ureter: Secondary | ICD-10-CM

## 2018-08-08 DIAGNOSIS — Z803 Family history of malignant neoplasm of breast: Secondary | ICD-10-CM | POA: Diagnosis not present

## 2018-08-08 DIAGNOSIS — Z807 Family history of other malignant neoplasms of lymphoid, hematopoietic and related tissues: Secondary | ICD-10-CM | POA: Diagnosis not present

## 2018-08-08 DIAGNOSIS — I129 Hypertensive chronic kidney disease with stage 1 through stage 4 chronic kidney disease, or unspecified chronic kidney disease: Secondary | ICD-10-CM | POA: Diagnosis not present

## 2018-08-08 DIAGNOSIS — D225 Melanocytic nevi of trunk: Secondary | ICD-10-CM | POA: Diagnosis not present

## 2018-08-08 DIAGNOSIS — E538 Deficiency of other specified B group vitamins: Secondary | ICD-10-CM

## 2018-08-08 DIAGNOSIS — D649 Anemia, unspecified: Secondary | ICD-10-CM

## 2018-08-08 DIAGNOSIS — Z801 Family history of malignant neoplasm of trachea, bronchus and lung: Secondary | ICD-10-CM | POA: Diagnosis not present

## 2018-08-08 LAB — CBC WITH DIFFERENTIAL/PLATELET
Abs Immature Granulocytes: 0.03 10*3/uL (ref 0.00–0.07)
Basophils Absolute: 0 10*3/uL (ref 0.0–0.1)
Basophils Relative: 0 %
Eosinophils Absolute: 0.1 10*3/uL (ref 0.0–0.5)
Eosinophils Relative: 2 %
HCT: 29.5 % — ABNORMAL LOW (ref 39.0–52.0)
Hemoglobin: 9.5 g/dL — ABNORMAL LOW (ref 13.0–17.0)
Immature Granulocytes: 1 %
Lymphocytes Relative: 21 %
Lymphs Abs: 1.2 10*3/uL (ref 0.7–4.0)
MCH: 30.6 pg (ref 26.0–34.0)
MCHC: 32.2 g/dL (ref 30.0–36.0)
MCV: 95.2 fL (ref 80.0–100.0)
Monocytes Absolute: 0.5 10*3/uL (ref 0.1–1.0)
Monocytes Relative: 8 %
Neutro Abs: 3.8 10*3/uL (ref 1.7–7.7)
Neutrophils Relative %: 68 %
Platelets: 275 10*3/uL (ref 150–400)
RBC: 3.1 MIL/uL — ABNORMAL LOW (ref 4.22–5.81)
RDW: 17.1 % — ABNORMAL HIGH (ref 11.5–15.5)
WBC: 5.5 10*3/uL (ref 4.0–10.5)
nRBC: 0 % (ref 0.0–0.2)

## 2018-08-08 LAB — TYPE AND SCREEN
ABO/RH(D): O POS
Antibody Screen: NEGATIVE
Unit division: 0
Unit division: 0
Unit division: 0

## 2018-08-08 LAB — BPAM RBC
BLOOD PRODUCT EXPIRATION DATE: 202003122359
Blood Product Expiration Date: 202003122359
Blood Product Expiration Date: 202003182359
ISSUE DATE / TIME: 202002191545
ISSUE DATE / TIME: 202002192307
ISSUE DATE / TIME: 202002201124
UNIT TYPE AND RH: 5100
UNIT TYPE AND RH: 5100
Unit Type and Rh: 5100

## 2018-08-08 LAB — COMPREHENSIVE METABOLIC PANEL
ALT: 18 U/L (ref 0–44)
AST: 17 U/L (ref 15–41)
Albumin: 2.8 g/dL — ABNORMAL LOW (ref 3.5–5.0)
Alkaline Phosphatase: 64 U/L (ref 38–126)
Anion gap: 9 (ref 5–15)
BUN: 38 mg/dL — ABNORMAL HIGH (ref 8–23)
CO2: 23 mmol/L (ref 22–32)
Calcium: 8 mg/dL — ABNORMAL LOW (ref 8.9–10.3)
Chloride: 110 mmol/L (ref 98–111)
Creatinine, Ser: 2 mg/dL — ABNORMAL HIGH (ref 0.61–1.24)
GFR calc Af Amer: 36 mL/min — ABNORMAL LOW (ref >60)
GFR calc non Af Amer: 31 mL/min — ABNORMAL LOW (ref >60)
Glucose, Bld: 112 mg/dL — ABNORMAL HIGH (ref 70–99)
Potassium: 3.7 mmol/L (ref 3.5–5.1)
Sodium: 142 mmol/L (ref 135–145)
Total Bilirubin: 0.4 mg/dL (ref 0.3–1.2)
Total Protein: 4.9 g/dL — ABNORMAL LOW (ref 6.5–8.1)

## 2018-08-08 LAB — RETICULOCYTES
Immature Retic Fract: 17.1 % — ABNORMAL HIGH (ref 2.3–15.9)
RBC.: 3.16 MIL/uL — ABNORMAL LOW (ref 4.22–5.81)
Retic Count, Absolute: 46.1 10*3/uL (ref 19.0–186.0)
Retic Ct Pct: 1.5 % (ref 0.4–3.1)

## 2018-08-08 MED ORDER — EPOETIN ALFA-EPBX 40000 UNIT/ML IJ SOLN: 50000.0000 [IU] | Freq: Once | INTRAMUSCULAR | Status: DC

## 2018-08-08 MED ORDER — EPOETIN ALFA-EPBX 10000 UNIT/ML IJ SOLN
10000.0000 [IU] | Freq: Once | INTRAMUSCULAR | Status: AC
  Administered 2018-08-08: 10000 [IU] via SUBCUTANEOUS
  Filled 2018-08-08: qty 1

## 2018-08-08 MED ORDER — EPOETIN ALFA-EPBX 40000 UNIT/ML IJ SOLN
40000.0000 [IU] | Freq: Once | INTRAMUSCULAR | Status: AC
  Administered 2018-08-08: 40000 [IU] via SUBCUTANEOUS
  Filled 2018-08-08: qty 1

## 2018-08-08 NOTE — Telephone Encounter (Signed)
Unable to contact the patient, will continue to try and get in contact with the patient.

## 2018-08-08 NOTE — Telephone Encounter (Signed)
First attempt to reach patient for TCM call and confirm hospital follow up appt

## 2018-08-08 NOTE — Telephone Encounter (Signed)
-----   Message from Lequita Asal, MD sent at 08/08/2018  7:38 AM EST ----- Regarding: No one called back last night  Procrit- can you find out if it was given in the hospital?  Still significantly anemic.  Can we set up 1 unit for today?  M

## 2018-08-09 NOTE — Discharge Summary (Signed)
[place] [facility] [place] NAME: Arthur Bowen    MR#:  [id number]  DATE OF BIRTH:  Jul 12, 1940  DATE OF ADMISSION:  08/06/2018   ADMITTING PHYSICIAN: Bettey Costa, MD  DATE OF DISCHARGE: 08/07/2018  3:48 PM  PRIMARY CARE PHYSICIAN: Glean Hess, MD   ADMISSION DIAGNOSIS:  Symptomatic anemia [D64.9] Chronic kidney disease, unspecified CKD stage [N18.9] DISCHARGE DIAGNOSIS:  Active Problems:   Anemia  SECONDARY DIAGNOSIS:   Past Medical History:  Diagnosis Date  . Acquired phimosis   . BPH (benign prostatic hypertrophy)   . Chronic kidney disease   . Dyspnea    with exertion  . Elevated PSA   . Gross hematuria   . Hematuria   . History of hiatal hernia   . Hypertension   . 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 Renal mass, left    HOSPITAL COURSE:  78 y.o. 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 He received 3 units ofPRBCs while inpt. Work-up revealed aferritin 247, iron saturation 33%,andnormal folate.B12was 311 (6177on 10/24/2017). He began B12 injections (last 3-4 weeks ago) as an outpt. He was asymptomatic while in the Hospital and in fact felt much better after 3 prbc. He was desparate in wanting to leave after transfusion. As he didn't have any obvious source of bleeding and per my D/W Onco Dr Mike Gip - they will follow up with him at cancer center he was D/Ced with outpt f/up at cancer center requested. He didn't receive procrit in the Hospital. Continue to follow-up with cancer center  for Procrit therapy.  * Hypergranulation tissue on back - Present on admission. Patient with appointment to see dermatologist on 3/1.  it has healed above skin level (hypergranulated) Measurement:3.5cm x 2.5cm with elevation of 0.6cm Wound bed: red, very moist and friable Drainage (amount, consistency, odor) serous to serosanguinous Periwound:intact Dressing procedure/placement/frequency per wound care nurse: implement measures to dry tissue using a calcium alginate dressing topped with foam. Nurse has provided supplies for the two weeks he will need prior to Dermatology appointment.  Dermatology may elect to use chemical cautery (silver nitrate stick application) to area or remove.  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  Outpatient creatinine of 2.09/GFR 29 in December 2019 Current creatinine 2.01/GFR 31 Due to low WBC count, reduced the dose of CellCept to 500 twice a day DISCHARGE CONDITIONS:  stable CONSULTS OBTAINED:   DRUG ALLERGIES:   Allergies  Allergen Reactions  . Penicillins Other (See Comments)  . Sulfa Antibiotics Other (See Comments)   DISCHARGE MEDICATIONS:   Allergies as of 08/07/2018      Reactions   Penicillins Other (See Comments)   Sulfa Antibiotics Other (See Comments)      Medication List    TAKE these medications   amLODipine 5 MG tablet Commonly known as:  NORVASC Take 1 tablet (5 mg total) by mouth daily.   aspirin 81 MG tablet Take 1 tablet by mouth daily. Reported on 09/02/2015  atorvastatin 40 MG tablet Commonly known as:  LIPITOR TAKE 1 TABLET BY MOUTH ONCE DAILY What changed:    how much to take  when to take this   calcitRIOL 0.25 MCG capsule Commonly known as:  ROCALTROL Take 0.25 mcg by mouth daily.   finasteride 5 MG tablet Commonly known as:  PROSCAR TAKE 1 TABLET BY MOUTH ONCE DAILY   fluticasone 50 MCG/ACT nasal spray Commonly known as:  FLONASE Place 2  sprays into both nostrils daily. What changed:    when to take this  reasons to take this   hydrALAZINE 50 MG tablet Commonly known as:  APRESOLINE Take 50 mg by mouth 3 (three) times daily.   lisinopril 20 MG tablet Commonly known as:  PRINIVIL,ZESTRIL Take 20 mg by mouth daily.   mycophenolate 500 MG tablet Commonly known as:  CELLCEPT Take 1,000 mg by mouth 2 (two) times daily.   nitroGLYCERIN 0.4 MG SL tablet Commonly known as:  NITROSTAT Place 1 tablet (0.4 mg total) under the tongue every 5 (five) minutes as needed for chest pain.   predniSONE 10 MG tablet Commonly known as:  DELTASONE Take 10 mg by mouth daily with breakfast.   VITAMIN B 12 PO Take 1,000 mcg by mouth daily. What changed:  Another medication with the same name was added. Make sure you understand how and when to take each.   B-12 1000 MCG/ML Kit Inject 1,000 mcg as directed every 30 (thirty) days. What changed:  You were already taking a medication with the same name, and this prescription was added. Make sure you understand how and when to take each.        DISCHARGE INSTRUCTIONS:   DIET:  Regular diet DISCHARGE CONDITION:  Good ACTIVITY:  Activity as tolerated OXYGEN:  Home Oxygen: No.  Oxygen Delivery: room air DISCHARGE LOCATION:  home   If you experience worsening of your admission symptoms, develop shortness of breath, life threatening emergency, suicidal or homicidal thoughts you must seek medical attention immediately by calling 911 or calling your MD immediately  if symptoms less severe.  You Must read complete instructions/literature along with all the possible adverse reactions/side effects for all the Medicines you take and that have been prescribed to you. Take any new Medicines after you have completely understood and accpet all the possible adverse reactions/side effects.   Please note  You were cared for by a hospitalist during your hospital stay. If you have any  questions about your discharge medications or the care you received while you were in the hospital after you are discharged, you can call the unit and asked to speak with the hospitalist on call if the hospitalist that took care of you is not available. Once you are discharged, your primary care physician will handle any further medical issues. Please note that NO REFILLS for any discharge medications will be authorized once you are discharged, as it is imperative that you return to your primary care physician (or establish a relationship with a primary care physician if you do not have one) for your aftercare needs so that they can reassess your need for medications and monitor your lab values.    On the day of Discharge:  VITAL SIGNS:  Blood pressure (!) 173/74, pulse 100, temperature 97.7 F (36.5 C), temperature source Oral, resp. rate 18, height 6' (1.829 m), weight 85.3 kg, SpO2 98 %. PHYSICAL EXAMINATION:  GENERAL:  78 y.o.-year-old patient lying in the bed with no acute distress.  EYES: Pupils equal, round, reactive to light and accommodation. No scleral icterus. Extraocular muscles intact.  HEENT: Head atraumatic, normocephalic. Oropharynx and nasopharynx clear.  NECK:  Supple, no jugular venous distention. No thyroid enlargement, no tenderness.  LUNGS: Normal breath sounds bilaterally, no wheezing, rales,rhonchi or crepitation. No use of accessory muscles of respiration.  CARDIOVASCULAR: S1, S2 normal. No murmurs, rubs, or gallops.  ABDOMEN: Soft, non-tender, non-distended. Bowel sounds present. No organomegaly or mass.  EXTREMITIES: No pedal edema, cyanosis, or clubbing.  NEUROLOGIC: Cranial nerves II through XII are intact. Muscle strength 5/5 in all extremities. Sensation intact. Gait not checked.  PSYCHIATRIC: The patient is alert and oriented x 3.  SKIN: No obvious rash, lesion, or ulcer.  DATA REVIEW:   CBC Recent Labs  Lab 08/08/18 1402  WBC 5.5  HGB 9.5*  HCT 29.5*  PLT  275    Chemistries  Recent Labs  Lab 08/08/18 1402  NA 142  K 3.7  CL 110  CO2 23  GLUCOSE 112*  BUN 38*  CREATININE 2.00*  CALCIUM 8.0*  AST 17  ALT 18  ALKPHOS 64  BILITOT 0.4     Follow-up Information    Glean Hess, MD. Schedule an appointment as soon as possible for a visit on 08/18/2018.   Specialty:  Internal Medicine Contact information: [address] [place] [address] [phone]        Lequita Asal, MD. Schedule an appointment as soon as possible for a visit on 08/18/2018.   Specialty:  Hematology and Oncology Why:  at [address] Contact information: [place] [address] [phone]        Dermatology, [place]. Schedule an appointment as soon as possible for a visit in 1 week.   Why:  CALL FOR AN APPOINTMENT Contact information: [place] [address] [phone]            Management plans discussed with the patient, Dr Mike Gip and they are in agreement.  CODE STATUS: Prior   TOTAL TIME TAKING CARE OF THIS PATIENT: 45 minutes.    Max Sane M.D on 08/09/2018 at 2:41 PM  Between 7am to 6pm - Pager - [phone]  After 6pm go to [email or web address] - Proofreader  [facility] [place] [facility]  Office  [phone]  CC: Primary care physician; Glean Hess, MD   Note: This dictation was prepared with Dragon dictation along with smaller phrase technology. Any transcriptional errors that result from this process are unintentional.

## 2018-08-12 ENCOUNTER — Other Ambulatory Visit: Payer: Self-pay

## 2018-08-12 DIAGNOSIS — N184 Chronic kidney disease, stage 4 (severe): Principal | ICD-10-CM

## 2018-08-12 DIAGNOSIS — D631 Anemia in chronic kidney disease: Secondary | ICD-10-CM

## 2018-08-13 ENCOUNTER — Inpatient Hospital Stay: Payer: Medicare Other | Admitting: Hematology and Oncology

## 2018-08-14 ENCOUNTER — Inpatient Hospital Stay: Payer: Medicare Other

## 2018-08-14 ENCOUNTER — Encounter: Payer: Self-pay | Admitting: Hematology and Oncology

## 2018-08-14 ENCOUNTER — Other Ambulatory Visit: Payer: Self-pay | Admitting: Hematology and Oncology

## 2018-08-14 ENCOUNTER — Inpatient Hospital Stay: Payer: Medicare Other | Admitting: Hematology and Oncology

## 2018-08-14 VITALS — BP 178/68 | HR 85 | Temp 98.2°F | Resp 16 | Wt 188.1 lb

## 2018-08-14 DIAGNOSIS — Z803 Family history of malignant neoplasm of breast: Secondary | ICD-10-CM

## 2018-08-14 DIAGNOSIS — D225 Melanocytic nevi of trunk: Secondary | ICD-10-CM | POA: Diagnosis not present

## 2018-08-14 DIAGNOSIS — I129 Hypertensive chronic kidney disease with stage 1 through stage 4 chronic kidney disease, or unspecified chronic kidney disease: Secondary | ICD-10-CM

## 2018-08-14 DIAGNOSIS — Z801 Family history of malignant neoplasm of trachea, bronchus and lung: Secondary | ICD-10-CM | POA: Diagnosis not present

## 2018-08-14 DIAGNOSIS — Z87891 Personal history of nicotine dependence: Secondary | ICD-10-CM | POA: Diagnosis not present

## 2018-08-14 DIAGNOSIS — D631 Anemia in chronic kidney disease: Secondary | ICD-10-CM | POA: Diagnosis not present

## 2018-08-14 DIAGNOSIS — Z807 Family history of other malignant neoplasms of lymphoid, hematopoietic and related tissues: Secondary | ICD-10-CM | POA: Diagnosis not present

## 2018-08-14 DIAGNOSIS — N184 Chronic kidney disease, stage 4 (severe): Secondary | ICD-10-CM

## 2018-08-14 DIAGNOSIS — E538 Deficiency of other specified B group vitamins: Secondary | ICD-10-CM

## 2018-08-14 LAB — CBC WITH DIFFERENTIAL/PLATELET
Abs Immature Granulocytes: 0.06 10*3/uL (ref 0.00–0.07)
Basophils Absolute: 0 10*3/uL (ref 0.0–0.1)
Basophils Relative: 0 %
Eosinophils Absolute: 0.1 10*3/uL (ref 0.0–0.5)
Eosinophils Relative: 1 %
HCT: 29.2 % — ABNORMAL LOW (ref 39.0–52.0)
Hemoglobin: 9 g/dL — ABNORMAL LOW (ref 13.0–17.0)
Immature Granulocytes: 1 %
Lymphocytes Relative: 17 %
Lymphs Abs: 0.9 10*3/uL (ref 0.7–4.0)
MCH: 30.2 pg (ref 26.0–34.0)
MCHC: 30.8 g/dL (ref 30.0–36.0)
MCV: 98 fL (ref 80.0–100.0)
Monocytes Absolute: 0.5 10*3/uL (ref 0.1–1.0)
Monocytes Relative: 10 %
Neutro Abs: 3.7 10*3/uL (ref 1.7–7.7)
Neutrophils Relative %: 71 %
Platelets: 252 10*3/uL (ref 150–400)
RBC: 2.98 MIL/uL — ABNORMAL LOW (ref 4.22–5.81)
RDW: 16.2 % — ABNORMAL HIGH (ref 11.5–15.5)
WBC: 5.2 10*3/uL (ref 4.0–10.5)
nRBC: 0 % (ref 0.0–0.2)

## 2018-08-14 LAB — RETICULOCYTES
Immature Retic Fract: 11 % (ref 2.3–15.9)
RBC.: 3.01 MIL/uL — ABNORMAL LOW (ref 4.22–5.81)
Retic Count, Absolute: 59 10*3/uL (ref 19.0–186.0)
Retic Ct Pct: 2 % (ref 0.4–3.1)

## 2018-08-14 NOTE — Progress Notes (Signed)
Pt here for follow up. Denies any concerns at this time.  

## 2018-08-14 NOTE — Progress Notes (Signed)
[facility] day:  08/14/2018  Chief Complaint: Arthur Bowen is a 78 y.o. male with anemia of chronic kidney disease and B12 deficiency who is seen for 1 week assessment after interval hospitalization.  HPI:  The patient was last seen in the hematology clinic on 08/06/2018.  At that time, he felt good.  He noted some "indigestion".  He denied and shortness of breath or chest pain.  Hemoglobin was 5.8.  Given his acute drop in hemoglobin and inability to provide transfusion support in the infusion center, he was referred to the ER.  He was admitted to [facility] from 08/06/2018 - 08/07/2018.  He received 3 units of PRBCs.    While hospitalized, he received care for a large bleeding mole on his back.  At last visit, follow-up with dermatology was recommended.  He was seen by the wound care team. Patient has a scheduled follow up appointment with Dr. Phillip Heal on 08/22/2018 to have area excised.    CBC on 08/08/2018 revealed a hematocrit of 29.5 and hemoglobin 9.5.  Retic was 1.5%.  Creatinine was 2.0.  He received Procrit 50,000 units on 08/08/2018.  Patient is scheduled for a nuclear stress test on 08/18/2018.  During the interim, patient notes that he feels remarkably better follow his recent admission for blood transfusion and recent Procrit injection. Enery has improved. Patient is no longer pale in appearance. He denies any exertional shortness of breath. Patient denies that he has experienced any B symptoms. He denies any interval infections.   Patient advises that he maintains an adequate appetite. He is eating well. Weight today is 188 lb 0.8 oz (85.3 kg), which compared to his last visit to the clinic, represents a 1 pound increase.     Patient denies pain in the clinic today.   Past Medical History:  Diagnosis Date  . Acquired phimosis   . BPH (benign prostatic hypertrophy)   . Chronic kidney disease   . Dyspnea    with exertion  . Elevated PSA    . Gross hematuria   . Hematuria   . History of hiatal hernia   . Hypertension   . 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 Renal mass, left     Past Surgical History:  Procedure Laterality Date  . Circumscision  09/2015  . CORONARY ARTERY BYPASS GRAFT  2002   x3 @ [place]  . INGUINAL HERNIA REPAIR Left 2011  . RENAL BIOPSY  2014   neg in w/u renal mass/hematuria    Family History  Problem Relation Age of Onset  . Lymphoma Mother   . Hodgkin's lymphoma Mother   . Lung cancer Father   . Heart disease Brother   . Dementia Sister   . Dementia Brother   . Breast cancer Maternal Aunt     Social History:  reports that he quit smoking about 10 months ago. His smoking use included cigarettes. He has a 25.00 pack-year smoking history. He has quit using smokeless tobacco.  His smokeless tobacco use included chew. He reports that he does not drink alcohol or use drugs.  The patient is accompanied by his daughter, [name], today.  Allergies:  Allergies  Allergen Reactions  . Penicillins Other (See Comments)  . Sulfa Antibiotics Other (See Comments)    Current Medications: Current Outpatient Medications  Medication Sig Dispense Refill  . amLODipine (NORVASC) 5 MG tablet Take 1 tablet (5 mg total) by mouth daily. [place]  tablet 3  . aspirin 81 MG tablet Take 1 tablet by mouth daily. Reported on 09/02/2015    . atorvastatin (LIPITOR) 40 MG tablet TAKE 1 TABLET BY MOUTH ONCE DAILY (Patient taking differently: Take 20 mg by mouth daily at 6 PM. ) 90 tablet 3  . calcitRIOL (ROCALTROL) 0.25 MCG capsule Take 0.25 mcg by mouth daily.    . Cyanocobalamin (B-12) 1000 MCG/ML KIT Inject 1,000 mcg as directed every 30 (thirty) days. 1 kit 6  . Cyanocobalamin (VITAMIN B 12 PO) Take 1,000 mcg by mouth daily.     . finasteride (PROSCAR) 5 MG tablet TAKE 1 TABLET BY MOUTH ONCE DAILY 90 tablet 3  . fluticasone (FLONASE) 50 MCG/ACT nasal spray Place 2 sprays into both nostrils daily. (Patient taking  differently: Place 2 sprays into both nostrils as needed. ) 16 g 5  . hydrALAZINE (APRESOLINE) 50 MG tablet Take 50 mg by mouth 3 (three) times daily.    Marland Kitchen lisinopril (PRINIVIL,ZESTRIL) 20 MG tablet Take 20 mg by mouth daily.     . mycophenolate (CELLCEPT) 500 MG tablet Take 1,000 mg by mouth 2 (two) times daily.  3  . nitroGLYCERIN (NITROSTAT) 0.4 MG SL tablet Place 1 tablet (0.4 mg total) under the tongue every 5 (five) minutes as needed for chest pain. 50 tablet 3  . predniSONE (DELTASONE) 10 MG tablet Take 10 mg by mouth daily with breakfast.      No current facility-administered medications for this visit.     Review of Systems:  GENERAL:  Feels better.  More energy.  No fevers, sweats.  Weight up 1 pound. PERFORMANCE STATUS (ECOG):  1 HEENT:  No visual changes, runny nose, sore throat, mouth sores or tenderness. Lungs: No shortness of breath or cough.  No hemoptysis. Cardiac:  No chest pain, palpitations, orthopnea, or PND. GI:  Diarrhea occasionally on Cellcept.  No nausea, vomiting, constipation, melena or hematochezia. GU:  No urgency, frequency, dysuria, or hematuria. Musculoskeletal:  No back pain.  No joint pain.  No muscle tenderness. Extremities:  No pain or swelling. Skin:   Fragile skin.  Mole on back.  Bruises on aspirin (no change).  No rashes or skin changes. Neuro:  No headache, numbness or weakness, balance or coordination issues. Endocrine:  No diabetes, thyroid issues, hot flashes or night sweats. Psych:  No mood changes, depression or anxiety. Pain:  No focal pain. Review of systems:  All other systems reviewed and found to be negative.   Physical Exam: Blood pressure (!) 186/78, pulse 85, temperature 98.2 F (36.8 C), temperature source Oral, resp. rate 16, weight 188 lb 0.8 oz (85.3 kg), SpO2 100 %. GENERAL:  Well developed, well nourished, gentleman sitting comfortably in the exam room in no acute distress. MENTAL STATUS:  Alert and oriented to person, place  and time. HEAD:  Pearline Cables hair.  Male pattern baldness.  Normocephalic, atraumatic, face symmetric, no Cushingoid features. EYES:  Glasses.  Blue eyes.  Pupils equal round and reactive to light and accomodation.  No conjunctivitis or scleral icterus. ENT:  Oropharynx clear without lesion.  Tongue normal. Mucous membranes moist.  RESPIRATORY:  Clear to auscultation without rales, wheezes or rhonchi. CARDIOVASCULAR:  Regular rate and rhythm without murmur, rub or gallop. ABDOMEN:  Soft, non-tender, with active bowel sounds, and no hepatosplenomegaly.  No masses. SKIN:  2 cm irregular mole on back.  No rashes, ulcers or lesions. EXTREMITIES: No edema, no skin discoloration or tenderness.  No palpable cords. LYMPH NODES:  No palpable cervical, supraclavicular, axillary or inguinal adenopathy  NEUROLOGICAL: Unremarkable. PSYCH:  Appropriate.   Appointment on 08/14/2018  Component Date Value Ref Range Status  . WBC 08/14/2018 5.2  4.0 - 10.5 K/uL Final  . RBC 08/14/2018 2.98* 4.22 - 5.81 MIL/uL Final  . Hemoglobin 08/14/2018 9.0* 13.0 - 17.0 g/dL Final  . HCT 08/14/2018 29.2* 39.0 - 52.0 % Final  . MCV 08/14/2018 98.0  80.0 - 100.0 fL Final  . MCH 08/14/2018 30.2  26.0 - 34.0 pg Final  . MCHC 08/14/2018 30.8  30.0 - 36.0 g/dL Final  . RDW 08/14/2018 16.2* 11.5 - 15.5 % Final  . Platelets 08/14/2018 252  150 - 400 K/uL Final  . nRBC 08/14/2018 0.0  0.0 - 0.2 % Final  . Neutrophils Relative % 08/14/2018 71  % Final  . Neutro Abs 08/14/2018 3.7  1.7 - 7.7 K/uL Final  . Lymphocytes Relative 08/14/2018 17  % Final  . Lymphs Abs 08/14/2018 0.9  0.7 - 4.0 K/uL Final  . Monocytes Relative 08/14/2018 10  % Final  . Monocytes Absolute 08/14/2018 0.5  0.1 - 1.0 K/uL Final  . Eosinophils Relative 08/14/2018 1  % Final  . Eosinophils Absolute 08/14/2018 0.1  0.0 - 0.5 K/uL Final  . Basophils Relative 08/14/2018 0  % Final  . Basophils Absolute 08/14/2018 0.0  0.0 - 0.1 K/uL Final  . Immature Granulocytes  08/14/2018 1  % Final  . Abs Immature Granulocytes 08/14/2018 0.06  0.00 - 0.07 K/uL Final   Performed at [facility], [address], [place], [place] [address]    Assessment:  AMER ALCINDOR is a 78 y.o. male with anemia of chronic renal disease and B12 deficiency.  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   He has received Procrit: 40,000 units on 11/21/2017, 50,000 units on 12/26/2017, 50,000 units on 01/23/2018, 50,000 units on 02/20/2018.  Hemoglobin has been followed: 7.9 on 11/19/2017, 7.8 on 12/26/2017, 9.1 on 01/21/2018, 9.3 on 02/20/2018, and 9.2 on 06/19/2018.  He has a left renal mass s/p CT guided biopsy on 09/15/2012.  Pathology was negative.  Ultrasound of the kidney on 11/18/2017  revealed a stable 5.1 cm left kidney mass.  He is followed by Dr Erlene Quan.  He denies any hematuria.  He has B12 deficiency.  B12 was 311 on 06/05/2018.  He is on B12 injections (last 3-4 weeks ago at home).  Intrinsic factor antibody and anti-parietal antibody was negative on 08/06/2018.  He was admitted to [facility] from 06/05/2018 - 06/07/2018 secondary to symptomatic bradycardia (thought to due to beta-blocker) and symptomatic anemia.  Hemoglobin was 6.4.  He received 2 units of PRBCs.  Work-up revealed a ferritin 247, iron saturation 33%, and normal folate.  B12 was 311 (6177 on 10/24/2017).  He began B12 injections.  He was admitted to [facility] from 08/06/2018 - 08/07/2018.  Hemoglobin was 5.8.  Ferritin was 350.  B12 was 629.  Folate was 7.5.  He received 3 units of PRBCs.    Symptomatically, he is feeling better.  He has more energy.  Exam is stable.  Hemoglobin is 9.0.  Plan: 1.   Labs today:  CBC with diff, retic. 2.   Anemia of chronic kidney disease  Patient received Retacrit 50,000 units on 08/08/2018.  Hemoglobin goal 10.  Continue Retacrit 40,000 units every 28 days.  Consider increasing frequency if hemoglobin drops and patient requires transfusion  support. 3.   B12 deficiency  Continue B12 at home. 4.   Hypertension  Recheck blood pressure today.  5.   Mole  Patient has a large on his back with recent bleeding.  Patient is scheduled to see Dr. Phillip Heal, dermatologist. 6.   RTC on 09/05/2018 for MD assessment, labs (CBC with diff, CMP, hold tube), and +/- Retacrit.   Honor Loh, NP  08/14/2018, 11:19 AM   I saw and evaluated the patient, participating in the key portions of the service and reviewing pertinent diagnostic studies and records.  I reviewed the nurse practitioner's note and agree with the findings and the plan.  The assessment and plan were discussed with the patient.  Several questions were asked by the patient and answered.   Nolon Stalls, MD 08/14/2018,11:19 AM

## 2018-08-18 ENCOUNTER — Inpatient Hospital Stay: Payer: Medicare Other | Admitting: Internal Medicine

## 2018-08-18 DIAGNOSIS — I25119 Atherosclerotic heart disease of native coronary artery with unspecified angina pectoris: Secondary | ICD-10-CM | POA: Diagnosis not present

## 2018-08-18 DIAGNOSIS — I10 Essential (primary) hypertension: Secondary | ICD-10-CM | POA: Diagnosis not present

## 2018-08-18 DIAGNOSIS — R079 Chest pain, unspecified: Secondary | ICD-10-CM | POA: Diagnosis not present

## 2018-08-22 DIAGNOSIS — L821 Other seborrheic keratosis: Secondary | ICD-10-CM | POA: Diagnosis not present

## 2018-08-22 DIAGNOSIS — D485 Neoplasm of uncertain behavior of skin: Secondary | ICD-10-CM | POA: Diagnosis not present

## 2018-08-22 DIAGNOSIS — C44519 Basal cell carcinoma of skin of other part of trunk: Secondary | ICD-10-CM | POA: Diagnosis not present

## 2018-08-25 ENCOUNTER — Ambulatory Visit: Payer: Medicare Other | Admitting: Hematology and Oncology

## 2018-08-25 ENCOUNTER — Ambulatory Visit: Payer: Medicare Other

## 2018-08-25 ENCOUNTER — Inpatient Hospital Stay: Payer: Medicare Other | Admitting: Internal Medicine

## 2018-08-25 ENCOUNTER — Other Ambulatory Visit: Payer: Medicare Other

## 2018-09-05 ENCOUNTER — Inpatient Hospital Stay: Payer: Medicare Other | Attending: Hematology and Oncology | Admitting: Urgent Care

## 2018-09-05 ENCOUNTER — Other Ambulatory Visit: Payer: Self-pay

## 2018-09-05 ENCOUNTER — Inpatient Hospital Stay: Payer: Medicare Other

## 2018-09-05 VITALS — BP 156/76 | HR 80 | Temp 97.7°F | Resp 18 | Wt 189.8 lb

## 2018-09-05 DIAGNOSIS — C44519 Basal cell carcinoma of skin of other part of trunk: Secondary | ICD-10-CM | POA: Diagnosis not present

## 2018-09-05 DIAGNOSIS — Z803 Family history of malignant neoplasm of breast: Secondary | ICD-10-CM

## 2018-09-05 DIAGNOSIS — N183 Chronic kidney disease, stage 3 unspecified: Secondary | ICD-10-CM

## 2018-09-05 DIAGNOSIS — N184 Chronic kidney disease, stage 4 (severe): Principal | ICD-10-CM

## 2018-09-05 DIAGNOSIS — D649 Anemia, unspecified: Secondary | ICD-10-CM

## 2018-09-05 DIAGNOSIS — D631 Anemia in chronic kidney disease: Secondary | ICD-10-CM

## 2018-09-05 DIAGNOSIS — E538 Deficiency of other specified B group vitamins: Secondary | ICD-10-CM | POA: Diagnosis not present

## 2018-09-05 DIAGNOSIS — Z87891 Personal history of nicotine dependence: Secondary | ICD-10-CM

## 2018-09-05 DIAGNOSIS — I129 Hypertensive chronic kidney disease with stage 1 through stage 4 chronic kidney disease, or unspecified chronic kidney disease: Secondary | ICD-10-CM

## 2018-09-05 DIAGNOSIS — Z79899 Other long term (current) drug therapy: Secondary | ICD-10-CM

## 2018-09-05 DIAGNOSIS — Z807 Family history of other malignant neoplasms of lymphoid, hematopoietic and related tissues: Secondary | ICD-10-CM

## 2018-09-05 LAB — COMPREHENSIVE METABOLIC PANEL
ALT: 18 U/L (ref 0–44)
AST: 16 U/L (ref 15–41)
Albumin: 2.5 g/dL — ABNORMAL LOW (ref 3.5–5.0)
Alkaline Phosphatase: 66 U/L (ref 38–126)
Anion gap: 6 (ref 5–15)
BUN: 41 mg/dL — ABNORMAL HIGH (ref 8–23)
CO2: 24 mmol/L (ref 22–32)
Calcium: 8.2 mg/dL — ABNORMAL LOW (ref 8.9–10.3)
Chloride: 109 mmol/L (ref 98–111)
Creatinine, Ser: 2.04 mg/dL — ABNORMAL HIGH (ref 0.61–1.24)
GFR calc Af Amer: 35 mL/min — ABNORMAL LOW (ref >60)
GFR calc non Af Amer: 30 mL/min — ABNORMAL LOW (ref >60)
Glucose, Bld: 91 mg/dL (ref 70–99)
Potassium: 4.6 mmol/L (ref 3.5–5.1)
Sodium: 139 mmol/L (ref 135–145)
Total Bilirubin: 0.2 mg/dL — ABNORMAL LOW (ref 0.3–1.2)
Total Protein: 4.7 g/dL — ABNORMAL LOW (ref 6.5–8.1)

## 2018-09-05 LAB — CBC WITH DIFFERENTIAL/PLATELET
Abs Immature Granulocytes: 0.04 10*3/uL (ref 0.00–0.07)
Basophils Absolute: 0 10*3/uL (ref 0.0–0.1)
Basophils Relative: 0 %
Eosinophils Absolute: 0.1 10*3/uL (ref 0.0–0.5)
Eosinophils Relative: 3 %
HCT: 25.8 % — ABNORMAL LOW (ref 39.0–52.0)
Hemoglobin: 7.8 g/dL — ABNORMAL LOW (ref 13.0–17.0)
Immature Granulocytes: 1 %
Lymphocytes Relative: 31 %
Lymphs Abs: 1.1 10*3/uL (ref 0.7–4.0)
MCH: 30 pg (ref 26.0–34.0)
MCHC: 30.2 g/dL (ref 30.0–36.0)
MCV: 99.2 fL (ref 80.0–100.0)
Monocytes Absolute: 0.7 10*3/uL (ref 0.1–1.0)
Monocytes Relative: 19 %
Neutro Abs: 1.6 10*3/uL — ABNORMAL LOW (ref 1.7–7.7)
Neutrophils Relative %: 46 %
Platelets: 285 10*3/uL (ref 150–400)
RBC: 2.6 MIL/uL — ABNORMAL LOW (ref 4.22–5.81)
RDW: 16 % — ABNORMAL HIGH (ref 11.5–15.5)
WBC: 3.6 10*3/uL — ABNORMAL LOW (ref 4.0–10.5)
nRBC: 0 % (ref 0.0–0.2)

## 2018-09-05 LAB — SAMPLE TO BLOOD BANK

## 2018-09-05 MED ORDER — EPOETIN ALFA-EPBX 40000 UNIT/ML IJ SOLN
40000.0000 [IU] | Freq: Once | INTRAMUSCULAR | Status: AC
  Administered 2018-09-05: 40000 [IU] via SUBCUTANEOUS

## 2018-09-05 NOTE — Patient Instructions (Signed)
Epoetin Alfa injection °What is this medicine? °EPOETIN ALFA (e POE e tin AL fa) helps your body make more red blood cells. This medicine is used to treat anemia caused by chronic kidney disease, cancer chemotherapy, or HIV-therapy. It may also be used before surgery if you have anemia. °This medicine may be used for other purposes; ask your health care provider or pharmacist if you have questions. °COMMON BRAND NAME(S): Epogen, Procrit, Retacrit °What should I tell my health care provider before I take this medicine? °They need to know if you have any of these conditions: °-cancer °-heart disease °-high blood pressure °-history of blood clots °-history of stroke °-low levels of folate, iron, or vitamin B12 in the blood °-seizures °-an unusual or allergic reaction to erythropoietin, albumin, benzyl alcohol, hamster proteins, other medicines, foods, dyes, or preservatives °-pregnant or trying to get pregnant °-breast-feeding °How should I use this medicine? °This medicine is for injection into a vein or under the skin. It is usually given by a health care professional in a hospital or clinic setting. °If you get this medicine at home, you will be taught how to prepare and give this medicine. Use exactly as directed. Take your medicine at regular intervals. Do not take your medicine more often than directed. °It is important that you put your used needles and syringes in a special sharps container. Do not put them in a trash can. If you do not have a sharps container, call your pharmacist or healthcare provider to get one. °A special MedGuide will be given to you by the pharmacist with each prescription and refill. Be sure to read this information carefully each time. °Talk to your pediatrician regarding the use of this medicine in children. While this drug may be prescribed for selected conditions, precautions do apply. °Overdosage: If you think you have taken too much of this medicine contact a poison control center  or emergency room at once. °NOTE: This medicine is only for you. Do not share this medicine with others. °What if I miss a dose? °If you miss a dose, take it as soon as you can. If it is almost time for your next dose, take only that dose. Do not take double or extra doses. °What may interact with this medicine? °Interactions have not been studied. °This list may not describe all possible interactions. Give your health care provider a list of all the medicines, herbs, non-prescription drugs, or dietary supplements you use. Also tell them if you smoke, drink alcohol, or use illegal drugs. Some items may interact with your medicine. °What should I watch for while using this medicine? °Your condition will be monitored carefully while you are receiving this medicine. °You may need blood work done while you are taking this medicine. °This medicine may cause a decrease in vitamin B6. You should make sure that you get enough vitamin B6 while you are taking this medicine. Discuss the foods you eat and the vitamins you take with your health care professional. °What side effects may I notice from receiving this medicine? °Side effects that you should report to your doctor or health care professional as soon as possible: °-allergic reactions like skin rash, itching or hives, swelling of the face, lips, or tongue °-seizures °-signs and symptoms of a blood clot such as breathing problems; changes in vision; chest pain; severe, sudden headache; pain, swelling, warmth in the leg; trouble speaking; sudden numbness or weakness of the face, arm or leg °-signs and symptoms of a stroke   like changes in vision; confusion; trouble speaking or understanding; severe headaches; sudden numbness or weakness of the face, arm or leg; trouble walking; dizziness; loss of balance or coordination °Side effects that usually do not require medical attention (report to your doctor or health care professional if they continue or are  bothersome): °-chills °-cough °-dizziness °-fever °-headaches °-joint pain °-muscle cramps °-muscle pain °-nausea, vomiting °-pain, redness, or irritation at site where injected °This list may not describe all possible side effects. Call your doctor for medical advice about side effects. You may report side effects to FDA at 1-800-FDA-1088. °Where should I keep my medicine? °Keep out of the reach of children. °Store in a refrigerator between 2 and 8 degrees C (36 and 46 degrees F). Do not freeze or shake. Throw away any unused portion if using a single-dose vial. Multi-dose vials can be kept in the refrigerator for up to 21 days after the initial dose. Throw away unused medicine. °NOTE: This sheet is a summary. It may not cover all possible information. If you have questions about this medicine, talk to your doctor, pharmacist, or health care provider. °© 2019 [employer] (2017-01-11 08:35:19) ° °

## 2018-09-05 NOTE — Progress Notes (Signed)
[facility] [address], [place], [place] [address] Phone: [phone]  Fax: [phone]    Clinic day:  09/05/2018  Chief Complaint: Arthur [place] is a 78 y.o. male with anemia of chronic kidney disease and B12 deficiency who is seen for a 3-week assessment.  HPI:  The patient was last seen in the hematology clinic on 08/14/2018.  At that time, patient was doing well overall.  He felt remarkably better following following his recent blood transfusion and Procrit injection. Energy had improved. Patient had a large bleeding mole on his back; seeing dermatology. Patient denies any B symptoms or interval infections. Eating well; weight up 1 pound. Exam was stable.  WBC 5200 ([place] 3700).  Hemoglobin 9.0, hematocrit 29.2, and platelets 252,000.  Reticulocytes 2.0%.  Patient last received Retacrit 50,000 unit injection on 08/08/2018.  He continues on monthly parenteral B12 supplementation (last 08/11/2018).  Patient seen in follow-up consult by Dr. Phillip Heal (dermatology) on 09/05/2018.  Notes reviewed.  Patient had a 3.6 cm verrucous fungating nodule removed from his RIGHT scapula.  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  Tangential biopsy did not yield clean margins.  Patient to return on 10/02/2018 for repeat excision and sutured wound closure.  In the interim, patient has been doing well.  He notes that his energy remains stable.  He denies any shortness of breath at rest, and only minimal exertional dyspnea.  Patient has been making efforts to remain active.  He notes that he was outside walking around his house yesterday in the nice weather. Patient denies bleeding; no hematochezia, melena, or gross hematuria. Patient denies that he has experienced any B symptoms. He denies any interval infections.  Patient denies any issues resulting from his recent skin biopsy.  Patient followed regularly by nephrology Holley Raring, MD).  Daughter with concerns about  fluctuating renal function.  Daughter with concerns that CellCept may be contributing to patient's worsening renal function.  Neck scheduled follow-up appointment with nephrology is next week, at which time patient daughter plans to discuss concerns with Dr. Zollie Scale.  Patient advises that he maintains an adequate appetite. He is eating well. Weight today is 189 lb 13.1 oz (86.1 kg), which compared to his last visit to the clinic, represents a 1 pound increase.  Patient denies pain in the clinic today.  Past Medical History:  Diagnosis Date  . Acquired phimosis   . BPH (benign prostatic hypertrophy)   . Chronic kidney disease   . Dyspnea    with exertion  . Elevated PSA   . Gross hematuria   . Hematuria   . History of hiatal hernia   . Hypertension   . 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 Renal mass, left     Past Surgical History:  Procedure Laterality Date  . Circumscision  09/2015  . CORONARY ARTERY BYPASS GRAFT  2002   x3 @ [employer]  . INGUINAL HERNIA REPAIR Left 2011  . RENAL BIOPSY  2014   neg in w/u renal mass/hematuria    Family History  Problem Relation Age of Onset  . Lymphoma Mother   . Hodgkin's lymphoma Mother   . Lung cancer Father   . Heart disease Brother   . Dementia Sister   . Dementia Brother   . Breast cancer Maternal Aunt     Social History:  reports that he quit smoking about 10 months ago. His smoking use included cigarettes. He has a 25.00 pack-year smoking  history. He has quit using smokeless tobacco.  His smokeless tobacco use included chew. He reports that he does not drink alcohol or use drugs.  The patient is accompanied by his daughter, [name], today.  Allergies:  Allergies  Allergen Reactions  . Penicillins Other (See Comments)  . Sulfa Antibiotics Other (See Comments)    Current Medications: Current Outpatient Medications  Medication Sig Dispense Refill  . amLODipine (NORVASC) 5 MG tablet Take 1 tablet (5 mg total) by mouth  daily. 90 tablet 3  . aspirin 81 MG tablet Take 1 tablet by mouth daily. Reported on 09/02/2015    . atorvastatin (LIPITOR) 40 MG tablet TAKE 1 TABLET BY MOUTH ONCE DAILY (Patient taking differently: Take 20 mg by mouth daily at 6 PM. ) 90 tablet 3  . calcitRIOL (ROCALTROL) 0.25 MCG capsule Take 0.25 mcg by mouth daily.    . Cyanocobalamin (B-12) 1000 MCG/ML KIT Inject 1,000 mcg as directed every 30 (thirty) days. 1 kit 6  . Cyanocobalamin (VITAMIN B 12 PO) Take 1,000 mcg by mouth daily.     . finasteride (PROSCAR) 5 MG tablet TAKE 1 TABLET BY MOUTH ONCE DAILY 90 tablet 3  . fluticasone (FLONASE) 50 MCG/ACT nasal spray Place 2 sprays into both nostrils daily. (Patient taking differently: Place 2 sprays into both nostrils as needed. ) 16 g 5  . hydrALAZINE (APRESOLINE) 50 MG tablet Take 50 mg by mouth 3 (three) times daily.    Marland Kitchen lisinopril (PRINIVIL,ZESTRIL) 20 MG tablet Take 20 mg by mouth daily.     . mycophenolate (CELLCEPT) 500 MG tablet Take 1,000 mg by mouth 2 (two) times daily.  3  . nitroGLYCERIN (NITROSTAT) 0.4 MG SL tablet Place 1 tablet (0.4 mg total) under the tongue every 5 (five) minutes as needed for chest pain. 50 tablet 3  . predniSONE (DELTASONE) 10 MG tablet Take 10 mg by mouth daily with breakfast.      No current facility-administered medications for this visit.     Review of Systems  Constitutional: Negative for diaphoresis, fever, malaise/fatigue and weight loss (up 1 pound).  HENT: Negative.   Eyes: Negative.   Respiratory: Positive for shortness of breath (exertional; improved). Negative for cough, hemoptysis and sputum production.   Cardiovascular: Negative for chest pain, palpitations, orthopnea, leg swelling and PND.  Gastrointestinal: Negative for abdominal pain, blood in stool, constipation, diarrhea, melena, nausea and vomiting.  Genitourinary: Negative for dysuria, frequency, hematuria and urgency.  Musculoskeletal: Negative for back pain, falls, joint pain and  myalgias.  Skin: Negative for itching and rash.       Recent tangential biopsy to RIGHT scapula; pathology (+) of BCC.  Neurological: Negative for dizziness, tremors, weakness and headaches.  Endo/Heme/Allergies: Bruises/bleeds easily (on daily ASA).  Psychiatric/Behavioral: Negative for depression, memory loss and suicidal ideas. The patient is not nervous/anxious and does not have insomnia.   All other systems reviewed and are negative.  Performance status (ECOG): 1 - Symptomatic but completely ambulatory  Vital Signs BP (!) 156/76 (BP Location: Left Arm, Patient Position: Sitting)   Pulse 80   Temp 97.7 F (36.5 C) (Tympanic)   Resp 18   Wt 189 lb 13.1 oz (86.1 kg)   SpO2 100%   BMI 25.74 kg/m   Physical Exam  Constitutional: He is oriented to person, place, and time and well-developed, well-nourished, and in no distress.  HENT:  Head: Normocephalic and atraumatic.  Mouth/Throat: Oropharynx is clear and moist and mucous membranes are normal.  Eyes:  Pupils are equal, round, and reactive to light. EOM are normal. No scleral icterus.  Neck: Normal range of motion. Neck supple. No tracheal deviation present. No thyromegaly present.  Cardiovascular: Normal rate, regular rhythm, normal heart sounds and intact distal pulses. Exam reveals no gallop and no friction rub.  No murmur heard. Pulmonary/Chest: Effort normal and breath sounds normal. No respiratory distress. He has no wheezes. He has no rales.  Abdominal: Soft. Bowel sounds are normal. He exhibits no distension. There is no abdominal tenderness.  Musculoskeletal: Normal range of motion.        General: No tenderness or edema.  Neurological: He is alert and oriented to person, place, and time.  Skin: Skin is warm and dry. Bruising (scattered) noted. No rash noted. No erythema.  [place] removed from RIGHT scapular back; margins were not clear. Cavernous areas resulting with yellow slough present. No drainage or sighs/symptoms of  infection.  Psychiatric: Mood, affect and judgment normal.  Nursing note and vitals reviewed.   Appointment on 09/05/2018  Component Date Value Ref Range Status  . Sodium 09/05/2018 139  135 - 145 mmol/L Final  . Potassium 09/05/2018 4.6  3.5 - 5.1 mmol/L Final  . Chloride 09/05/2018 109  98 - 111 mmol/L Final  . CO2 09/05/2018 24  22 - 32 mmol/L Final  . Glucose, Bld 09/05/2018 91  70 - 99 mg/dL Final  . BUN 09/05/2018 41* 8 - 23 mg/dL Final  . Creatinine, Ser 09/05/2018 2.04* 0.61 - 1.24 mg/dL Final  . Calcium 09/05/2018 8.2* 8.9 - 10.3 mg/dL Final  . Total Protein 09/05/2018 4.7* 6.5 - 8.1 g/dL Final  . Albumin 09/05/2018 2.5* 3.5 - 5.0 g/dL Final  . AST 09/05/2018 16  15 - 41 U/L Final  . ALT 09/05/2018 18  0 - 44 U/L Final  . Alkaline Phosphatase 09/05/2018 66  38 - 126 U/L Final  . Total Bilirubin 09/05/2018 0.2* 0.3 - 1.2 mg/dL Final  . GFR calc non Af Amer 09/05/2018 30* >60 mL/min Final  . GFR calc Af Amer 09/05/2018 35* >60 mL/min Final  . Anion gap 09/05/2018 6  5 - 15 Final   Performed at [facility], [address] [place], [place] [address]  . WBC 09/05/2018 3.6* 4.0 - 10.5 K/uL Final  . RBC 09/05/2018 2.60* 4.22 - 5.81 MIL/uL Final  . Hemoglobin 09/05/2018 7.8* 13.0 - 17.0 g/dL Final  . HCT 09/05/2018 25.8* 39.0 - 52.0 % Final  . MCV 09/05/2018 99.2  80.0 - 100.0 fL Final  . MCH 09/05/2018 30.0  26.0 - 34.0 pg Final  . MCHC 09/05/2018 30.2  30.0 - 36.0 g/dL Final  . RDW 09/05/2018 16.0* 11.5 - 15.5 % Final  . Platelets 09/05/2018 285  150 - 400 K/uL Final  . nRBC 09/05/2018 0.0  0.0 - 0.2 % Final  . Neutrophils Relative % 09/05/2018 46  % Final  . Neutro Abs 09/05/2018 1.6* 1.7 - 7.7 K/uL Final  . Lymphocytes Relative 09/05/2018 31  % Final  . Lymphs Abs 09/05/2018 1.1  0.7 - 4.0 K/uL Final  . Monocytes Relative 09/05/2018 19  % Final  . Monocytes Absolute 09/05/2018 0.7  0.1 - 1.0 K/uL Final  . Eosinophils Relative 09/05/2018 3  % Final  .  Eosinophils Absolute 09/05/2018 0.1  0.0 - 0.5 K/uL Final  . Basophils Relative 09/05/2018 0  % Final  . Basophils Absolute 09/05/2018 0.0  0.0 - 0.1 K/uL Final  . Immature Granulocytes  09/05/2018 1  % Final  . Abs Immature Granulocytes 09/05/2018 0.04  0.00 - 0.07 K/uL Final   Performed at [facility], [address], [place], [place] [address]    Assessment:  SOHRAB KEELAN is a 78 y.o. male with anemia of chronic renal disease and B12 deficiency.  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   He has received Procrit: 40,000 units on 11/21/2017, 50,000 units on 12/26/2017, 50,000 units on 01/23/2018, 50,000 units on 02/20/2018. He began Retacrit: 50,000 units on 08/08/2018. Hemoglobin has been followed: 7.9 on 11/19/2017, 7.8 on 12/26/2017, 9.1 on 01/21/2018, 9.3 on 02/20/2018, 9.2 on 06/19/2018, 5.8 on 08/06/2018, 6.9 on 08/07/2018, 9.5 on 08/08/2018, 9.0 on 08/14/2018, and 7.8 on 09/05/2018.  He has a left renal mass s/p CT guided biopsy on 09/15/2012.  Pathology was negative.  Ultrasound of the kidney on 11/18/2017  revealed a stable 5.1 cm left kidney mass.  He is followed by Dr. Erlene Quan.  He denies any hematuria.  He had a large (3.6 cm) lesion removed from his RIGHT scapular area on 08/22/2018. Pathology (+) for nodular basal cell carcinoma.   He has B12 deficiency.  B12 was 311 on 06/05/2018.  He is on B12 injections (last 3-4 weeks ago at home).  Intrinsic factor antibody and anti-parietal antibody was negative on 08/06/2018.  He was admitted to [facility] from 06/05/2018 - 06/07/2018 secondary to symptomatic bradycardia (thought to due to beta-blocker) and symptomatic anemia.  Hemoglobin was 6.4.  He received 2 units of PRBCs.  Work-up revealed a ferritin 247, iron saturation 33%, and normal folate.  B12 was 311 (6177 on 10/24/2017).  He began B12 injections.  He was admitted to [facility] from 08/06/2018 - 08/07/2018.  Hemoglobin was 5.8.  Ferritin was 350.   B12 was 629.  Folate was 7.5.  He received 3 units of PRBCs.    Symptomatically, patient has been doing well overall.  He notes that his energy has been stable.  He denies shortness of breath at rest, however continues to have exertional dyspnea.  Patien making efforts to remain active.  He has been trying to take advantage of the nice weather; walking around outside of his house.  Lesion removed from right scapular back: Pathology (+) for [facility] with indeterminate margins.  Patient to return on 10/02/2018 for further excision and sutured skin closure.  Exam reveals cavernous area to right scapula following lesion excision.  Yellow slough present.  No erythema or other signs and symptoms of infection.  WBC 3600 (ANC 1600).  Hemoglobin 7.8, hematocrit 25.8, and platelets 285,000.  BUN 41 and creatinine 2.04 mg/dL. Calcium 8.2 mg/dL (corrected 9.4 mg/dL).   Plan: 1. Labs today:  CBC with diff, CMP, hold tube 2. Anemia of chronic kidney disease  Labs reviewed.  Hemoglobin 7.8, hematocrit 25.8, MCV 99.2, and platelets 285,000.  Review goal hemoglobin of >/= 10 g/dL.   Discuss potential need for PRBC transfusion.  Patient declines citing that he is not symptomatic.  Last received Retacrit 50,000 unit injection on 08/08/2018.  Will receive Retacrit 40,000 unit injection today.  Discussed increase in frequency due to anemia refractory to ESA injection.  Patient amenable.  Will RTC every 2 weeks for labs (CBC with differential) and +/- Retacrit. 3. CKD-III  Labs reviewed as stable.   BUN 41 and creatinine 2.04 mg/dL.  Estimated Creatinine Clearance: 32.2 mL/min (A) (by C-G formula based on SCr of 2.04 mg/dL (H)).  Family with concerns related  to CellCept causing declining kidney function.  Patient followed by nephrology Holley Raring, MD) - has RTC visit scheduled for next week.  Encouraged to discuss concerns with nephrology. 4. B12 deficiency  Continues on monthly parenteral B12  supplementation.  Patient receives injection at home on the 24th of every month. 5. Basal cell carcinoma  Recent tangential biopsy of lesion to right scapula revealed nodular BCC  Margins were not clear.  Cavitary area present following removal of lesion. (+) yellow slough. No signs of infection.  Encouraged to keep area clean and dry to prevent infection.  Patient to follow-up with Dr. Phillip Heal on 10/02/2018 for further excision and sutured wound closure. 6. RTC in 2 weeks for labs (CBC with differential, hold tube) and +/- Retacrit. 7. RTC in 4 weeks for MD assessment, labs (CBC with differential, CMP, hold tube), and +/- Retacrit.    Honor Loh, NP  09/05/2018, 12:13 PM

## 2018-09-05 NOTE — Progress Notes (Signed)
Pt here for follow up. Denies any concerns.  

## 2018-09-08 DIAGNOSIS — N183 Chronic kidney disease, stage 3 (moderate): Secondary | ICD-10-CM | POA: Diagnosis not present

## 2018-09-08 DIAGNOSIS — D631 Anemia in chronic kidney disease: Secondary | ICD-10-CM | POA: Diagnosis not present

## 2018-09-08 DIAGNOSIS — N041 Nephrotic syndrome with focal and segmental glomerular lesions: Secondary | ICD-10-CM | POA: Diagnosis not present

## 2018-09-08 DIAGNOSIS — I10 Essential (primary) hypertension: Secondary | ICD-10-CM | POA: Diagnosis not present

## 2018-09-11 ENCOUNTER — Encounter: Payer: Self-pay | Admitting: Dermatology

## 2018-09-19 ENCOUNTER — Inpatient Hospital Stay: Payer: Medicare Other

## 2018-09-19 ENCOUNTER — Inpatient Hospital Stay: Payer: Medicare Other | Attending: Hematology and Oncology

## 2018-09-19 ENCOUNTER — Other Ambulatory Visit: Payer: Self-pay

## 2018-09-19 ENCOUNTER — Telehealth: Payer: Self-pay

## 2018-09-19 ENCOUNTER — Other Ambulatory Visit: Payer: Self-pay | Admitting: Hematology and Oncology

## 2018-09-19 VITALS — BP 185/73 | HR 85 | Temp 98.0°F | Resp 18

## 2018-09-19 DIAGNOSIS — Z7982 Long term (current) use of aspirin: Secondary | ICD-10-CM | POA: Diagnosis not present

## 2018-09-19 DIAGNOSIS — N184 Chronic kidney disease, stage 4 (severe): Secondary | ICD-10-CM

## 2018-09-19 DIAGNOSIS — N183 Chronic kidney disease, stage 3 (moderate): Secondary | ICD-10-CM | POA: Diagnosis not present

## 2018-09-19 DIAGNOSIS — D631 Anemia in chronic kidney disease: Secondary | ICD-10-CM | POA: Insufficient documentation

## 2018-09-19 DIAGNOSIS — Z87891 Personal history of nicotine dependence: Secondary | ICD-10-CM | POA: Diagnosis not present

## 2018-09-19 DIAGNOSIS — Z79899 Other long term (current) drug therapy: Secondary | ICD-10-CM | POA: Insufficient documentation

## 2018-09-19 LAB — CBC WITH DIFFERENTIAL/PLATELET
Abs Immature Granulocytes: 0.04 10*3/uL (ref 0.00–0.07)
Basophils Absolute: 0 10*3/uL (ref 0.0–0.1)
Basophils Relative: 0 %
Eosinophils Absolute: 0.1 10*3/uL (ref 0.0–0.5)
Eosinophils Relative: 3 %
HCT: 27.5 % — ABNORMAL LOW (ref 39.0–52.0)
Hemoglobin: 8.3 g/dL — ABNORMAL LOW (ref 13.0–17.0)
Immature Granulocytes: 1 %
Lymphocytes Relative: 22 %
Lymphs Abs: 0.7 10*3/uL (ref 0.7–4.0)
MCH: 31 pg (ref 26.0–34.0)
MCHC: 30.2 g/dL (ref 30.0–36.0)
MCV: 102.6 fL — ABNORMAL HIGH (ref 80.0–100.0)
Monocytes Absolute: 0.6 10*3/uL (ref 0.1–1.0)
Monocytes Relative: 20 %
Neutro Abs: 1.6 10*3/uL — ABNORMAL LOW (ref 1.7–7.7)
Neutrophils Relative %: 54 %
Platelets: 348 10*3/uL (ref 150–400)
RBC: 2.68 MIL/uL — ABNORMAL LOW (ref 4.22–5.81)
RDW: 16.8 % — ABNORMAL HIGH (ref 11.5–15.5)
WBC: 3 10*3/uL — ABNORMAL LOW (ref 4.0–10.5)
nRBC: 0 % (ref 0.0–0.2)

## 2018-09-19 LAB — SAMPLE TO BLOOD BANK

## 2018-09-19 MED ORDER — EPOETIN ALFA-EPBX 40000 UNIT/ML IJ SOLN: 40000.0000 [IU] | Freq: Once | INTRAMUSCULAR | Status: DC

## 2018-09-19 NOTE — Telephone Encounter (Signed)
Patient daughter called saying his BP has been elevated for his past few visits at oncology. Told patient daughter that his nephrologist is managing his BP and she needs to call them. She tried to call them and they are closed already.  Dr Army Melia instructed for her to get dad to take hydralazine 1.5 tablets TID. Told her to do this over the weekend but call the nephrologist first thing Monday morning.  She verbalized understanding of this.

## 2018-09-22 ENCOUNTER — Inpatient Hospital Stay: Payer: Medicare Other

## 2018-09-22 ENCOUNTER — Other Ambulatory Visit: Payer: Self-pay

## 2018-09-22 VITALS — BP 152/60 | Temp 98.1°F | Resp 18

## 2018-09-22 DIAGNOSIS — R809 Proteinuria, unspecified: Secondary | ICD-10-CM | POA: Diagnosis not present

## 2018-09-22 DIAGNOSIS — N184 Chronic kidney disease, stage 4 (severe): Secondary | ICD-10-CM | POA: Diagnosis not present

## 2018-09-22 DIAGNOSIS — I10 Essential (primary) hypertension: Secondary | ICD-10-CM | POA: Diagnosis not present

## 2018-09-22 DIAGNOSIS — D631 Anemia in chronic kidney disease: Secondary | ICD-10-CM | POA: Diagnosis not present

## 2018-09-22 DIAGNOSIS — Z7982 Long term (current) use of aspirin: Secondary | ICD-10-CM | POA: Diagnosis not present

## 2018-09-22 DIAGNOSIS — I129 Hypertensive chronic kidney disease with stage 1 through stage 4 chronic kidney disease, or unspecified chronic kidney disease: Secondary | ICD-10-CM

## 2018-09-22 DIAGNOSIS — D649 Anemia, unspecified: Secondary | ICD-10-CM

## 2018-09-22 DIAGNOSIS — R3129 Other microscopic hematuria: Secondary | ICD-10-CM | POA: Diagnosis not present

## 2018-09-22 DIAGNOSIS — Z79899 Other long term (current) drug therapy: Secondary | ICD-10-CM | POA: Diagnosis not present

## 2018-09-22 DIAGNOSIS — N183 Chronic kidney disease, stage 3 (moderate): Secondary | ICD-10-CM | POA: Diagnosis not present

## 2018-09-22 DIAGNOSIS — Z87891 Personal history of nicotine dependence: Secondary | ICD-10-CM | POA: Diagnosis not present

## 2018-09-22 MED ORDER — EPOETIN ALFA-EPBX 40000 UNIT/ML IJ SOLN
40000.0000 [IU] | Freq: Once | INTRAMUSCULAR | Status: AC
  Administered 2018-09-22: 11:00:00 40000 [IU] via SUBCUTANEOUS
  Filled 2018-09-22: qty 1

## 2018-09-27 ENCOUNTER — Other Ambulatory Visit: Payer: Self-pay | Admitting: Internal Medicine

## 2018-09-30 ENCOUNTER — Other Ambulatory Visit: Payer: Self-pay

## 2018-09-30 DIAGNOSIS — I10 Essential (primary) hypertension: Secondary | ICD-10-CM

## 2018-09-30 MED ORDER — AMLODIPINE BESYLATE 5 MG PO TABS: 5.0000 mg | ORAL_TABLET | Freq: Every day | ORAL | 1 refills | Status: DC

## 2018-09-30 NOTE — Progress Notes (Signed)
Patient daughter called saying patient needs refill on Amlodipine. Sent in refill to patients pharmacy as requested.

## 2018-10-02 DIAGNOSIS — C44519 Basal cell carcinoma of skin of other part of trunk: Secondary | ICD-10-CM | POA: Diagnosis not present

## 2018-10-02 DIAGNOSIS — C4491 Basal cell carcinoma of skin, unspecified: Secondary | ICD-10-CM | POA: Diagnosis not present

## 2018-10-02 NOTE — Progress Notes (Signed)
[facility] [place] [address], [address] [place], [place] [address] Phone: [phone]  Fax: [phone]  Clinic day:  10/03/2018  Chief Complaint: Arthur Bowen is a 78 y.o. male with anemia of chronic kidney disease and b12 deficiency who returns to clinic for re-evaluation.    HPI:  The patient was last seen in the hematology clinic on 09/05/2018. At that time, he reported doing well overall. He was asymptomatic at that time. Hemoglobin was 7.8. pRBCs were recommended but patient declined. Platelet count 285,000 and he received Retacrit 40,000 u. He continues monthly parenteral B12; self administered at home.   In the interim, dermatology/Dr. Graham has re-excised nodule on back to get clear margins. 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   He has had some medication changes per Dr. Lateef/nephrology due to fluctuating renal function. He has stopped mycophenolate/cellcept. Hydralazine was increased to 75mg three times a day.   Today, patient says he is doing well. Energy is stable. He denies shortness of breath. Minimal exertional dyspnea when doing yard work. He denies bleeding, hematochezia, melena, gross hematuria. He denies fever or chills. Denies interval infections. Says he is healing well from recent skin biopsy and has minimal pain. Eating and drinking normally. Weight today is up 1 lb.   Due to COVID-19 restrictions, patient's daughter Tammie participated in visit by phone and contributes to HPI.   Past Medical History:  Diagnosis Date  . Acquired phimosis   . BPH (benign prostatic hypertrophy)   . Chronic kidney disease   . Dyspnea    with exertion  . Elevated PSA   . Gross hematuria   . Hematuria   . History of hiatal hernia   . Hypertension   . 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 Renal mass, left     Past Surgical History:  Procedure Laterality Date  . Circumscision  09/2015  . CORONARY ARTERY BYPASS  GRAFT  2002   x3 @ [facility]  . INGUINAL HERNIA REPAIR Left 2011  . RENAL BIOPSY  2014   neg in w/u renal mass/hematuria    Family History  Problem Relation Age of Onset  . Lymphoma Mother   . Hodgkin's lymphoma Mother   . Lung cancer Father   . Heart disease Brother   . Dementia Sister   . Dementia Brother   . Breast cancer Maternal Aunt     Social History:  reports that he quit smoking about a year ago. His smoking use included cigarettes. He has a 25.00 pack-year smoking history. He has quit using smokeless tobacco.  His smokeless tobacco use included chew. He reports that he does not drink alcohol or use drugs.    Allergies:  Allergies  Allergen Reactions  . Penicillins Other (See Comments)  . Sulfa Antibiotics Other (See Comments)    Current Medications: Current Outpatient Medications  Medication Sig Dispense Refill  . amLODipine (NORVASC) 5 MG tablet Take 1 tablet (5 mg total) by mouth daily. 90 tablet 1  . aspirin 81 MG tablet Take 1 tablet by mouth daily. Reported on 09/02/2015    . atorvastatin (LIPITOR) 40 MG tablet TAKE 1 TABLET BY MOUTH ONCE DAILY (Patient taking differently: Take 20 mg by mouth daily at 6 PM. ) 90 tablet 3  . calcitRIOL (ROCALTROL) 0.25 MCG capsule Take 0.25 mcg by mouth daily.    . Cyanocobalamin (B-12) 1000 MCG/ML KIT Inject 1,000 mcg as directed every 30 (thirty) days. 1 kit 6  .   Cyanocobalamin (VITAMIN B 12 PO) Take 1,000 mcg by mouth daily.     . finasteride (PROSCAR) 5 MG tablet Take 1 tablet by mouth once daily 90 tablet 3  . fluticasone (FLONASE) 50 MCG/ACT nasal spray Place 2 sprays into both nostrils daily. (Patient taking differently: Place 2 sprays into both nostrils as needed. ) 16 g 5  . hydrALAZINE (APRESOLINE) 50 MG tablet Take 75 mg by mouth 3 (three) times daily.    . lisinopril (PRINIVIL,ZESTRIL) 20 MG tablet Take 20 mg by mouth daily.     . nitroGLYCERIN (NITROSTAT) 0.4 MG SL tablet Place 1 tablet (0.4 mg total) under the tongue  every 5 (five) minutes as needed for chest pain. 50 tablet 3  . predniSONE (DELTASONE) 10 MG tablet Take 10 mg by mouth daily with breakfast.      No current facility-administered medications for this visit.     Review of Systems  Constitutional: Negative for diaphoresis, fever, malaise/fatigue and weight loss (up 1 pound).  HENT: Negative.   Eyes: Negative.   Respiratory: Positive for shortness of breath (exertional; improved). Negative for cough, hemoptysis and sputum production.   Cardiovascular: Negative for chest pain, palpitations, orthopnea, leg swelling and PND.  Gastrointestinal: Negative for abdominal pain, blood in stool, constipation, diarrhea, melena, nausea and vomiting.  Genitourinary: Negative for dysuria, frequency, hematuria and urgency.       Nocturia  Musculoskeletal: Negative for back pain, falls, joint pain and myalgias.  Skin: Negative for itching and rash.       Re-excision of nodule performed on 10/02/2018 & tender/bandage overlying  Neurological: Negative for dizziness, tremors, weakness and headaches.  Endo/Heme/Allergies: Bruises/bleeds easily (on daily ASA).  Psychiatric/Behavioral: Negative for depression, memory loss and suicidal ideas. The patient is not nervous/anxious and does not have insomnia.   All other systems reviewed and are negative.  ECOG = 1- restricted in physically strenuous activity but ambulatory & able to carry out work of a light or sedentary nature.   Vital Signs BP (!) 160/80   Pulse 84   Temp 99 F (37.2 C)   Resp 18    Physical Exam  Constitutional: He is oriented to person, place, and time and well-developed, well-nourished, and in no distress. No distress.  Elderly male. Unaccompanied.  HENT:  Head: Normocephalic.  Mouth/Throat: Oropharynx is clear and moist and mucous membranes are normal.  Eyes: Conjunctivae are normal. No scleral icterus.  Neck: Normal range of motion. Neck supple.  Cardiovascular: Normal rate,  regular rhythm and intact distal pulses.  Pulmonary/Chest: Breath sounds normal. No stridor. He has no wheezes. He has no rales.  Abdominal: Soft. Bowel sounds are normal. He exhibits no distension. There is no abdominal tenderness.  Musculoskeletal:        General: No tenderness or edema.  Neurological: He is alert and oriented to person, place, and time.  Skin: Skin is warm and dry. Bruising (scattered) noted. No rash noted. No erythema.  Right scapular back wound with bandage overlying  Psychiatric: Mood, affect and judgment normal.  Nursing note and vitals reviewed.   Appointment on 10/03/2018  Component Date Value Ref Range Status  . Sodium 10/03/2018 137  135 - 145 mmol/L Final  . Potassium 10/03/2018 4.3  3.5 - 5.1 mmol/L Final  . Chloride 10/03/2018 113* 98 - 111 mmol/L Final  . CO2 10/03/2018 20* 22 - 32 mmol/L Final  . Glucose, Bld 10/03/2018 103* 70 - 99 mg/dL Final  . BUN 10/03/2018 58* 8 -   23 mg/dL Final  . Creatinine, Ser 10/03/2018 2.24* 0.61 - 1.24 mg/dL Final  . Calcium 10/03/2018 8.1* 8.9 - 10.3 mg/dL Final  . Total Protein 10/03/2018 4.5* 6.5 - 8.1 g/dL Final  . Albumin 10/03/2018 2.3* 3.5 - 5.0 g/dL Final  . AST 10/03/2018 22  15 - 41 U/L Final  . ALT 10/03/2018 18  0 - 44 U/L Final  . Alkaline Phosphatase 10/03/2018 70  38 - 126 U/L Final  . Total Bilirubin 10/03/2018 0.7  0.3 - 1.2 mg/dL Final  . GFR calc non Af Amer 10/03/2018 27* >60 mL/min Final  . GFR calc Af Amer 10/03/2018 31* >60 mL/min Final  . Anion gap 10/03/2018 4* 5 - 15 Final   Performed at [facility], [address] [place], [place] [address]  . WBC 10/03/2018 3.5* 4.0 - 10.5 K/uL Final  . RBC 10/03/2018 2.69* 4.22 - 5.81 MIL/uL Final  . Hemoglobin 10/03/2018 8.2* 13.0 - 17.0 g/dL Final  . HCT 10/03/2018 27.1* 39.0 - 52.0 % Final  . MCV 10/03/2018 100.7* 80.0 - 100.0 fL Final  . MCH 10/03/2018 30.5  26.0 - 34.0 pg Final  . MCHC 10/03/2018 30.3  30.0 - 36.0 g/dL Final  . RDW  10/03/2018 15.9* 11.5 - 15.5 % Final  . Platelets 10/03/2018 219  150 - 400 K/uL Final  . nRBC 10/03/2018 0.0  0.0 - 0.2 % Final  . Neutrophils Relative % 10/03/2018 27  % Final  . Neutro Abs 10/03/2018 1.4* 1.7 - 7.7 K/uL Final  . Band Neutrophils 10/03/2018 14  % Final  . Lymphocytes Relative 10/03/2018 26  % Final  . Lymphs Abs 10/03/2018 0.9  0.7 - 4.0 K/uL Final  . Monocytes Relative 10/03/2018 18  % Final  . Monocytes Absolute 10/03/2018 0.6  0.1 - 1.0 K/uL Final  . Eosinophils Relative 10/03/2018 1  % Final  . Eosinophils Absolute 10/03/2018 0.0  0.0 - 0.5 K/uL Final  . Basophils Relative 10/03/2018 1  % Final  . Basophils Absolute 10/03/2018 0.0  0.0 - 0.1 K/uL Final  . nRBC 10/03/2018 2* 0 /100 WBC Final  . Metamyelocytes Relative 10/03/2018 5  % Final  . Myelocytes 10/03/2018 8  % Final  . Abs Immature Granulocytes 10/03/2018 0.50* 0.00 - 0.07 K/uL Final  . Tear Drop Cells 10/03/2018 PRESENT   Final  . Polychromasia 10/03/2018 PRESENT   Final   Performed at [facility], [address] [place], [place] [address]    Assessment:  Arthur Bowen is a 78 y.o. male with anemia of chronic renal disease and B12 deficiency. He has focal segmental glomerulosclerosis (FSGS) and due to worsening kidney function, Cellcept was stopped.   He received Procrit 50,000 units monthly through 02/20/2018. Started Retacrit on 08/08/2018 (50,000 units). In March 2020, his counts continued to be low and frequency was increased to every 14 days by Bryan Gray, NP. He last received retacrit 40,000 units on 09/22/2018.   Hemoglobin has been followed. 9.0- (08/14/2018), 7.8- (09/05/2018), 8.3- (09/19/2018). Today, hemoglobin is 8.2 which is slightly worse but overall stable.  He has history of transfusions.  Given hemoglobin today is > 7 and he is asymptomatic, no need for transfusion. MCV 100.7 (slightly elevated).   He has B12 deficiency and continues monthly home b12 injections. Last B12 level was on  08/07/2018- 629 (normal).  Intrinsic factor antibody and antiparietal antibody was negative on 08/06/2018.  Appears to be tolerating injections well without significant   side effect or complication.   He had a large (3.6) lesion removed from his right scapular back area on 08/22/2018.  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  Margins were not clear at that time and was reexcised by dermatology on 10/02/2014.  No suspicion of secondary infection today.   -Continue follow-up with dermatology for continued management.  He has a left renal mass status post CT-guided biopsy on 09/15/2012.  Allergy was negative.  Ultrasound of kidneys on 11/18/2017 revealed a stable 5.1 cm left kidney mass.  Followed by Dr. Brandon asymptomatic today. -Continue to follow-up with Dr. Brandon for continued management.  He was admitted to [facility] 06/05/2018-06/07/2018 for symptomatic bradycardia thought to be due to beta-blocker and symptomatic anemia.  Hemoglobin was 6.4 and he received 2 units of PRBCs.  Work-up revealed ferritin 247, iron saturation 33%, folate normal.  B12 was 311 (was previously 6177 on 10/24/17) and he began B12 injections.  He was again admitted to [facility] on 08/06/2018-08/07/2018.  Hemoglobin was 5.8.  Ferritin was 350. B12 was 629.  Folate was 7.5.  Received 3 units of PRBCs.  Overall symptomatically stable. Labs today reviewed and discussed with patient and his daughter.    Plan: 1. Labs today:  CBC with diff, CMP, hold tube 2. Anemia of chronic kidney disease  Labs reviewed.  Hemoglobin 8.2 (slightly worse), hematocrit 27.1, MCV 100.7, and platelets 285,000.  Review goal hemoglobin of >/= 10 g/dL.   Would consider pRBC transfusion if hemoglobin < 8 and symptomatic or < 7.   No transfusion today  Last received Retacrit 40,000 units on 09/22/2018 with plan for retacrit every 14 days; frequency increased due to anemia refractory to ESA injection  Will receive Retacrit 40,000 unit injection on  10/06/2018  RTC in 2 weeks for labs (CBC, hold tube +/- Retacrit) then again in 4 weeks for labs (CBC, CMP, Hold tube/re-evaluation & +/- Retacrit.  3. CKD-III  Labs reviewed as stable.   BUN 58 and creatinine 2.24 mg/dL.  CrCl 31.95 ml/min (CG)  Managed by Dr. Lateef/nephrology  CellCept discontinued due to declining renal function   Continue to avoid nephrotoxic substances & stay hydrated.   Follow up with nephrology as scheduled 4. B12 deficiency  Continue monthly parenteral b12 supplementation  Continue to monitor. Plan to repeat b12 level in 1 month.  5. Hypertension-    BP elevated in clinic today despite recent increase in hydralazine. Follow up with nephrology, encouraged lifestyle measures.  6. Basal cell carcinoma  Managed by Dr. Graham with recent re-excision of lesion d/t positive margins on initial biopsy. Lesion located on right scapular back.   Follow up with Dr. Graham/dermatology for continued management. 7. We also discussed general [facility] recommendations for identifying COVID-19 risk and what to do if low risk such as handwashing, social distancing, coughing or sneezing into shirt or elbow, and staying at home. Advised patient that if he develops fever, cough, shortness of breath, to contact their healthcare provider before going to clinic and as always contact 911 for medical emergencies.  8. RTC in 2 weeks for labs (CBC with differential, hold tube) and +/- Retacrit. 9. RTC in 4 weeks for MD assessment, labs (CBC with differential, CMP, b12, hold tube), and +/- Retacrit.   I discussed the assessment and treatment plan with the patient. The patient was provided an opportunity to ask questions and all were answered. The patient agreed with the plan and demonstrated an understanding of the instructions.   The patient   was advised to call back or seek an in-person evaluation if the symptoms worsen or if the condition fails to improve as anticipated.  Lauren  Allen, DNP, AGNP-C [facility] [phone] (work cell) [phone] (office)  CC: Dr. Corcoran  

## 2018-10-03 ENCOUNTER — Inpatient Hospital Stay: Payer: Medicare Other

## 2018-10-03 ENCOUNTER — Other Ambulatory Visit: Payer: Medicare Other

## 2018-10-03 ENCOUNTER — Ambulatory Visit: Payer: Medicare Other | Admitting: Hematology and Oncology

## 2018-10-03 ENCOUNTER — Encounter: Payer: Self-pay | Admitting: Nurse Practitioner

## 2018-10-03 ENCOUNTER — Inpatient Hospital Stay: Payer: Medicare Other | Attending: Hematology and Oncology

## 2018-10-03 ENCOUNTER — Other Ambulatory Visit: Payer: Self-pay | Admitting: Nephrology

## 2018-10-03 ENCOUNTER — Inpatient Hospital Stay (HOSPITAL_BASED_OUTPATIENT_CLINIC_OR_DEPARTMENT_OTHER): Payer: Medicare Other | Admitting: Nurse Practitioner

## 2018-10-03 ENCOUNTER — Ambulatory Visit: Payer: Medicare Other

## 2018-10-03 ENCOUNTER — Other Ambulatory Visit: Payer: Self-pay

## 2018-10-03 VITALS — BP 160/80 | HR 84 | Temp 99.0°F | Resp 18

## 2018-10-03 DIAGNOSIS — Z87891 Personal history of nicotine dependence: Secondary | ICD-10-CM | POA: Diagnosis not present

## 2018-10-03 DIAGNOSIS — N183 Chronic kidney disease, stage 3 (moderate): Secondary | ICD-10-CM | POA: Diagnosis not present

## 2018-10-03 DIAGNOSIS — Z7982 Long term (current) use of aspirin: Secondary | ICD-10-CM | POA: Diagnosis not present

## 2018-10-03 DIAGNOSIS — N184 Chronic kidney disease, stage 4 (severe): Principal | ICD-10-CM

## 2018-10-03 DIAGNOSIS — D631 Anemia in chronic kidney disease: Secondary | ICD-10-CM

## 2018-10-03 DIAGNOSIS — Z79899 Other long term (current) drug therapy: Secondary | ICD-10-CM | POA: Diagnosis not present

## 2018-10-03 DIAGNOSIS — E538 Deficiency of other specified B group vitamins: Secondary | ICD-10-CM

## 2018-10-03 DIAGNOSIS — R319 Hematuria, unspecified: Secondary | ICD-10-CM

## 2018-10-03 LAB — COMPREHENSIVE METABOLIC PANEL
ALT: 18 U/L (ref 0–44)
AST: 22 U/L (ref 15–41)
Albumin: 2.3 g/dL — ABNORMAL LOW (ref 3.5–5.0)
Alkaline Phosphatase: 70 U/L (ref 38–126)
Anion gap: 4 — ABNORMAL LOW (ref 5–15)
BUN: 58 mg/dL — ABNORMAL HIGH (ref 8–23)
CO2: 20 mmol/L — ABNORMAL LOW (ref 22–32)
Calcium: 8.1 mg/dL — ABNORMAL LOW (ref 8.9–10.3)
Chloride: 113 mmol/L — ABNORMAL HIGH (ref 98–111)
Creatinine, Ser: 2.24 mg/dL — ABNORMAL HIGH (ref 0.61–1.24)
GFR calc Af Amer: 31 mL/min — ABNORMAL LOW (ref >60)
GFR calc non Af Amer: 27 mL/min — ABNORMAL LOW (ref >60)
Glucose, Bld: 103 mg/dL — ABNORMAL HIGH (ref 70–99)
Potassium: 4.3 mmol/L (ref 3.5–5.1)
Sodium: 137 mmol/L (ref 135–145)
Total Bilirubin: 0.7 mg/dL (ref 0.3–1.2)
Total Protein: 4.5 g/dL — ABNORMAL LOW (ref 6.5–8.1)

## 2018-10-03 LAB — CBC WITH DIFFERENTIAL/PLATELET
Abs Immature Granulocytes: 0.5 10*3/uL — ABNORMAL HIGH (ref 0.00–0.07)
Band Neutrophils: 14 %
Basophils Absolute: 0 10*3/uL (ref 0.0–0.1)
Basophils Relative: 1 %
Eosinophils Absolute: 0 10*3/uL (ref 0.0–0.5)
Eosinophils Relative: 1 %
HCT: 27.1 % — ABNORMAL LOW (ref 39.0–52.0)
Hemoglobin: 8.2 g/dL — ABNORMAL LOW (ref 13.0–17.0)
Lymphocytes Relative: 26 %
Lymphs Abs: 0.9 10*3/uL (ref 0.7–4.0)
MCH: 30.5 pg (ref 26.0–34.0)
MCHC: 30.3 g/dL (ref 30.0–36.0)
MCV: 100.7 fL — ABNORMAL HIGH (ref 80.0–100.0)
Metamyelocytes Relative: 5 %
Monocytes Absolute: 0.6 10*3/uL (ref 0.1–1.0)
Monocytes Relative: 18 %
Myelocytes: 8 %
Neutro Abs: 1.4 10*3/uL — ABNORMAL LOW (ref 1.7–7.7)
Neutrophils Relative %: 27 %
Platelets: 219 10*3/uL (ref 150–400)
RBC: 2.69 MIL/uL — ABNORMAL LOW (ref 4.22–5.81)
RDW: 15.9 % — ABNORMAL HIGH (ref 11.5–15.5)
WBC: 3.5 10*3/uL — ABNORMAL LOW (ref 4.0–10.5)
nRBC: 0 % (ref 0.0–0.2)
nRBC: 2 /100 WBC — ABNORMAL HIGH

## 2018-10-03 LAB — SAMPLE TO BLOOD BANK

## 2018-10-03 NOTE — Patient Instructions (Signed)
Please come back to clinic on: 4/20 at 1:15 for retacrit.  10/20/2018 at 1:00 for repeat lab & retacrit. 5/18 at 1:15 for lab & to see Dr. Mike Gip.   Please call the clinic if you have any questions or concerns. Beckey Rutter, NP

## 2018-10-06 ENCOUNTER — Other Ambulatory Visit: Payer: Self-pay

## 2018-10-06 ENCOUNTER — Inpatient Hospital Stay: Payer: Medicare Other

## 2018-10-06 VITALS — BP 152/80 | Temp 97.8°F | Resp 18

## 2018-10-06 DIAGNOSIS — I129 Hypertensive chronic kidney disease with stage 1 through stage 4 chronic kidney disease, or unspecified chronic kidney disease: Secondary | ICD-10-CM

## 2018-10-06 DIAGNOSIS — Z79899 Other long term (current) drug therapy: Secondary | ICD-10-CM | POA: Diagnosis not present

## 2018-10-06 DIAGNOSIS — Z7982 Long term (current) use of aspirin: Secondary | ICD-10-CM | POA: Diagnosis not present

## 2018-10-06 DIAGNOSIS — N184 Chronic kidney disease, stage 4 (severe): Secondary | ICD-10-CM

## 2018-10-06 DIAGNOSIS — D649 Anemia, unspecified: Secondary | ICD-10-CM

## 2018-10-06 DIAGNOSIS — N183 Chronic kidney disease, stage 3 (moderate): Secondary | ICD-10-CM | POA: Diagnosis not present

## 2018-10-06 DIAGNOSIS — D631 Anemia in chronic kidney disease: Secondary | ICD-10-CM | POA: Diagnosis not present

## 2018-10-06 DIAGNOSIS — Z87891 Personal history of nicotine dependence: Secondary | ICD-10-CM | POA: Diagnosis not present

## 2018-10-06 MED ORDER — EPOETIN ALFA-EPBX 40000 UNIT/ML IJ SOLN
40000.0000 [IU] | Freq: Once | INTRAMUSCULAR | Status: AC
  Administered 2018-10-06: 40000 [IU] via SUBCUTANEOUS

## 2018-10-06 NOTE — Patient Instructions (Signed)
Epoetin Alfa injection °What is this medicine? °EPOETIN ALFA (e POE e tin AL fa) helps your body make more red blood cells. This medicine is used to treat anemia caused by chronic kidney disease, cancer chemotherapy, or HIV-therapy. It may also be used before surgery if you have anemia. °This medicine may be used for other purposes; ask your health care provider or pharmacist if you have questions. °COMMON BRAND NAME(S): Epogen, Procrit, Retacrit °What should I tell my health care provider before I take this medicine? °They need to know if you have any of these conditions: °-cancer °-heart disease °-high blood pressure °-history of blood clots °-history of stroke °-low levels of folate, iron, or vitamin B12 in the blood °-seizures °-an unusual or allergic reaction to erythropoietin, albumin, benzyl alcohol, hamster proteins, other medicines, foods, dyes, or preservatives °-pregnant or trying to get pregnant °-breast-feeding °How should I use this medicine? °This medicine is for injection into a vein or under the skin. It is usually given by a health care professional in a hospital or clinic setting. °If you get this medicine at home, you will be taught how to prepare and give this medicine. Use exactly as directed. Take your medicine at regular intervals. Do not take your medicine more often than directed. °It is important that you put your used needles and syringes in a special sharps container. Do not put them in a trash can. If you do not have a sharps container, call your pharmacist or healthcare provider to get one. °A special MedGuide will be given to you by the pharmacist with each prescription and refill. Be sure to read this information carefully each time. °Talk to your pediatrician regarding the use of this medicine in children. While this drug may be prescribed for selected conditions, precautions do apply. °Overdosage: If you think you have taken too much of this medicine contact a poison control center  or emergency room at once. °NOTE: This medicine is only for you. Do not share this medicine with others. °What if I miss a dose? °If you miss a dose, take it as soon as you can. If it is almost time for your next dose, take only that dose. Do not take double or extra doses. °What may interact with this medicine? °Interactions have not been studied. °This list may not describe all possible interactions. Give your health care provider a list of all the medicines, herbs, non-prescription drugs, or dietary supplements you use. Also tell them if you smoke, drink alcohol, or use illegal drugs. Some items may interact with your medicine. °What should I watch for while using this medicine? °Your condition will be monitored carefully while you are receiving this medicine. °You may need blood work done while you are taking this medicine. °This medicine may cause a decrease in vitamin B6. You should make sure that you get enough vitamin B6 while you are taking this medicine. Discuss the foods you eat and the vitamins you take with your health care professional. °What side effects may I notice from receiving this medicine? °Side effects that you should report to your doctor or health care professional as soon as possible: °-allergic reactions like skin rash, itching or hives, swelling of the face, lips, or tongue °-seizures °-signs and symptoms of a blood clot such as breathing problems; changes in vision; chest pain; severe, sudden headache; pain, swelling, warmth in the leg; trouble speaking; sudden numbness or weakness of the face, arm or leg °-signs and symptoms of a stroke   like changes in vision; confusion; trouble speaking or understanding; severe headaches; sudden numbness or weakness of the face, arm or leg; trouble walking; dizziness; loss of balance or coordination °Side effects that usually do not require medical attention (report to your doctor or health care professional if they continue or are  bothersome): °-chills °-cough °-dizziness °-fever °-headaches °-joint pain °-muscle cramps °-muscle pain °-nausea, vomiting °-pain, redness, or irritation at site where injected °This list may not describe all possible side effects. Call your doctor for medical advice about side effects. You may report side effects to FDA at 1-800-FDA-1088. °Where should I keep my medicine? °Keep out of the reach of children. °Store in a refrigerator between 2 and 8 degrees C (36 and 46 degrees F). Do not freeze or shake. Throw away any unused portion if using a single-dose vial. Multi-dose vials can be kept in the refrigerator for up to 21 days after the initial dose. Throw away unused medicine. °NOTE: This sheet is a summary. It may not cover all possible information. If you have questions about this medicine, talk to your doctor, pharmacist, or health care provider. °© 2019 [employer] (2017-01-11 08:35:19) ° °

## 2018-10-10 ENCOUNTER — Ambulatory Visit
Admission: RE | Admit: 2018-10-10 | Discharge: 2018-10-10 | Disposition: A | Discharge status: Home / Self Care | Payer: Medicare Other | Source: Ambulatory Visit | Attending: Nephrology | Admitting: Nephrology

## 2018-10-10 ENCOUNTER — Other Ambulatory Visit: Payer: Self-pay

## 2018-10-10 DIAGNOSIS — K8020 Calculus of gallbladder without cholecystitis without obstruction: Secondary | ICD-10-CM | POA: Diagnosis not present

## 2018-10-10 DIAGNOSIS — R319 Hematuria, unspecified: Secondary | ICD-10-CM | POA: Diagnosis not present

## 2018-10-10 DIAGNOSIS — N2889 Other specified disorders of kidney and ureter: Secondary | ICD-10-CM | POA: Diagnosis not present

## 2018-10-16 DIAGNOSIS — L905 Scar conditions and fibrosis of skin: Secondary | ICD-10-CM | POA: Diagnosis not present

## 2018-10-16 DIAGNOSIS — L03312 Cellulitis of back [any part except buttock]: Secondary | ICD-10-CM | POA: Diagnosis not present

## 2018-10-24 DIAGNOSIS — I10 Essential (primary) hypertension: Secondary | ICD-10-CM | POA: Diagnosis not present

## 2018-10-24 DIAGNOSIS — N041 Nephrotic syndrome with focal and segmental glomerular lesions: Secondary | ICD-10-CM | POA: Diagnosis not present

## 2018-10-24 DIAGNOSIS — N183 Chronic kidney disease, stage 3 (moderate): Secondary | ICD-10-CM | POA: Diagnosis not present

## 2018-10-24 DIAGNOSIS — D631 Anemia in chronic kidney disease: Secondary | ICD-10-CM | POA: Diagnosis not present

## 2018-10-27 ENCOUNTER — Telehealth: Payer: Self-pay | Admitting: Urology

## 2018-10-27 NOTE — Telephone Encounter (Signed)
Thanks for pointing this out.  CT is fine, please have him keep follow up to discuss.  Hollice Espy, MD

## 2018-10-27 NOTE — Telephone Encounter (Signed)
Patient's daughter called and wants to know if it is necessary for him to have the RUS? She states that he just had a CT scan and due to financial reason's he can not afford to have the [place]. Please advise   Sharyn Lull

## 2018-10-28 NOTE — Telephone Encounter (Signed)
I called and spoke to pt's Daughter Tammy, I told her not to worry about the [place] but to keep the follow up appt. She was fine with that.

## 2018-11-05 ENCOUNTER — Other Ambulatory Visit: Payer: Self-pay

## 2018-11-05 NOTE — Progress Notes (Signed)
[facility]  [address], [address] [place], [place] [address] Phone: [phone]  Fax: [phone]   Clinic Day:  11/06/2018  Referring physician: Glean Hess, MD  Chief Complaint: Arthur Bowen is a 78 y.o. male with anemia of chronic kidney disease and B12 deficiency who is seen for 1 month assessment   HPI: The patient was last seen in the hematology clinic on 10/03/2018 by Beckey Rutter, NP. At that time, he was doing well. Energy was stable. He denied any bleeding. He had minimal dyspnea on exertion doing yard work. Weight was up 1lb. He did not receive a transfusion. Hemoglobin was 8.2, hematocrit 27.1. Creatinine was 2.24. BUN 58.   He received Retacrit on 10/06/2018.  Abdomen and pelvic CTon 10/10/2018 revealed cholelithiasis without inflammation. There was no hydronephrosis, renal obstruction, or renal or ureteral calculi noted. There was a stable 4.8 cm complex and peripherally calcified exophytic lesion in the midpole of left kidney, consistent with benign complex or proteinaceous cyst. There was mild prostatic enlargement.  During the interim, he reports he is doing "pretty good." He denies any shortness of breath or cough. He urinates 2-3x at night. He bruises easily.   He reports the basal cell carcinoma on his back was reexcised in April. He has pain when moving his arm. He reports it itches occasionally.   His creatinine is 2.56 today, up from 2.24 one month ago. He is followed by Dr. Holley Raring in nephrology.    Past Medical History:  Diagnosis Date   Acquired phimosis    BPH (benign prostatic hypertrophy)    Chronic kidney disease    Dyspnea    with exertion   Elevated PSA    Gross hematuria    Hematuria    History of hiatal hernia    Hypertension    Myocardial infarction [facility])    CABG-triple   Renal mass, left     Past Surgical History:  Procedure Laterality Date   Circumscision  09/2015   CORONARY ARTERY BYPASS  GRAFT  2002   x3 @ [place] Left 2011   RENAL BIOPSY  2014   neg in w/u renal mass/hematuria    Family History  Problem Relation Age of Onset   Lymphoma Mother    Hodgkin's lymphoma Mother    Lung cancer Father    Heart disease Brother    Dementia Sister    Dementia Brother    Breast cancer Maternal Aunt     Social History:  reports that he quit smoking about 12 months ago. His smoking use included cigarettes. He has a 25.00 pack-year smoking history. He has quit using smokeless tobacco.  His smokeless tobacco use included chew. He reports that he does not drink alcohol or use drugs.The patient is alone today.  Allergies:  Allergies  Allergen Reactions   Penicillins Other (See Comments)   Sulfa Antibiotics Other (See Comments)    Current Medications: Current Outpatient Medications  Medication Sig Dispense Refill   amLODipine (NORVASC) 5 MG tablet Take 1 tablet (5 mg total) by mouth daily. 90 tablet 1   aspirin 81 MG tablet Take 1 tablet by mouth daily. Reported on 09/02/2015     atorvastatin (LIPITOR) 40 MG tablet TAKE 1 TABLET BY MOUTH ONCE DAILY (Patient taking differently: Take 20 mg by mouth daily at 6 PM. ) 90 tablet 3   calcitRIOL (ROCALTROL) 0.25 MCG capsule Take 0.25 mcg by mouth daily.  Cyanocobalamin (B-12) 1000 MCG/ML KIT Inject 1,000 mcg as directed every 30 (thirty) days. 1 kit 6   Cyanocobalamin (VITAMIN B 12 PO) Take 1,000 mcg by mouth daily.      finasteride (PROSCAR) 5 MG tablet Take 1 tablet by mouth once daily 90 tablet 3   fluticasone (FLONASE) 50 MCG/ACT nasal spray Place 2 sprays into both nostrils daily. (Patient taking differently: Place 2 sprays into both nostrils as needed. ) 16 g 5   hydrALAZINE (APRESOLINE) 50 MG tablet Take 75 mg by mouth 3 (three) times daily.     lisinopril (PRINIVIL,ZESTRIL) 20 MG tablet Take 20 mg by mouth daily.      nitroGLYCERIN (NITROSTAT) 0.4 MG SL tablet Place 1 tablet (0.4  mg total) under the tongue every 5 (five) minutes as needed for chest pain. 50 tablet 3   predniSONE (DELTASONE) 10 MG tablet Take 10 mg by mouth daily with breakfast.      triamcinolone cream (KENALOG) 0.1 % as needed.      No current facility-administered medications for this visit.     Review of Systems  Constitutional: Negative.  Negative for chills, diaphoresis, fever, malaise/fatigue and weight loss.       Doing "pretty good." Up 2lbs.  HENT: Negative for congestion, ear pain, hearing loss, nosebleeds and sinus pain.   Eyes: Negative.  Negative for blurred vision, double vision and photophobia.  Respiratory: Negative.  Negative for cough, hemoptysis, sputum production and shortness of breath.   Cardiovascular: Negative.  Negative for chest pain, palpitations, orthopnea, leg swelling and PND.  Gastrointestinal: Negative.  Negative for abdominal pain, blood in stool, constipation, diarrhea, melena, nausea and vomiting.  Genitourinary: Negative for dysuria, frequency, hematuria and urgency.       Nocturia, 2-3x nightly  Musculoskeletal: Negative for back pain, falls, joint pain and myalgias.       Pain when moving his arm.   Skin: Positive for itching (at site of excised nodule on back). Negative for rash.       Re-excision of nodule performed on 10/02/2018 & tender/bandage overlying   Neurological: Negative.  Negative for dizziness, tremors, speech change, focal weakness, weakness and headaches.  Endo/Heme/Allergies: Bruises/bleeds easily (on daily ASA).  Psychiatric/Behavioral: Negative.  Negative for depression and memory loss. The patient is not nervous/anxious and does not have insomnia.   All other systems reviewed and are negative.  Performance status (ECOG): 1  Blood pressure (!) 162/80, pulse 75, temperature 98.1 F (36.7 C), temperature source Oral, resp. rate 18, weight 191 lb 11 oz (87 kg), SpO2 100 %.  Physical Exam  Constitutional: He is oriented to person, place,  and time. He appears well-developed and well-nourished. No distress.  HENT:  Head: Normocephalic and atraumatic.  Mouth/Throat: Oropharynx is clear and moist. No oropharyngeal exudate.  White hair. Male pattern baldness.  Mask.  Eyes: Pupils are equal, round, and reactive to light. Conjunctivae and EOM are normal. No scleral icterus.  Glasses.  Neck: Normal range of motion. Neck supple. No JVD present.  Cardiovascular: Normal rate, regular rhythm and normal heart sounds.  No murmur heard. Pulmonary/Chest: Effort normal and breath sounds normal. No respiratory distress. He has no wheezes.  Abdominal: Soft. Bowel sounds are normal. He exhibits no distension and no mass. There is no abdominal tenderness. There is no rebound and no guarding.  Musculoskeletal: Normal range of motion.        General: No edema.  Lymphadenopathy:    He has no cervical adenopathy.  He has no axillary adenopathy.       Right: No supraclavicular adenopathy present.       Left: No supraclavicular adenopathy present.  Neurological: He is alert and oriented to person, place, and time.  Skin: Skin is warm and dry. Bruising (scattered) noted. No rash noted. He is not diaphoretic. No erythema. No pallor.  Right scapular back wound 3cm x 0.8cm.  No increased warmth or purulence.  Psychiatric: He has a normal mood and affect. His behavior is normal. Judgment and thought content normal.  Nursing note and vitals reviewed.            Back  Appointment on 11/06/2018  Component Date Value Ref Range Status   Sodium 11/06/2018 141  135 - 145 mmol/L Final   Potassium 11/06/2018 4.4  3.5 - 5.1 mmol/L Final   Chloride 11/06/2018 111  98 - 111 mmol/L Final   CO2 11/06/2018 24  22 - 32 mmol/L Final   Glucose, Bld 11/06/2018 91  70 - 99 mg/dL Final   BUN 11/06/2018 47* 8 - 23 mg/dL Final   Creatinine, Ser 11/06/2018 2.56* 0.61 - 1.24 mg/dL Final   Calcium 11/06/2018 8.3* 8.9 - 10.3 mg/dL Final   Total Protein  11/06/2018 5.2* 6.5 - 8.1 g/dL Final   Albumin 11/06/2018 2.6* 3.5 - 5.0 g/dL Final   AST 11/06/2018 19  15 - 41 U/L Final   ALT 11/06/2018 25  0 - 44 U/L Final   Alkaline Phosphatase 11/06/2018 75  38 - 126 U/L Final   Total Bilirubin 11/06/2018 0.6  0.3 - 1.2 mg/dL Final   GFR calc non Af Amer 11/06/2018 23* >60 mL/min Final   GFR calc Af Amer 11/06/2018 26* >60 mL/min Final   Anion gap 11/06/2018 6  5 - 15 Final   Performed at [facility], [address] [place], [place] [address]   WBC 11/06/2018 11.1* 4.0 - 10.5 K/uL Final   RBC 11/06/2018 3.30* 4.22 - 5.81 MIL/uL Final   Hemoglobin 11/06/2018 9.6* 13.0 - 17.0 g/dL Final   HCT 11/06/2018 31.5* 39.0 - 52.0 % Final   MCV 11/06/2018 95.5  80.0 - 100.0 fL Final   MCH 11/06/2018 29.1  26.0 - 34.0 pg Final   MCHC 11/06/2018 30.5  30.0 - 36.0 g/dL Final   RDW 11/06/2018 15.1  11.5 - 15.5 % Final   Platelets 11/06/2018 235  150 - 400 K/uL Final   nRBC 11/06/2018 0.0  0.0 - 0.2 % Final   Neutrophils Relative % 11/06/2018 64  % Final   Neutro Abs 11/06/2018 7.2  1.7 - 7.7 K/uL Final   Lymphocytes Relative 11/06/2018 27  % Final   Lymphs Abs 11/06/2018 2.9  0.7 - 4.0 K/uL Final   Monocytes Relative 11/06/2018 7  % Final   Monocytes Absolute 11/06/2018 0.8  0.1 - 1.0 K/uL Final   Eosinophils Relative 11/06/2018 1  % Final   Eosinophils Absolute 11/06/2018 0.1  0.0 - 0.5 K/uL Final   Basophils Relative 11/06/2018 0  % Final   Basophils Absolute 11/06/2018 0.0  0.0 - 0.1 K/uL Final   Immature Granulocytes 11/06/2018 1  % Final   Abs Immature Granulocytes 11/06/2018 0.07  0.00 - 0.07 K/uL Final   Performed at [facility], [address], [place], [place] [address]    Assessment:  TAITUM ALMS is a 78 y.o. male with anemia of chronic renal disease and B12 deficiency. He has focal segmental  glomerulosclerosis (FSGS) and due to worsening kidney function, Cellcept was stopped.   He  received Procrit 50,000 units monthly through 02/20/2018. Started Retacrit on 08/08/2018 (50,000 units). In March 2020, his counts continued to be low and frequency was increased to every 14 days by Honor Loh, NP. He last received retacrit 40,000 units on 09/22/2018.   Hemoglobin has been followed. 9.0- (08/14/2018), 7.8- (09/05/2018), 8.3- (09/19/2018). Today, hemoglobin is 8.2 which is slightly worse but overall stable.  He has history of transfusions.  Given hemoglobin today is > 7 and he is asymptomatic, no need for transfusion. MCV 100.7 (slightly elevated).   He has B12 deficiency and continues monthly home b12 injections. Last B12 level was on 08/07/2018- 629 (normal).  Intrinsic factor antibody and antiparietal antibody was negative on 08/06/2018.  Appears to be tolerating injections well without significant side effect or complication.   He had a large (3.6) lesion removed from his right scapular back area on 08/22/2018.  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  Margins were not clear at that time and was reexcised by dermatology on 10/02/2014.  No suspicion of secondary infection today.   -Continue follow-up with dermatology for continued management.  He has a left renal mass status post CT-guided biopsy on 09/15/2012.  Allergy was negative.  Ultrasound of kidneys on 11/18/2017 revealed a stable 5.1 cm left kidney mass.  Followed by Dr. Erlene Quan asymptomatic today. -Continue to follow-up with Dr. Erlene Quan for continued management.  He was admitted to [facility] 06/05/2018-06/07/2018 for symptomatic bradycardia thought to be due to beta-blocker and symptomatic anemia.  Hemoglobin was 6.4 and he received 2 units of PRBCs.  Work-up revealed ferritin 247, iron saturation 33%, folate normal.  B12 was 311 (was previously 6177 on 10/24/17) and he began B12 injections.  He was again admitted to [facility] on 08/06/2018-08/07/2018 for anemia.  Hemoglobin was 5.8.  Ferritin was 350. B12 was 629.  Folate was 7.5.   Received 3 units of PRBCs.  Symptomatically, he is doing well.  He is s/p re-excision of a basal cell carcinoma from his back in 09/2018.  Plan: 1. Labs today:  CBC with diff, CMP, hold tube 2. Anemia of chronic kidney disease  Hemoglobin 9.6, hematocrit 31.5, MCV 95.5, and platelets 235,000. Hemoglobin goal is  >/= 10 g/dL.  PRBC transfusion if hemoglobin < 8 and symptomatic or < 7.  No Retacrit today. 3. Stage III chronic kidney disease  Labs reviewed .  BUN 47 and creatinine 2.56. Review creatinine trend.  2.04 to 2.24 to 2.56  Follow-up with Dr. Holley Raring. 4. B12 deficiency Continue monthly B12. Check folate periodically. 5. Basal cell carcinoma Status post reexcision basal cell carcinoma overlying right scapula Follow-up with Dr. Phillip Heal for continued management.  6.   RTC in 2 weeks for CBC +/- Retacrit. 7.   RTC in 6 weeks for CBC +/- Retacrit 8.   RTC in 10 weeks for MD assessment, labs (CBC with diff, BMP, ferritin), +/- Retacrit.    I discussed the assessment and treatment plan with the patient.  The patient was provided an opportunity to ask questions and all were answered.  The patient agreed with the plan and demonstrated an understanding of the instructions.  The patient was advised to call back if the symptoms worsen or if the condition fails to improve as anticipated.  I provided 15 minutes of face-to-face time during this this encounter and > 50% was spent counseling as documented under my assessment and plan.  Lequita Asal, MD, PhD    11/06/2018, 9:37 AM  I, Cloyde Reams Dorshimer, am acting as Education administrator for [employer]. Mike Gip, MD, PhD.  I, Sabina Beavers C. Mike Gip, MD, have reviewed the above documentation for accuracy and completeness, and I agree with the above.

## 2018-11-06 ENCOUNTER — Other Ambulatory Visit: Payer: Self-pay | Admitting: Hematology and Oncology

## 2018-11-06 ENCOUNTER — Inpatient Hospital Stay: Payer: Medicare Other

## 2018-11-06 ENCOUNTER — Inpatient Hospital Stay: Payer: Medicare Other | Attending: Hematology and Oncology | Admitting: Hematology and Oncology

## 2018-11-06 ENCOUNTER — Encounter: Payer: Self-pay | Admitting: Hematology and Oncology

## 2018-11-06 VITALS — BP 162/80 | HR 75 | Temp 98.1°F | Resp 18 | Wt 191.7 lb

## 2018-11-06 DIAGNOSIS — N183 Chronic kidney disease, stage 3 (moderate): Secondary | ICD-10-CM | POA: Diagnosis not present

## 2018-11-06 DIAGNOSIS — E538 Deficiency of other specified B group vitamins: Secondary | ICD-10-CM

## 2018-11-06 DIAGNOSIS — N184 Chronic kidney disease, stage 4 (severe): Secondary | ICD-10-CM

## 2018-11-06 DIAGNOSIS — Z8603 Personal history of neoplasm of uncertain behavior: Secondary | ICD-10-CM | POA: Diagnosis not present

## 2018-11-06 DIAGNOSIS — D631 Anemia in chronic kidney disease: Secondary | ICD-10-CM | POA: Diagnosis not present

## 2018-11-06 LAB — COMPREHENSIVE METABOLIC PANEL
ALT: 25 U/L (ref 0–44)
AST: 19 U/L (ref 15–41)
Albumin: 2.6 g/dL — ABNORMAL LOW (ref 3.5–5.0)
Alkaline Phosphatase: 75 U/L (ref 38–126)
Anion gap: 6 (ref 5–15)
BUN: 47 mg/dL — ABNORMAL HIGH (ref 8–23)
CO2: 24 mmol/L (ref 22–32)
Calcium: 8.3 mg/dL — ABNORMAL LOW (ref 8.9–10.3)
Chloride: 111 mmol/L (ref 98–111)
Creatinine, Ser: 2.56 mg/dL — ABNORMAL HIGH (ref 0.61–1.24)
GFR calc Af Amer: 26 mL/min — ABNORMAL LOW (ref >60)
GFR calc non Af Amer: 23 mL/min — ABNORMAL LOW (ref >60)
Glucose, Bld: 91 mg/dL (ref 70–99)
Potassium: 4.4 mmol/L (ref 3.5–5.1)
Sodium: 141 mmol/L (ref 135–145)
Total Bilirubin: 0.6 mg/dL (ref 0.3–1.2)
Total Protein: 5.2 g/dL — ABNORMAL LOW (ref 6.5–8.1)

## 2018-11-06 LAB — CBC WITH DIFFERENTIAL/PLATELET
Abs Immature Granulocytes: 0.07 10*3/uL (ref 0.00–0.07)
Basophils Absolute: 0 10*3/uL (ref 0.0–0.1)
Basophils Relative: 0 %
Eosinophils Absolute: 0.1 10*3/uL (ref 0.0–0.5)
Eosinophils Relative: 1 %
HCT: 31.5 % — ABNORMAL LOW (ref 39.0–52.0)
Hemoglobin: 9.6 g/dL — ABNORMAL LOW (ref 13.0–17.0)
Immature Granulocytes: 1 %
Lymphocytes Relative: 27 %
Lymphs Abs: 2.9 10*3/uL (ref 0.7–4.0)
MCH: 29.1 pg (ref 26.0–34.0)
MCHC: 30.5 g/dL (ref 30.0–36.0)
MCV: 95.5 fL (ref 80.0–100.0)
Monocytes Absolute: 0.8 10*3/uL (ref 0.1–1.0)
Monocytes Relative: 7 %
Neutro Abs: 7.2 10*3/uL (ref 1.7–7.7)
Neutrophils Relative %: 64 %
Platelets: 235 10*3/uL (ref 150–400)
RBC: 3.3 MIL/uL — ABNORMAL LOW (ref 4.22–5.81)
RDW: 15.1 % (ref 11.5–15.5)
WBC: 11.1 10*3/uL — ABNORMAL HIGH (ref 4.0–10.5)
nRBC: 0 % (ref 0.0–0.2)

## 2018-11-06 LAB — FOLATE: Folate: 10.1 ng/mL (ref >5.9)

## 2018-11-06 LAB — FERRITIN: Ferritin: 219 ng/mL (ref 24–336)

## 2018-11-06 LAB — SAMPLE TO BLOOD BANK

## 2018-11-06 LAB — VITAMIN B12: Vitamin B-12: 1001 pg/mL — ABNORMAL HIGH (ref 180–914)

## 2018-11-06 NOTE — Progress Notes (Signed)
Pt here for follow up. Denies any concerns at this time.  

## 2018-11-14 ENCOUNTER — Ambulatory Visit: Payer: Medicare Other | Admitting: Urology

## 2018-11-16 ENCOUNTER — Other Ambulatory Visit: Payer: Self-pay | Admitting: Internal Medicine

## 2018-11-16 DIAGNOSIS — E785 Hyperlipidemia, unspecified: Secondary | ICD-10-CM

## 2018-11-17 DIAGNOSIS — R809 Proteinuria, unspecified: Secondary | ICD-10-CM | POA: Diagnosis not present

## 2018-11-17 DIAGNOSIS — N041 Nephrotic syndrome with focal and segmental glomerular lesions: Secondary | ICD-10-CM | POA: Diagnosis not present

## 2018-11-17 DIAGNOSIS — I10 Essential (primary) hypertension: Secondary | ICD-10-CM | POA: Diagnosis not present

## 2018-11-17 DIAGNOSIS — N183 Chronic kidney disease, stage 3 (moderate): Secondary | ICD-10-CM | POA: Diagnosis not present

## 2018-11-17 DIAGNOSIS — D631 Anemia in chronic kidney disease: Secondary | ICD-10-CM | POA: Diagnosis not present

## 2018-11-20 ENCOUNTER — Other Ambulatory Visit: Payer: Medicare Other

## 2018-11-20 ENCOUNTER — Ambulatory Visit: Payer: Medicare Other

## 2018-11-25 ENCOUNTER — Other Ambulatory Visit: Payer: Self-pay

## 2018-11-25 ENCOUNTER — Inpatient Hospital Stay: Payer: Medicare Other | Attending: Hematology and Oncology

## 2018-11-25 ENCOUNTER — Inpatient Hospital Stay: Payer: Medicare Other

## 2018-11-25 VITALS — BP 110/65 | Resp 20

## 2018-11-25 DIAGNOSIS — I129 Hypertensive chronic kidney disease with stage 1 through stage 4 chronic kidney disease, or unspecified chronic kidney disease: Secondary | ICD-10-CM

## 2018-11-25 DIAGNOSIS — D631 Anemia in chronic kidney disease: Secondary | ICD-10-CM | POA: Diagnosis not present

## 2018-11-25 DIAGNOSIS — N183 Chronic kidney disease, stage 3 (moderate): Secondary | ICD-10-CM | POA: Diagnosis not present

## 2018-11-25 DIAGNOSIS — N184 Chronic kidney disease, stage 4 (severe): Secondary | ICD-10-CM

## 2018-11-25 DIAGNOSIS — D649 Anemia, unspecified: Secondary | ICD-10-CM

## 2018-11-25 LAB — CBC WITH DIFFERENTIAL/PLATELET
Abs Immature Granulocytes: 0.08 10*3/uL — ABNORMAL HIGH (ref 0.00–0.07)
Basophils Absolute: 0 10*3/uL (ref 0.0–0.1)
Basophils Relative: 0 %
Eosinophils Absolute: 0 10*3/uL (ref 0.0–0.5)
Eosinophils Relative: 0 %
HCT: 28.9 % — ABNORMAL LOW (ref 39.0–52.0)
Hemoglobin: 8.7 g/dL — ABNORMAL LOW (ref 13.0–17.0)
Immature Granulocytes: 1 %
Lymphocytes Relative: 16 %
Lymphs Abs: 1.7 10*3/uL (ref 0.7–4.0)
MCH: 28.2 pg (ref 26.0–34.0)
MCHC: 30.1 g/dL (ref 30.0–36.0)
MCV: 93.5 fL (ref 80.0–100.0)
Monocytes Absolute: 0.3 10*3/uL (ref 0.1–1.0)
Monocytes Relative: 3 %
Neutro Abs: 8.7 10*3/uL — ABNORMAL HIGH (ref 1.7–7.7)
Neutrophils Relative %: 80 %
Platelets: 342 10*3/uL (ref 150–400)
RBC: 3.09 MIL/uL — ABNORMAL LOW (ref 4.22–5.81)
RDW: 15.3 % (ref 11.5–15.5)
WBC: 10.8 10*3/uL — ABNORMAL HIGH (ref 4.0–10.5)
nRBC: 0 % (ref 0.0–0.2)

## 2018-11-25 LAB — SAMPLE TO BLOOD BANK

## 2018-11-25 MED ORDER — EPOETIN ALFA-EPBX 40000 UNIT/ML IJ SOLN
40000.0000 [IU] | Freq: Once | INTRAMUSCULAR | Status: AC
  Administered 2018-11-25: 14:00:00 40000 [IU] via SUBCUTANEOUS

## 2018-11-25 NOTE — Patient Instructions (Signed)
Epoetin Alfa injection °What is this medicine? °EPOETIN ALFA (e POE e tin AL fa) helps your body make more red blood cells. This medicine is used to treat anemia caused by chronic kidney disease, cancer chemotherapy, or HIV-therapy. It may also be used before surgery if you have anemia. °This medicine may be used for other purposes; ask your health care provider or pharmacist if you have questions. °COMMON BRAND NAME(S): Epogen, Procrit, Retacrit °What should I tell my health care provider before I take this medicine? °They need to know if you have any of these conditions: °-cancer °-heart disease °-high blood pressure °-history of blood clots °-history of stroke °-low levels of folate, iron, or vitamin B12 in the blood °-seizures °-an unusual or allergic reaction to erythropoietin, albumin, benzyl alcohol, hamster proteins, other medicines, foods, dyes, or preservatives °-pregnant or trying to get pregnant °-breast-feeding °How should I use this medicine? °This medicine is for injection into a vein or under the skin. It is usually given by a health care professional in a hospital or clinic setting. °If you get this medicine at home, you will be taught how to prepare and give this medicine. Use exactly as directed. Take your medicine at regular intervals. Do not take your medicine more often than directed. °It is important that you put your used needles and syringes in a special sharps container. Do not put them in a trash can. If you do not have a sharps container, call your pharmacist or healthcare provider to get one. °A special MedGuide will be given to you by the pharmacist with each prescription and refill. Be sure to read this information carefully each time. °Talk to your pediatrician regarding the use of this medicine in children. While this drug may be prescribed for selected conditions, precautions do apply. °Overdosage: If you think you have taken too much of this medicine contact a poison control center  or emergency room at once. °NOTE: This medicine is only for you. Do not share this medicine with others. °What if I miss a dose? °If you miss a dose, take it as soon as you can. If it is almost time for your next dose, take only that dose. Do not take double or extra doses. °What may interact with this medicine? °Interactions have not been studied. °This list may not describe all possible interactions. Give your health care provider a list of all the medicines, herbs, non-prescription drugs, or dietary supplements you use. Also tell them if you smoke, drink alcohol, or use illegal drugs. Some items may interact with your medicine. °What should I watch for while using this medicine? °Your condition will be monitored carefully while you are receiving this medicine. °You may need blood work done while you are taking this medicine. °This medicine may cause a decrease in vitamin B6. You should make sure that you get enough vitamin B6 while you are taking this medicine. Discuss the foods you eat and the vitamins you take with your health care professional. °What side effects may I notice from receiving this medicine? °Side effects that you should report to your doctor or health care professional as soon as possible: °-allergic reactions like skin rash, itching or hives, swelling of the face, lips, or tongue °-seizures °-signs and symptoms of a blood clot such as breathing problems; changes in vision; chest pain; severe, sudden headache; pain, swelling, warmth in the leg; trouble speaking; sudden numbness or weakness of the face, arm or leg °-signs and symptoms of a stroke   like changes in vision; confusion; trouble speaking or understanding; severe headaches; sudden numbness or weakness of the face, arm or leg; trouble walking; dizziness; loss of balance or coordination °Side effects that usually do not require medical attention (report to your doctor or health care professional if they continue or are  bothersome): °-chills °-cough °-dizziness °-fever °-headaches °-joint pain °-muscle cramps °-muscle pain °-nausea, vomiting °-pain, redness, or irritation at site where injected °This list may not describe all possible side effects. Call your doctor for medical advice about side effects. You may report side effects to FDA at 1-800-FDA-1088. °Where should I keep my medicine? °Keep out of the reach of children. °Store in a refrigerator between 2 and 8 degrees C (36 and 46 degrees F). Do not freeze or shake. Throw away any unused portion if using a single-dose vial. Multi-dose vials can be kept in the refrigerator for up to 21 days after the initial dose. Throw away unused medicine. °NOTE: This sheet is a summary. It may not cover all possible information. If you have questions about this medicine, talk to your doctor, pharmacist, or health care provider. °© 2019 [employer] (2017-01-11 08:35:19) ° °

## 2018-12-12 DIAGNOSIS — N041 Nephrotic syndrome with focal and segmental glomerular lesions: Secondary | ICD-10-CM | POA: Diagnosis not present

## 2018-12-12 DIAGNOSIS — I10 Essential (primary) hypertension: Secondary | ICD-10-CM | POA: Diagnosis not present

## 2018-12-12 DIAGNOSIS — N183 Chronic kidney disease, stage 3 (moderate): Secondary | ICD-10-CM | POA: Diagnosis not present

## 2018-12-12 DIAGNOSIS — D631 Anemia in chronic kidney disease: Secondary | ICD-10-CM | POA: Diagnosis not present

## 2018-12-18 ENCOUNTER — Other Ambulatory Visit: Payer: Medicare Other

## 2018-12-18 ENCOUNTER — Ambulatory Visit: Payer: Medicare Other

## 2018-12-23 ENCOUNTER — Inpatient Hospital Stay: Payer: Medicare Other

## 2018-12-23 ENCOUNTER — Other Ambulatory Visit: Payer: Self-pay

## 2018-12-23 ENCOUNTER — Inpatient Hospital Stay: Payer: Medicare Other | Attending: Hematology and Oncology

## 2018-12-23 VITALS — BP 150/64 | Temp 96.9°F | Resp 18

## 2018-12-23 DIAGNOSIS — N184 Chronic kidney disease, stage 4 (severe): Secondary | ICD-10-CM | POA: Insufficient documentation

## 2018-12-23 DIAGNOSIS — I129 Hypertensive chronic kidney disease with stage 1 through stage 4 chronic kidney disease, or unspecified chronic kidney disease: Secondary | ICD-10-CM

## 2018-12-23 DIAGNOSIS — D649 Anemia, unspecified: Secondary | ICD-10-CM

## 2018-12-23 DIAGNOSIS — D631 Anemia in chronic kidney disease: Secondary | ICD-10-CM | POA: Insufficient documentation

## 2018-12-23 LAB — CBC WITH DIFFERENTIAL/PLATELET
Abs Immature Granulocytes: 0.08 10*3/uL — ABNORMAL HIGH (ref 0.00–0.07)
Basophils Absolute: 0 10*3/uL (ref 0.0–0.1)
Basophils Relative: 0 %
Eosinophils Absolute: 0 10*3/uL (ref 0.0–0.5)
Eosinophils Relative: 0 %
HCT: 34.7 % — ABNORMAL LOW (ref 39.0–52.0)
Hemoglobin: 10.3 g/dL — ABNORMAL LOW (ref 13.0–17.0)
Immature Granulocytes: 1 %
Lymphocytes Relative: 16 %
Lymphs Abs: 2 10*3/uL (ref 0.7–4.0)
MCH: 27.8 pg (ref 26.0–34.0)
MCHC: 29.7 g/dL — ABNORMAL LOW (ref 30.0–36.0)
MCV: 93.5 fL (ref 80.0–100.0)
Monocytes Absolute: 0.4 10*3/uL (ref 0.1–1.0)
Monocytes Relative: 3 %
Neutro Abs: 9.7 10*3/uL — ABNORMAL HIGH (ref 1.7–7.7)
Neutrophils Relative %: 80 %
Platelets: 327 10*3/uL (ref 150–400)
RBC: 3.71 MIL/uL — ABNORMAL LOW (ref 4.22–5.81)
RDW: 16.3 % — ABNORMAL HIGH (ref 11.5–15.5)
WBC: 12.2 10*3/uL — ABNORMAL HIGH (ref 4.0–10.5)
nRBC: 0 % (ref 0.0–0.2)

## 2018-12-23 MED ORDER — EPOETIN ALFA-EPBX 40000 UNIT/ML IJ SOLN
40000.0000 [IU] | Freq: Once | INTRAMUSCULAR | Status: AC
  Administered 2018-12-23: 14:00:00 40000 [IU] via SUBCUTANEOUS

## 2018-12-23 NOTE — Patient Instructions (Signed)

## 2019-01-15 ENCOUNTER — Ambulatory Visit: Payer: Medicare Other | Admitting: Hematology and Oncology

## 2019-01-15 ENCOUNTER — Other Ambulatory Visit: Payer: Medicare Other

## 2019-01-15 ENCOUNTER — Ambulatory Visit: Payer: Medicare Other

## 2019-01-15 NOTE — Progress Notes (Signed)
[facility]  [address], [address] [place], [place] [address] Phone: [phone]  Fax: [phone]   Clinic Day:  01/15/2019  Referring physician: Glean Hess, MD  Chief Complaint: EMMERT Bowen is a 78 y.o. male with anemia of chronic kidney disease and B12 deficiency who is seen for 2 month assessment.  HPI: The patient was last seen in the hematology clinic on 11/06/2018. At that time, he was doing well. He was s/p re-excision of a basal cell carcinoma from his back in 09/2018. Hemoglobin9.6, hematocrit 31.5, MCV95.5, and platelets 235,000. Ferritin 219. BUN47and creatinine 2.56.  He received Retacrit on 11/25/2018 and 12/23/2018.   Labs followed: 11/25/2018: Hematocrit 28.9, hemoglobin 8.7, MCV 93.5, platelets 342,000, WBC 10,800.  12/23/2018: Hematocrit 34.7, hemoglobin 10.3, MCV 93.5, platelets 327,000, WBC 12,200.   During the interim, the patient has done well. He notes right leg pain after sitting for a while. He reports feeling a sharp pain like "pins and needles". He notes urinary frequency at night. He reports easy bruising/bleeding while on aspirin.  Right scapular pain has resolved.   His weight is up 6 pounds. His blood pressure is 161/66 in the clinic today. He reports his BP has been "running normal".   Past Medical History:  Diagnosis Date  . Acquired phimosis   . BPH (benign prostatic hypertrophy)   . Chronic kidney disease   . Dyspnea    with exertion  . Elevated PSA   . Gross hematuria   . Hematuria   . History of hiatal hernia   . Hypertension   . 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 Renal mass, left     Past Surgical History:  Procedure Laterality Date  . Circumscision  09/2015  . CORONARY ARTERY BYPASS GRAFT  2002   x3 @ [facility]  . INGUINAL HERNIA REPAIR Left 2011  . RENAL BIOPSY  2014   neg in w/u renal mass/hematuria    Family History  Problem Relation Age of Onset  . Lymphoma Mother   .  Hodgkin's lymphoma Mother   . Lung cancer Father   . Heart disease Brother   . Dementia Sister   . Dementia Brother   . Breast cancer Maternal Aunt     Social History:  reports that he quit smoking about 15 months ago. His smoking use included cigarettes. He has a 25.00 pack-year smoking history. He has quit using smokeless tobacco.  His smokeless tobacco use included chew. He reports that he does not drink alcohol or use drugs.The patient is alone today.  Allergies:  Allergies  Allergen Reactions  . Penicillins Other (See Comments)  . Sulfa Antibiotics Other (See Comments)    Current Medications: Current Outpatient Medications  Medication Sig Dispense Refill  . amLODipine (NORVASC) 5 MG tablet Take 1 tablet (5 mg total) by mouth daily. 90 tablet 1  . aspirin 81 MG tablet Take 1 tablet by mouth daily. Reported on 09/02/2015    . atorvastatin (LIPITOR) 40 MG tablet Take 1 tablet by mouth once daily 90 tablet 1  . calcitRIOL (ROCALTROL) 0.25 MCG capsule Take 0.25 mcg by mouth daily.    . Cyanocobalamin (B-12) 1000 MCG/ML KIT Inject 1,000 mcg as directed every 30 (thirty) days. 1 kit 6  . Cyanocobalamin (VITAMIN B 12 PO) Take 1,000 mcg by mouth daily.     . finasteride (PROSCAR) 5 MG tablet Take 1 tablet by mouth once daily 90 tablet 3  . fluticasone (  FLONASE) 50 MCG/ACT nasal spray Place 2 sprays into both nostrils daily. (Patient taking differently: Place 2 sprays into both nostrils as needed. ) 16 g 5  . hydrALAZINE (APRESOLINE) 50 MG tablet Take 75 mg by mouth 3 (three) times daily.    Marland Kitchen lisinopril (ZESTRIL) 20 MG tablet Take 1 tablet by mouth once daily 90 tablet 1  . nitroGLYCERIN (NITROSTAT) 0.4 MG SL tablet Place 1 tablet (0.4 mg total) under the tongue every 5 (five) minutes as needed for chest pain. 50 tablet 3  . predniSONE (DELTASONE) 10 MG tablet Take 10 mg by mouth daily with breakfast.     . triamcinolone cream (KENALOG) 0.1 % as needed.      No current  facility-administered medications for this visit.     Review of Systems  Constitutional: Negative.  Negative for chills, diaphoresis, fever, malaise/fatigue and weight loss (up 6 pounds).       Doing well.  HENT: Negative for congestion, ear pain, hearing loss, nosebleeds and sinus pain.   Eyes: Negative.  Negative for blurred vision, double vision and photophobia.  Respiratory: Negative.  Negative for cough, hemoptysis, sputum production and shortness of breath.   Cardiovascular: Negative.  Negative for chest pain, palpitations, orthopnea, leg swelling and PND.  Gastrointestinal: Negative.  Negative for abdominal pain, blood in stool, constipation, diarrhea, melena, nausea and vomiting.  Genitourinary: Positive for frequency (at night). Negative for dysuria, hematuria and urgency.       Nocturia, 2-3x nightly  Musculoskeletal: Positive for myalgias (right leg). Negative for back pain, falls and joint pain.  Skin: Negative for rash.       Back, healed.  Neurological: Negative.  Negative for dizziness, tremors, speech change, focal weakness, weakness and headaches.  Endo/Heme/Allergies: Bruises/bleeds easily (on daily ASA).  Psychiatric/Behavioral: Negative.  Negative for depression and memory loss. The patient is not nervous/anxious and does not have insomnia.   All other systems reviewed and are negative.  Performance status (ECOG): 1  Vitals Blood pressure (!) 161/66, pulse 83, temperature (!) 97.5 F (36.4 C), temperature source Tympanic, resp. rate 18, height 6' (1.829 m), weight 197 lb 6.8 oz (89.5 kg), SpO2 100 %.  Physical Exam  Constitutional: He is oriented to person, place, and time. He appears well-developed and well-nourished. No distress.  HENT:  Head: Normocephalic and atraumatic.  Mouth/Throat: Oropharynx is clear and moist. No oropharyngeal exudate.  White hair. Male pattern baldness.  Mask.  Eyes: Pupils are equal, round, and reactive to light. Conjunctivae and EOM  are normal. No scleral icterus.  Glasses.  Neck: Normal range of motion. Neck supple. No JVD present.  Cardiovascular: Normal rate, regular rhythm and normal heart sounds.  No murmur heard. Pulmonary/Chest: Effort normal and breath sounds normal. No respiratory distress. He has no wheezes. He has no rales.  Abdominal: Soft. Bowel sounds are normal. He exhibits no distension and no mass. There is no abdominal tenderness. There is no rebound and no guarding.  Musculoskeletal: Normal range of motion.        General: No edema.  Lymphadenopathy:    He has no cervical adenopathy.    He has no axillary adenopathy.       Right: No supraclavicular adenopathy present.       Left: No supraclavicular adenopathy present.  Neurological: He is alert and oriented to person, place, and time.  Skin: Skin is warm and dry. Bruising (scattered) noted. No rash noted. He is not diaphoretic. No erythema. No pallor.  Right  sided scapular wound has healed.  Psychiatric: He has a normal mood and affect. His behavior is normal. Judgment and thought content normal.  Nursing note and vitals reviewed.     Back   No visits with results within 3 Day(s) from this visit.  Latest known visit with results is:  Appointment on 12/23/2018  Component Date Value Ref Range Status  . WBC 12/23/2018 12.2* 4.0 - 10.5 K/uL Final  . RBC 12/23/2018 3.71* 4.22 - 5.81 MIL/uL Final  . Hemoglobin 12/23/2018 10.3* 13.0 - 17.0 g/dL Final  . HCT 12/23/2018 34.7* 39.0 - 52.0 % Final  . MCV 12/23/2018 93.5  80.0 - 100.0 fL Final  . MCH 12/23/2018 27.8  26.0 - 34.0 pg Final  . MCHC 12/23/2018 29.7* 30.0 - 36.0 g/dL Final  . RDW 12/23/2018 16.3* 11.5 - 15.5 % Final  . Platelets 12/23/2018 327  150 - 400 K/uL Final  . nRBC 12/23/2018 0.0  0.0 - 0.2 % Final  . Neutrophils Relative % 12/23/2018 80  % Final  . Neutro Abs 12/23/2018 9.7* 1.7 - 7.7 K/uL Final  . Lymphocytes Relative 12/23/2018 16  % Final  . Lymphs Abs 12/23/2018 2.0  0.7 -  4.0 K/uL Final  . Monocytes Relative 12/23/2018 3  % Final  . Monocytes Absolute 12/23/2018 0.4  0.1 - 1.0 K/uL Final  . Eosinophils Relative 12/23/2018 0  % Final  . Eosinophils Absolute 12/23/2018 0.0  0.0 - 0.5 K/uL Final  . Basophils Relative 12/23/2018 0  % Final  . Basophils Absolute 12/23/2018 0.0  0.0 - 0.1 K/uL Final  . Immature Granulocytes 12/23/2018 1  % Final  . Abs Immature Granulocytes 12/23/2018 0.08* 0.00 - 0.07 K/uL Final   Performed at [facility], [address] [place], [place] [address]    Assessment:  Arthur Bowen is a 78 y.o. male withanemia of chronic renal disease and B12 deficiency. He has focal segmental glomerulosclerosis (FSGS) and due to worsening kidney function, Cellcept was stopped.   He received Procrit 50,000 units monthly through 02/20/2018. Started Retacrit on 08/08/2018 (50,000 units). In March 2020, his counts continued to be low and frequency was increased to every 14 days by Honor Loh, NP. He last received retacrit 40,000 units on 09/22/2018.   Hemoglobin has been followed. 9.0- (08/14/2018), 7.8- (09/05/2018), 8.3- (09/19/2018). Today, hemoglobin is 8.2 which is slightly worse but overall stable.He has history of transfusions.Given hemoglobin today is >7 and he is asymptomatic, no need for transfusion. MCV 100.7 (slightly elevated).   He has B12 deficiencyand continues monthly home b12 injections. Last B12 level was on 08/07/2018- 629 (normal). Intrinsic factor antibody and antiparietal antibody was negative on 08/06/2018. Appears to be tolerating injections well without significant side effect or complication.  He had a large (3.6) lesion removed from his right scapular back area on 08/22/2018. 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 Margins were not clear at that time and was reexcised by dermatology on 10/02/2014. No suspicion of secondary infection today.  -Continue follow-up with dermatology for continued  management.  He has a left renal mass status post CT-guided biopsy on 09/15/2012. Allergy was negative. Ultrasound of kidneys on 11/18/2017 revealed a stable 5.1 cm left kidney mass. Followed by Dr. Erlene Quan asymptomatic today. -Continue to follow-up with Dr. Erlene Quan for continued management.  [facility] symptomatic bradycardiathought to be due to beta-blocker and symptomatic anemia. Hemoglobin was 6.4 and he received 2 units of PRBCs. Work-up revealed ferritin  247, iron saturation 33%, folate normal. B12 was 311 (was previously 6177 on 10/24/17)and he began B12injections.  He was [facility] admitted to [facility] on 08/06/2018-08/07/2018 for anemia. Hemoglobin was 5.8. Ferritin was 350. B12was 629. Folate was 7.5. Received 3 units of PRBCs.  Symptomatically, he feels good.  Exam is unremarkable.  Hemoglobin is 9.9.  Plan: 1.   Labs today: CBC with diff, BMP, ferritin. 2.   Anemia of chronic kidney disease             Hematocrit 33.4.  Hemoglobin 9.9.  MCV 93.8.  Hemoglobin goal is >= 10. He is doing well on the current Retacrit regimen. Retacrit today. 3.   Stage III chronic kidney disease  BUN46and creatinine 2.30. Patient is followed by Dr. Holley Raring.    Continue to monitor. 4.   B12 deficiency Patient continues B12 monthly. Check folate annually. 5.   Basal cell carcinoma Patient status post reexcision of a basal cell carcinoma overlying the right scapula.   Lesion has healed.  6.   RTC every 4 weeks x 3 for HCT/Hgb and +/- Retacrit 7.   RTC in 3 months for MD assessment, labs (CBC with diff, BMP, ferritin), and +/- Retacrit.  I discussed the assessment and treatment plan with the patient.  The patient was provided an opportunity to ask questions and all were answered.  The patient agreed with the plan and demonstrated an understanding of the instructions.  The patient was advised to call back if the symptoms worsen or if the condition fails to  improve as anticipated.   Lequita Asal, MD, PhD    01/15/2019, 8:52 AM  I, Selena Batten, am acting as scribe for [employer]. Mike Gip, MD, PhD.  I, Melissa C. Mike Gip, MD, have reviewed the above documentation for accuracy and completeness, and I agree with the above.

## 2019-01-19 ENCOUNTER — Other Ambulatory Visit: Payer: Self-pay

## 2019-01-20 ENCOUNTER — Encounter: Payer: Self-pay | Admitting: Hematology and Oncology

## 2019-01-20 ENCOUNTER — Inpatient Hospital Stay (HOSPITAL_BASED_OUTPATIENT_CLINIC_OR_DEPARTMENT_OTHER): Payer: Medicare Other | Admitting: Hematology and Oncology

## 2019-01-20 ENCOUNTER — Inpatient Hospital Stay: Payer: Medicare Other | Attending: Hematology and Oncology

## 2019-01-20 ENCOUNTER — Inpatient Hospital Stay: Payer: Medicare Other

## 2019-01-20 VITALS — BP 140/82 | HR 83 | Temp 97.5°F | Resp 18 | Ht 72.0 in | Wt 197.4 lb

## 2019-01-20 DIAGNOSIS — E538 Deficiency of other specified B group vitamins: Secondary | ICD-10-CM

## 2019-01-20 DIAGNOSIS — N184 Chronic kidney disease, stage 4 (severe): Secondary | ICD-10-CM

## 2019-01-20 DIAGNOSIS — I129 Hypertensive chronic kidney disease with stage 1 through stage 4 chronic kidney disease, or unspecified chronic kidney disease: Secondary | ICD-10-CM | POA: Insufficient documentation

## 2019-01-20 DIAGNOSIS — D631 Anemia in chronic kidney disease: Secondary | ICD-10-CM

## 2019-01-20 DIAGNOSIS — N183 Chronic kidney disease, stage 3 (moderate): Secondary | ICD-10-CM | POA: Insufficient documentation

## 2019-01-20 DIAGNOSIS — D649 Anemia, unspecified: Secondary | ICD-10-CM

## 2019-01-20 LAB — CBC WITH DIFFERENTIAL/PLATELET
Abs Immature Granulocytes: 0.06 10*3/uL (ref 0.00–0.07)
Basophils Absolute: 0 10*3/uL (ref 0.0–0.1)
Basophils Relative: 0 %
Eosinophils Absolute: 0.2 10*3/uL (ref 0.0–0.5)
Eosinophils Relative: 2 %
HCT: 33.4 % — ABNORMAL LOW (ref 39.0–52.0)
Hemoglobin: 9.9 g/dL — ABNORMAL LOW (ref 13.0–17.0)
Immature Granulocytes: 1 %
Lymphocytes Relative: 18 %
Lymphs Abs: 2 10*3/uL (ref 0.7–4.0)
MCH: 27.8 pg (ref 26.0–34.0)
MCHC: 29.6 g/dL — ABNORMAL LOW (ref 30.0–36.0)
MCV: 93.8 fL (ref 80.0–100.0)
Monocytes Absolute: 0.8 10*3/uL (ref 0.1–1.0)
Monocytes Relative: 7 %
Neutro Abs: 7.8 10*3/uL — ABNORMAL HIGH (ref 1.7–7.7)
Neutrophils Relative %: 72 %
Platelets: 299 10*3/uL (ref 150–400)
RBC: 3.56 MIL/uL — ABNORMAL LOW (ref 4.22–5.81)
RDW: 17.7 % — ABNORMAL HIGH (ref 11.5–15.5)
WBC: 10.8 10*3/uL — ABNORMAL HIGH (ref 4.0–10.5)
nRBC: 0 % (ref 0.0–0.2)

## 2019-01-20 LAB — BASIC METABOLIC PANEL
Anion gap: 7 (ref 5–15)
BUN: 46 mg/dL — ABNORMAL HIGH (ref 8–23)
CO2: 24 mmol/L (ref 22–32)
Calcium: 8.7 mg/dL — ABNORMAL LOW (ref 8.9–10.3)
Chloride: 114 mmol/L — ABNORMAL HIGH (ref 98–111)
Creatinine, Ser: 2.3 mg/dL — ABNORMAL HIGH (ref 0.61–1.24)
GFR calc Af Amer: 30 mL/min — ABNORMAL LOW (ref >60)
GFR calc non Af Amer: 26 mL/min — ABNORMAL LOW (ref >60)
Glucose, Bld: 119 mg/dL — ABNORMAL HIGH (ref 70–99)
Potassium: 5.1 mmol/L (ref 3.5–5.1)
Sodium: 145 mmol/L (ref 135–145)

## 2019-01-20 LAB — SAMPLE TO BLOOD BANK

## 2019-01-20 LAB — FERRITIN: Ferritin: 99 ng/mL (ref 24–336)

## 2019-01-20 MED ORDER — EPOETIN ALFA-EPBX 40000 UNIT/ML IJ SOLN
40000.0000 [IU] | Freq: Once | INTRAMUSCULAR | Status: AC
  Administered 2019-01-20: 40000 [IU] via SUBCUTANEOUS

## 2019-01-20 NOTE — Progress Notes (Signed)
Patient c/o stinging to left upper thigh/ occ

## 2019-01-20 NOTE — Patient Instructions (Signed)

## 2019-02-13 ENCOUNTER — Other Ambulatory Visit: Payer: Self-pay

## 2019-02-13 DIAGNOSIS — D649 Anemia, unspecified: Secondary | ICD-10-CM

## 2019-02-13 DIAGNOSIS — N041 Nephrotic syndrome with focal and segmental glomerular lesions: Secondary | ICD-10-CM | POA: Diagnosis not present

## 2019-02-13 DIAGNOSIS — R809 Proteinuria, unspecified: Secondary | ICD-10-CM | POA: Diagnosis not present

## 2019-02-13 DIAGNOSIS — D631 Anemia in chronic kidney disease: Secondary | ICD-10-CM | POA: Diagnosis not present

## 2019-02-13 DIAGNOSIS — N183 Chronic kidney disease, stage 3 (moderate): Secondary | ICD-10-CM | POA: Diagnosis not present

## 2019-02-13 DIAGNOSIS — I129 Hypertensive chronic kidney disease with stage 1 through stage 4 chronic kidney disease, or unspecified chronic kidney disease: Secondary | ICD-10-CM

## 2019-02-13 DIAGNOSIS — N184 Chronic kidney disease, stage 4 (severe): Secondary | ICD-10-CM

## 2019-02-13 DIAGNOSIS — I10 Essential (primary) hypertension: Secondary | ICD-10-CM | POA: Diagnosis not present

## 2019-02-16 ENCOUNTER — Other Ambulatory Visit: Payer: Self-pay

## 2019-02-16 DIAGNOSIS — R809 Proteinuria, unspecified: Secondary | ICD-10-CM | POA: Diagnosis not present

## 2019-02-16 DIAGNOSIS — I10 Essential (primary) hypertension: Secondary | ICD-10-CM | POA: Diagnosis not present

## 2019-02-16 DIAGNOSIS — N183 Chronic kidney disease, stage 3 (moderate): Secondary | ICD-10-CM | POA: Diagnosis not present

## 2019-02-16 DIAGNOSIS — N041 Nephrotic syndrome with focal and segmental glomerular lesions: Secondary | ICD-10-CM | POA: Diagnosis not present

## 2019-02-16 DIAGNOSIS — D631 Anemia in chronic kidney disease: Secondary | ICD-10-CM | POA: Diagnosis not present

## 2019-02-17 ENCOUNTER — Other Ambulatory Visit: Payer: Self-pay

## 2019-02-17 ENCOUNTER — Inpatient Hospital Stay: Payer: Medicare Other | Attending: Hematology and Oncology

## 2019-02-17 ENCOUNTER — Inpatient Hospital Stay: Payer: Medicare Other

## 2019-02-17 VITALS — BP 150/70 | HR 69 | Temp 97.0°F | Resp 20

## 2019-02-17 DIAGNOSIS — N184 Chronic kidney disease, stage 4 (severe): Secondary | ICD-10-CM | POA: Diagnosis not present

## 2019-02-17 DIAGNOSIS — I129 Hypertensive chronic kidney disease with stage 1 through stage 4 chronic kidney disease, or unspecified chronic kidney disease: Secondary | ICD-10-CM

## 2019-02-17 DIAGNOSIS — D631 Anemia in chronic kidney disease: Secondary | ICD-10-CM | POA: Insufficient documentation

## 2019-02-17 DIAGNOSIS — D649 Anemia, unspecified: Secondary | ICD-10-CM

## 2019-02-17 LAB — HEMOGLOBIN AND HEMATOCRIT, BLOOD
HCT: 33 % — ABNORMAL LOW (ref 39.0–52.0)
Hemoglobin: 10.1 g/dL — ABNORMAL LOW (ref 13.0–17.0)

## 2019-02-17 MED ORDER — EPOETIN ALFA-EPBX 40000 UNIT/ML IJ SOLN
40000.0000 [IU] | Freq: Once | INTRAMUSCULAR | Status: AC
  Administered 2019-02-17: 11:00:00 40000 [IU] via SUBCUTANEOUS

## 2019-03-09 DIAGNOSIS — N189 Chronic kidney disease, unspecified: Secondary | ICD-10-CM | POA: Diagnosis not present

## 2019-03-10 ENCOUNTER — Other Ambulatory Visit: Payer: Self-pay

## 2019-03-10 DIAGNOSIS — I129 Hypertensive chronic kidney disease with stage 1 through stage 4 chronic kidney disease, or unspecified chronic kidney disease: Secondary | ICD-10-CM

## 2019-03-10 DIAGNOSIS — N184 Chronic kidney disease, stage 4 (severe): Secondary | ICD-10-CM

## 2019-03-10 DIAGNOSIS — D631 Anemia in chronic kidney disease: Secondary | ICD-10-CM

## 2019-03-17 ENCOUNTER — Inpatient Hospital Stay: Payer: Medicare Other

## 2019-03-17 ENCOUNTER — Other Ambulatory Visit: Payer: Self-pay

## 2019-03-17 DIAGNOSIS — D631 Anemia in chronic kidney disease: Secondary | ICD-10-CM

## 2019-03-17 DIAGNOSIS — I129 Hypertensive chronic kidney disease with stage 1 through stage 4 chronic kidney disease, or unspecified chronic kidney disease: Secondary | ICD-10-CM

## 2019-03-17 DIAGNOSIS — N184 Chronic kidney disease, stage 4 (severe): Secondary | ICD-10-CM

## 2019-03-17 DIAGNOSIS — D649 Anemia, unspecified: Secondary | ICD-10-CM

## 2019-03-17 LAB — HEMOGLOBIN AND HEMATOCRIT, BLOOD
HCT: 30.5 % — ABNORMAL LOW (ref 39.0–52.0)
Hemoglobin: 9.1 g/dL — ABNORMAL LOW (ref 13.0–17.0)

## 2019-03-17 NOTE — Progress Notes (Signed)
Patient's blood pressure 196/79, pulse 78.  Patient said that he took his blood pressure medication at 5 am this morning.  He has opted to return tomorrow morning at 8:30am to recheck his blood pressure and to get his retacrit injection.  Patient advised to see medical care if he incurs any symptoms of pain in the chest or shortness of breath.  Patient verbalized understanding.

## 2019-03-18 ENCOUNTER — Inpatient Hospital Stay: Payer: Medicare Other

## 2019-03-18 VITALS — BP 160/82 | Temp 96.8°F | Resp 18

## 2019-03-18 DIAGNOSIS — I129 Hypertensive chronic kidney disease with stage 1 through stage 4 chronic kidney disease, or unspecified chronic kidney disease: Secondary | ICD-10-CM

## 2019-03-18 DIAGNOSIS — D649 Anemia, unspecified: Secondary | ICD-10-CM

## 2019-03-18 DIAGNOSIS — N184 Chronic kidney disease, stage 4 (severe): Secondary | ICD-10-CM | POA: Diagnosis not present

## 2019-03-18 DIAGNOSIS — D631 Anemia in chronic kidney disease: Secondary | ICD-10-CM | POA: Diagnosis not present

## 2019-03-18 MED ORDER — EPOETIN ALFA-EPBX 40000 UNIT/ML IJ SOLN
40000.0000 [IU] | Freq: Once | INTRAMUSCULAR | Status: AC
  Administered 2019-03-18: 40000 [IU] via SUBCUTANEOUS

## 2019-03-18 NOTE — Patient Instructions (Signed)

## 2019-03-18 NOTE — Progress Notes (Signed)
Blood pressure 160/82 this morning.  Patient advised to make contact with his primary care or cardiologist in regards to his blood pressure being elevated after medication.  Patient also shared that his blood pressure may be high because he is by himself while his [place] is at the beach for two weeks.  Comfort words provided.

## 2019-03-20 ENCOUNTER — Ambulatory Visit (INDEPENDENT_AMBULATORY_CARE_PROVIDER_SITE_OTHER): Payer: Medicare Other

## 2019-03-20 ENCOUNTER — Other Ambulatory Visit: Payer: Self-pay

## 2019-03-20 DIAGNOSIS — Z23 Encounter for immunization: Secondary | ICD-10-CM | POA: Diagnosis not present

## 2019-03-23 ENCOUNTER — Other Ambulatory Visit: Payer: Self-pay

## 2019-03-23 DIAGNOSIS — I10 Essential (primary) hypertension: Secondary | ICD-10-CM

## 2019-03-23 MED ORDER — AMLODIPINE BESYLATE 5 MG PO TABS: 5.0000 mg | ORAL_TABLET | Freq: Every day | ORAL | 1 refills | Status: DC

## 2019-04-12 NOTE — Progress Notes (Signed)
[facility]  [address], [address] [place], [place] [address] Phone: [phone]  Fax: [phone]   Clinic Day:  04/14/2019  Referring physician: Glean Hess, MD  Chief Complaint: Arthur Bowen is a 78 y.o. male with anemia of chronic kidney disease and B12 deficiency who is seen for 2 month assessment.   HPI: The patient was last seen in the hematology clinic on 01/20/2019.  At that time, he felt good. Exam was unremarkable. Hematocrit 33.4. Hemoglobin was 9.9. BUN 46. Creatinine 2.30. He received Retacrit. He continued B12 monthly.   Patient's blood pressure was 196/79 in the infusion center on 03/17/2019. He reported taking his blood pressure medication that morning. Patient was advised to seek medical care if he had chest pain or shortness of breath.  Patient returned to the infusion center on 03/18/2019 and his blood pressure was 160/82. Patient was advised to contact his PCP or cardiologist. He noted his blood pressure was high because he was by himself while his [place] was at the beach.   Patient received Retacrit on 02/17/2019 and 03/18/2019.  Labs followed: 02/17/2019: Hematocrit 33.0. Hemoglobin 10.1. 03/17/2019: Hematocrit 30.5. Hemoglobin 9.1.  During the interim, he has felt good. He has lower back pain. His weight is up 9 pounds. His baseline weight is 190 pounds. The scapular wound on his back has healed nicely. His right leg pain has resolved. He notes a little bruising while on aspirin. He notes frequency x 2 at night. His nephrologist noted that his kidneys were stable. His manual BP was 192/82 in the clinic today.    Past Medical History:  Diagnosis Date   Acquired phimosis    BPH (benign prostatic hypertrophy)    Chronic kidney disease    Dyspnea    with exertion   Elevated PSA    Gross hematuria    Hematuria    History of hiatal hernia    Hypertension    Myocardial infarction [facility])    CABG-triple   Renal mass,  left     Past Surgical History:  Procedure Laterality Date   Circumscision  09/2015   CORONARY ARTERY BYPASS GRAFT  2002   x3 @ [place] Left 2011   RENAL BIOPSY  2014   neg in w/u renal mass/hematuria    Family History  Problem Relation Age of Onset   Lymphoma Mother    Hodgkin's lymphoma Mother    Lung cancer Father    Heart disease Brother    Dementia Sister    Dementia Brother    Breast cancer Maternal Aunt     Social History:  reports that he quit smoking about 18 months ago. His smoking use included cigarettes. He has a 25.00 pack-year smoking history. He has quit using smokeless tobacco.  His smokeless tobacco use included chew. He reports that he does not drink alcohol or use drugs. His granddaughter is a EMT and she is going to school to be a Marine scientist. He notes she will finish nursing school in x 2 years. He lives in [place].  The patient is alone today.  Allergies:  Allergies  Allergen Reactions   Penicillins Other (See Comments)   Sulfa Antibiotics Other (See Comments)    Current Medications: Current Outpatient Medications  Medication Sig Dispense Refill   amLODipine (NORVASC) 5 MG tablet Take 1 tablet (5 mg total) by mouth daily. 90 tablet 1   aspirin 81 MG tablet Take 1 tablet by  daily. Reported on 09/02/2015    °• atorvastatin (LIPITOR) 40 MG tablet Take 1 tablet by mouth once daily 90 tablet 1  °• calcitRIOL (ROCALTROL) 0.25 MCG capsule Take 0.25 mcg by mouth daily.    °• Cyanocobalamin (B-12) 1000 MCG/ML KIT Inject 1,000 mcg as directed every 30 (thirty) days. 1 kit 6  °• finasteride (PROSCAR) 5 MG tablet Take 1 tablet by mouth once daily 90 tablet 3  °• fluticasone (FLONASE) 50 MCG/ACT nasal spray Place 2 sprays into both nostrils daily. (Patient taking differently: Place 2 sprays into both nostrils as needed. ) 16 g 5  °• hydrALAZINE (APRESOLINE) 25 MG tablet Take 25 mg by mouth 3 (three) times daily.    °• hydrALAZINE  (APRESOLINE) 50 MG tablet Take 75 mg by mouth 3 (three) times daily.    °• lisinopril (ZESTRIL) 20 MG tablet Take 1 tablet by mouth once daily 90 tablet 1  °• predniSONE (DELTASONE) 10 MG tablet Take 10 mg by mouth daily with breakfast.     °• triamcinolone cream (KENALOG) 0.1 % Apply 1 application topically as needed.     °• nitroGLYCERIN (NITROSTAT) 0.4 MG SL tablet Place 1 tablet (0.4 mg total) under the tongue every 5 (five) minutes as needed for chest pain. (Patient not taking: Reported on 01/20/2019) 50 tablet 3  ° °No current facility-administered medications for this visit.   ° ° °Review of Systems  °Constitutional: Negative.  Negative for chills, diaphoresis, fever, malaise/fatigue and weight loss (up 9 pounds, baseline 190 pounds).  °     Feels "good".  °HENT: Negative.  Negative for congestion, ear pain, hearing loss, nosebleeds, sinus pain and sore throat.   °Eyes: Negative.  Negative for blurred vision, double vision and photophobia.  °Respiratory: Negative.  Negative for cough, hemoptysis, sputum production and shortness of breath.   °Cardiovascular: Negative.  Negative for chest pain, palpitations, orthopnea, leg swelling and PND.  °Gastrointestinal: Negative.  Negative for abdominal pain, blood in stool, constipation, diarrhea, melena, nausea and vomiting.  °Genitourinary: Negative for dysuria, frequency, hematuria and urgency.  °     Nocturia, x 2 nightly  °Musculoskeletal: Positive for back pain (lower back). Negative for falls, joint pain and myalgias.  °Skin: Negative for rash.  °     Back, healed.  °Neurological: Negative.  Negative for dizziness, tremors, speech change, focal weakness, weakness and headaches.  °Endo/Heme/Allergies: Bruises/bleeds easily (bruising on daily ASA).  °Psychiatric/Behavioral: Negative.  Negative for depression and memory loss. The patient is not nervous/anxious and does not have insomnia.   °All other systems reviewed and are negative. ° °Performance status (ECOG):  1 ° °Vitals °Blood pressure (!) 192/82, pulse 77, temperature 98.3 °F (36.8 °C), temperature source Oral, resp. rate 16, weight 206 lb 14.4 oz (93.9 kg), SpO2 100 %. ° °Physical Exam  °Constitutional: He is oriented to person, place, and time. He appears well-developed and well-nourished. No distress.  °HENT:  °Head: Normocephalic and atraumatic.  °Mouth/Throat: Oropharynx is clear and moist. No oropharyngeal exudate.  °Short gray hair. Male pattern baldness.  Mask.  °Eyes: Pupils are equal, round, and reactive to light. Conjunctivae and EOM are normal. No scleral icterus.  °Glasses.  °Neck: Normal range of motion. Neck supple. No JVD present.  °Cardiovascular: Normal rate, regular rhythm and normal heart sounds.  °No murmur heard. °Pulmonary/Chest: Effort normal and breath sounds normal. No respiratory distress. He has no wheezes. He has no rales.  °Abdominal: Soft. Bowel sounds are normal. He exhibits no distension   and no mass. There is no abdominal tenderness. There is no rebound and no guarding.  °Musculoskeletal: Normal range of motion.     °   General: No edema.  °Lymphadenopathy:  °  He has no cervical adenopathy.  °  He has no axillary adenopathy.  °     Right: No supraclavicular adenopathy present.  °     Left: No supraclavicular adenopathy present.  °Neurological: He is alert and oriented to person, place, and time.  °Skin: Skin is warm and dry. Bruising (scattered) noted. No rash noted. He is not diaphoretic. No erythema. No pallor.  °Right sided scapular wound healed.  Left arm bruise.  °Psychiatric: He has a normal mood and affect. His behavior is normal. Judgment and thought content normal.  °Nursing note and vitals reviewed. ° ° °Appointment on 04/14/2019  °Component Date Value Ref Range Status  °• Sodium 04/14/2019 141  135 - 145 mmol/L Final  °• Potassium 04/14/2019 5.0  3.5 - 5.1 mmol/L Final  °• Chloride 04/14/2019 113* 98 - 111 mmol/L Final  °• CO2 04/14/2019 22  22 - 32 mmol/L Final  °• Glucose,  Bld 04/14/2019 96  70 - 99 mg/dL Final  °• BUN 04/14/2019 55* 8 - 23 mg/dL Final  °• Creatinine, Ser 04/14/2019 3.16* 0.61 - 1.24 mg/dL Final  °• Calcium 04/14/2019 8.9  8.9 - 10.3 mg/dL Final  °• GFR calc non Af Amer 04/14/2019 18* >60 mL/min Final  °• GFR calc Af Amer 04/14/2019 20* >60 mL/min Final  °• Anion gap 04/14/2019 6  5 - 15 Final  ° Performed at [facility], [address] [place], [place] [address]  °• WBC 04/14/2019 14.5* 4.0 - 10.5 K/uL Final  °• RBC 04/14/2019 3.47* 4.22 - 5.81 MIL/uL Final  °• Hemoglobin 04/14/2019 9.9* 13.0 - 17.0 g/dL Final  °• HCT 04/14/2019 32.3* 39.0 - 52.0 % Final  °• MCV 04/14/2019 93.1  80.0 - 100.0 fL Final  °• MCH 04/14/2019 28.5  26.0 - 34.0 pg Final  °• MCHC 04/14/2019 30.7  30.0 - 36.0 g/dL Final  °• RDW 04/14/2019 17.2* 11.5 - 15.5 % Final  °• Platelets 04/14/2019 328  150 - 400 K/uL Final  °• nRBC 04/14/2019 0.0  0.0 - 0.2 % Final  °• Neutrophils Relative % 04/14/2019 69  % Final  °• Neutro Abs 04/14/2019 10.0* 1.7 - 7.7 K/uL Final  °• Lymphocytes Relative 04/14/2019 23  % Final  °• Lymphs Abs 04/14/2019 3.3  0.7 - 4.0 K/uL Final  °• Monocytes Relative 04/14/2019 6  % Final  °• Monocytes Absolute 04/14/2019 0.9  0.1 - 1.0 K/uL Final  °• Eosinophils Relative 04/14/2019 1  % Final  °• Eosinophils Absolute 04/14/2019 0.1  0.0 - 0.5 K/uL Final  °• Basophils Relative 04/14/2019 0  % Final  °• Basophils Absolute 04/14/2019 0.0  0.0 - 0.1 K/uL Final  °• Immature Granulocytes 04/14/2019 1  % Final  °• Abs Immature Granulocytes 04/14/2019 0.11* 0.00 - 0.07 K/uL Final  ° Performed at [place] [facility], [address] [place], [place] [address]  ° ° °Assessment:  Arthur Bowen is a 78 y.o. male  with anemia of chronic renal disease and B12 deficiency. He has focal segmental glomerulosclerosis (FSGS).  Due to worsening kidney function, Cellcept was discontinued.  °  °He received Procrit 50,000 units monthly through 02/20/2018.  He began Retacrit on 08/08/2018  (50,000 units).  In March 2020, his counts continued to be low and   and frequency was increased to every 14 days by Honor Loh, NP. He last received Retacrit 40,000 units on 03/18/2019.   Hemoglobin has been followed. 9.0 on 08/14/2018, 7.8 on 09/05/2018), 8.3 on 09/19/2018, 9.6 on 11/06/2018, 10.3 on 12/23/2018, 9.9 on 01/20/2019, 10.1 on 02/17/2019, and 9.1 on 03/17/2019.  He has B12 deficiencyand continues monthly home b12 injections. Last B12 level was 629 on 08/07/2018. Intrinsic factor antibody and antiparietal antibody were negative on 08/06/2018.   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 Margins were not clear at that time and was reexcised by dermatology on 10/02/2014. He is followed by dermatology.  He has a left renal mass s/p CT-guided biopsy on 09/15/2012. Pathology was negative. Ultrasound of kidneys on 11/18/2017 revealed a stable 5.1 cm left kidney mass. He is followed by Dr. Erlene Quan.  Abdomen and pelvis CT on 10/10/2018 revealed cholelithiasis without inflammation.  There was no hydronephrosis, renal obstruction, renal or ureteral calculi.  There was a stable 4.8 cm complex and peripherally calcified exophytic lesion arising from midpole of left kidney most c/w benign complex or proteinaceous cyst.  There was mild prostatic enlargement.  [facility] symptomatic bradycardiathought to be due to beta-blocker and symptomatic anemia. Hemoglobin was 6.4 and he received 2 units of PRBCs. Work-up revealed ferritin 247, iron saturation 33%, folate normal. B12 was 311 (was previously 6177 on 10/24/17)and he began B12injections.  He was again admitted [facility] on 08/06/2018 - 02/20/2020for anemia. Hemoglobin was 5.8. Ferritin was 350. B12was 629. Folate was 7.5. Received 3 units of PRBCs.  Symptomatically, he is doing well.  He has ongoing issues with  hypertension.  Exam is stable.  Ferritin is 83 with an iron saturation of 29%.  Plan: 1.   Labs today: CBC with diff, BMP, ferritin, iron studies. 2.   Anemia of chronic kidney disease Hematocrit 32.3.  Hemoglobin 9.9.  MCV 93.1.  Hemoglobin goal is >= 10. He is doing well on the current Retacrit regimen. Postpone Retacrit today secondary to hypertension. Plan Retacrit for tomorrow if BP improved.  3.   Stage III chronic kidney disease BUN46and creatinine2.30 on 01/20/2019. BUN 55 and creatinine 3.16 on 04/14/2019. Send note to Dr. Holley Raring. Continue to monitor. 4.   B12 deficiency Patient continues monthly B12 at home. Folate was 10.1 on 11/06/2018. Check folate annually. 5.RTC every 4 weeks x 3 for HCT/Hgb and +/- Retacrit. 6.   RTC in 3 months for MD assessment, labs (CBC with diff, BMP, ferritin), and +/- Retacrit.    I discussed the assessment and treatment plan with the patient.  The patient was provided an opportunity to ask questions and all were answered.  The patient agreed with the plan and demonstrated an understanding of the instructions.  The patient was advised to call back if the symptoms worsen or if the condition fails to improve as anticipated.   Lequita Asal, MD, PhD    04/14/2019, 10:46 AM  I, Selena Batten, am acting as scribe for [employer]. Mike Gip, MD, PhD.  I, Melissa C. Mike Gip, MD, have reviewed the above documentation for accuracy and completeness, and I agree with the above.

## 2019-04-14 ENCOUNTER — Telehealth: Payer: Self-pay

## 2019-04-14 ENCOUNTER — Other Ambulatory Visit: Payer: Self-pay | Admitting: Hematology and Oncology

## 2019-04-14 ENCOUNTER — Other Ambulatory Visit: Payer: Self-pay

## 2019-04-14 ENCOUNTER — Inpatient Hospital Stay: Payer: Medicare Other | Attending: Hematology and Oncology | Admitting: Hematology and Oncology

## 2019-04-14 ENCOUNTER — Inpatient Hospital Stay: Payer: Medicare Other

## 2019-04-14 ENCOUNTER — Encounter: Payer: Self-pay | Admitting: Hematology and Oncology

## 2019-04-14 VITALS — BP 190/72 | HR 77 | Temp 98.3°F | Resp 16 | Wt 206.9 lb

## 2019-04-14 DIAGNOSIS — N184 Chronic kidney disease, stage 4 (severe): Secondary | ICD-10-CM

## 2019-04-14 DIAGNOSIS — I129 Hypertensive chronic kidney disease with stage 1 through stage 4 chronic kidney disease, or unspecified chronic kidney disease: Secondary | ICD-10-CM | POA: Diagnosis not present

## 2019-04-14 DIAGNOSIS — D631 Anemia in chronic kidney disease: Secondary | ICD-10-CM

## 2019-04-14 DIAGNOSIS — E538 Deficiency of other specified B group vitamins: Secondary | ICD-10-CM | POA: Diagnosis not present

## 2019-04-14 DIAGNOSIS — N189 Chronic kidney disease, unspecified: Secondary | ICD-10-CM

## 2019-04-14 DIAGNOSIS — N183 Chronic kidney disease, stage 3 unspecified: Secondary | ICD-10-CM | POA: Diagnosis not present

## 2019-04-14 DIAGNOSIS — D649 Anemia, unspecified: Secondary | ICD-10-CM

## 2019-04-14 LAB — IRON AND TIBC
Iron: 84 ug/dL (ref 45–182)
Saturation Ratios: 29 % (ref 17.9–39.5)
TIBC: 292 ug/dL (ref 250–450)
UIBC: 208 ug/dL

## 2019-04-14 LAB — CBC WITH DIFFERENTIAL/PLATELET
Abs Immature Granulocytes: 0.11 10*3/uL — ABNORMAL HIGH (ref 0.00–0.07)
Basophils Absolute: 0 10*3/uL (ref 0.0–0.1)
Basophils Relative: 0 %
Eosinophils Absolute: 0.1 10*3/uL (ref 0.0–0.5)
Eosinophils Relative: 1 %
HCT: 32.3 % — ABNORMAL LOW (ref 39.0–52.0)
Hemoglobin: 9.9 g/dL — ABNORMAL LOW (ref 13.0–17.0)
Immature Granulocytes: 1 %
Lymphocytes Relative: 23 %
Lymphs Abs: 3.3 10*3/uL (ref 0.7–4.0)
MCH: 28.5 pg (ref 26.0–34.0)
MCHC: 30.7 g/dL (ref 30.0–36.0)
MCV: 93.1 fL (ref 80.0–100.0)
Monocytes Absolute: 0.9 10*3/uL (ref 0.1–1.0)
Monocytes Relative: 6 %
Neutro Abs: 10 10*3/uL — ABNORMAL HIGH (ref 1.7–7.7)
Neutrophils Relative %: 69 %
Platelets: 328 10*3/uL (ref 150–400)
RBC: 3.47 MIL/uL — ABNORMAL LOW (ref 4.22–5.81)
RDW: 17.2 % — ABNORMAL HIGH (ref 11.5–15.5)
WBC: 14.5 10*3/uL — ABNORMAL HIGH (ref 4.0–10.5)
nRBC: 0 % (ref 0.0–0.2)

## 2019-04-14 LAB — BASIC METABOLIC PANEL
Anion gap: 6 (ref 5–15)
BUN: 55 mg/dL — ABNORMAL HIGH (ref 8–23)
CO2: 22 mmol/L (ref 22–32)
Calcium: 8.9 mg/dL (ref 8.9–10.3)
Chloride: 113 mmol/L — ABNORMAL HIGH (ref 98–111)
Creatinine, Ser: 3.16 mg/dL — ABNORMAL HIGH (ref 0.61–1.24)
GFR calc Af Amer: 20 mL/min — ABNORMAL LOW (ref >60)
GFR calc non Af Amer: 18 mL/min — ABNORMAL LOW (ref >60)
Glucose, Bld: 96 mg/dL (ref 70–99)
Potassium: 5 mmol/L (ref 3.5–5.1)
Sodium: 141 mmol/L (ref 135–145)

## 2019-04-14 LAB — FERRITIN: Ferritin: 83 ng/mL (ref 24–336)

## 2019-04-14 MED ORDER — B-12 1000 MCG/ML IJ KIT: 1000.0000 ug | PACK | INTRAMUSCULAR | 6 refills | Status: DC

## 2019-04-14 NOTE — Telephone Encounter (Signed)
In Basket sent to Dr. Army Melia, PCP to inform her of HTN episodes during OV today.

## 2019-04-14 NOTE — Progress Notes (Signed)
Pt here for follow up. Denies any concerns.  

## 2019-04-15 ENCOUNTER — Inpatient Hospital Stay: Payer: Medicare Other

## 2019-04-15 ENCOUNTER — Other Ambulatory Visit: Payer: Self-pay

## 2019-04-15 ENCOUNTER — Encounter: Payer: Self-pay | Admitting: Internal Medicine

## 2019-04-15 ENCOUNTER — Ambulatory Visit (INDEPENDENT_AMBULATORY_CARE_PROVIDER_SITE_OTHER): Payer: Medicare Other | Admitting: Internal Medicine

## 2019-04-15 VITALS — BP 180/70 | HR 96 | Ht 72.0 in | Wt 204.0 lb

## 2019-04-15 DIAGNOSIS — I10 Essential (primary) hypertension: Secondary | ICD-10-CM

## 2019-04-15 DIAGNOSIS — N184 Chronic kidney disease, stage 4 (severe): Secondary | ICD-10-CM

## 2019-04-15 DIAGNOSIS — I129 Hypertensive chronic kidney disease with stage 1 through stage 4 chronic kidney disease, or unspecified chronic kidney disease: Secondary | ICD-10-CM

## 2019-04-15 NOTE — Progress Notes (Signed)
Date:  04/15/2019   Name:  Arthur Bowen   DOB:  1941/02/18   MRN:  [id number]   Chief Complaint: Hypertension (b/p was 170/72 when he went to get shot this am)  Hypertension This is a chronic problem. The problem is uncontrolled (was high today at Hematology so couldnt get infusion). Pertinent negatives include no chest pain, headaches, palpitations or shortness of breath.  He was seen several weeks ago by Dr. Holley Raring and hydralazine was increased.  He was already on lisinopril 20 mg and amlodipine 5 mg.   Review of Systems  Constitutional: Negative for chills, fatigue and fever.  HENT: Negative for trouble swallowing.   Eyes: Negative for visual disturbance.  Respiratory: Negative for cough, chest tightness and shortness of breath.   Cardiovascular: Negative for chest pain, palpitations and leg swelling.  Gastrointestinal: Negative for abdominal pain.  Neurological: Negative for dizziness, weakness, light-headedness and headaches.  Psychiatric/Behavioral: Negative for dysphoric mood and sleep disturbance. The patient is not nervous/anxious.     Patient Active Problem List   Diagnosis Date Noted  . B12 deficiency 08/06/2018  . Anemia 08/06/2018  . Anemia due to stage 4 chronic kidney disease ([place]) 07/24/2018  . Chronic kidney disease   . Bradycardia 06/05/2018  . Symptomatic anemia 11/21/2017  . Secondary hyperparathyroidism of renal origin ([place]) 10/15/2017  . CKD stage 4 secondary to hypertension ([place]) 10/09/2017  . Primary osteoarthritis of right knee 01/09/2017  . Elevated PSA 08/14/2015  . Renal mass 08/14/2015  . BPH with obstruction/lower urinary tract symptoms 08/14/2015  . History of hematuria 08/14/2015  . Coronary artery disease involving native coronary artery of native heart with angina pectoris ([place]) 04/06/2015  . Dyslipidemia 04/06/2015  . Essential (primary) hypertension 04/06/2015  . Neuropathy 04/06/2015  . Peripheral vascular disease ([place]) 04/06/2015  .  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 Senile purpura ([place]) 04/06/2015    Allergies  Allergen Reactions  . Penicillins Other (See Comments)  . Sulfa Antibiotics Other (See Comments)    Past Surgical History:  Procedure Laterality Date  . Circumscision  09/2015  . CORONARY ARTERY BYPASS GRAFT  2002   x3 @ [place]  . INGUINAL HERNIA REPAIR Left 2011  . RENAL BIOPSY  2014   neg in w/u renal mass/hematuria    Social History   Tobacco Use  . Smoking status: Former Smoker    Packs/day: 0.50    Years: 50.00    Pack years: 25.00    Types: Cigarettes    Quit date: 10/10/2017    Years since quitting: 1.5  . Smokeless tobacco: Former Systems developer    Types: Chew  . Tobacco comment: last cig 10/10/17   Substance Use Topics  . Alcohol use: No    Alcohol/week: 0.0 standard drinks  . Drug use: No     Medication list has been reviewed and updated.  Current Meds  Medication Sig  . amLODipine (NORVASC) 5 MG tablet Take 1 tablet (5 mg total) by mouth daily.  [place] Kitchen aspirin 81 MG tablet Take 1 tablet by mouth daily. Reported on 09/02/2015  . atorvastatin (LIPITOR) 40 MG tablet Take 1 tablet by mouth once daily  . calcitRIOL (ROCALTROL) 0.25 MCG capsule Take 0.25 mcg by mouth daily.  . Cyanocobalamin (B-12) 1000 MCG/ML KIT Inject 1,000 mcg as directed every 30 (thirty) days.  . finasteride (PROSCAR) 5 MG tablet Take 1 tablet by mouth once daily  . hydrALAZINE (APRESOLINE) 25 MG tablet Take 25 mg by mouth  3 (three) times daily.  . hydrALAZINE (APRESOLINE) 50 MG tablet Take 75 mg by mouth 3 (three) times daily.  [place] Kitchen lisinopril (ZESTRIL) 20 MG tablet Take 1 tablet by mouth once daily  . predniSONE (DELTASONE) 10 MG tablet Take 10 mg by mouth daily with breakfast.     PHQ 2/9 Scores 04/15/2019 06/05/2018 06/05/2018 12/02/2017  PHQ - 2 Score 0 0 0 0  PHQ- 9 Score 0 - - -    BP Readings from Last 3 Encounters:  04/15/19 (!) 180/70  04/15/19 (!) 170/70  04/14/19 (!) 190/72    Physical  Exam Vitals signs and nursing note reviewed.  Constitutional:      General: He is not in acute distress.    Appearance: Normal appearance. He is well-developed.  HENT:     Head: Normocephalic and atraumatic.  Neck:     Musculoskeletal: Normal range of motion.  Cardiovascular:     Rate and Rhythm: Normal rate and regular rhythm.  Pulmonary:     Effort: Pulmonary effort is normal. No respiratory distress.     Breath sounds: No wheezing or rhonchi.  Musculoskeletal:     Right lower leg: No edema.     Left lower leg: No edema.  Skin:    General: Skin is warm and dry.     Findings: No rash.  Neurological:     General: No focal deficit present.     Mental Status: He is alert and oriented to person, place, and time.  Psychiatric:        Behavior: Behavior normal.        Thought Content: Thought content normal.     Wt Readings from Last 3 Encounters:  04/15/19 204 lb (92.5 kg)  04/14/19 206 lb 14.4 oz (93.9 kg)  01/20/19 197 lb 6.8 oz (89.5 kg)    BP (!) 180/70   Pulse 96   Ht 6' (1.829 m)   Wt 204 lb (92.5 kg)   SpO2 98%   BMI 27.67 kg/m   Assessment and Plan: 1. Essential (primary) hypertension BP is uncontrolled on current regimen of hydralazine 75 mg tid, lisinopril 20 mg and amlodipine 5 mg Will increase amlodipine to 10 mg since pt is without edema or constipation Follow up BP with me or Nephrology is 4-6 weeks  2. CKD stage 4 secondary to hypertension [facility]) Also focal segmental glomerulosclerosis Followed by Nephrology  Partially dictated using [place]. Any errors are unintentional.  Halina Maidens, MD [facility]  04/15/2019

## 2019-04-15 NOTE — Patient Instructions (Signed)
Double up and take 2 amlodipine a day - call for a new prescription. Please see me or Dr. Holley Raring in the next 4-6 weeks to follow up

## 2019-04-15 NOTE — Patient Instructions (Signed)

## 2019-04-15 NOTE — Progress Notes (Signed)
Patient here this morning for a re-check on elevated blood pressure.  BP:  170/72, 164/70, 170/70.  Patient advised to make appointment with Dr. Carolin Coy.  Patient voiced understanding and will have his daughter make his appointment.  Patient unable to receive his Retacrit today and MD informed.  Patient to let us know when his BP is under control.

## 2019-04-17 ENCOUNTER — Inpatient Hospital Stay: Payer: Medicare Other

## 2019-04-17 ENCOUNTER — Other Ambulatory Visit: Payer: Self-pay

## 2019-04-17 VITALS — BP 160/70 | Temp 97.6°F | Resp 18

## 2019-04-17 DIAGNOSIS — D631 Anemia in chronic kidney disease: Secondary | ICD-10-CM | POA: Diagnosis not present

## 2019-04-17 DIAGNOSIS — N183 Chronic kidney disease, stage 3 unspecified: Secondary | ICD-10-CM | POA: Diagnosis not present

## 2019-04-17 DIAGNOSIS — N184 Chronic kidney disease, stage 4 (severe): Secondary | ICD-10-CM

## 2019-04-17 DIAGNOSIS — I129 Hypertensive chronic kidney disease with stage 1 through stage 4 chronic kidney disease, or unspecified chronic kidney disease: Secondary | ICD-10-CM | POA: Diagnosis not present

## 2019-04-17 DIAGNOSIS — D649 Anemia, unspecified: Secondary | ICD-10-CM

## 2019-04-17 DIAGNOSIS — E538 Deficiency of other specified B group vitamins: Secondary | ICD-10-CM | POA: Diagnosis not present

## 2019-04-17 MED ORDER — EPOETIN ALFA-EPBX 40000 UNIT/ML IJ SOLN
40000.0000 [IU] | Freq: Once | INTRAMUSCULAR | Status: AC
  Administered 2019-04-17: 40000 [IU] via SUBCUTANEOUS

## 2019-04-17 NOTE — Patient Instructions (Signed)

## 2019-05-11 ENCOUNTER — Other Ambulatory Visit: Payer: Self-pay

## 2019-05-12 ENCOUNTER — Telehealth: Payer: Self-pay

## 2019-05-12 ENCOUNTER — Inpatient Hospital Stay: Payer: Medicare Other | Attending: Hematology and Oncology

## 2019-05-12 ENCOUNTER — Other Ambulatory Visit: Payer: Self-pay

## 2019-05-12 ENCOUNTER — Inpatient Hospital Stay: Payer: Medicare Other

## 2019-05-12 DIAGNOSIS — N184 Chronic kidney disease, stage 4 (severe): Secondary | ICD-10-CM | POA: Insufficient documentation

## 2019-05-12 DIAGNOSIS — E538 Deficiency of other specified B group vitamins: Secondary | ICD-10-CM

## 2019-05-12 DIAGNOSIS — D631 Anemia in chronic kidney disease: Secondary | ICD-10-CM | POA: Insufficient documentation

## 2019-05-12 LAB — HEMOGLOBIN AND HEMATOCRIT, BLOOD
HCT: 32.9 % — ABNORMAL LOW (ref 39.0–52.0)
Hemoglobin: 10 g/dL — ABNORMAL LOW (ref 13.0–17.0)

## 2019-05-12 NOTE — Progress Notes (Signed)
BP elevated. Had patient sit for approx. 20 minutes before checking again. BP remained out of parameter for Retacrit. Patient states he did not want to sit any longer. Informed patient he could come back in the morning. Pt. States HGB "is the highest it has ever been" and does not feel like he needs to get it and has to arrange transportation with each visit. Informed patient if he changed his mind to come to the office in the AM. Patient verbalizes understanding and denies any further questions or concerns.

## 2019-05-12 NOTE — Telephone Encounter (Signed)
Pt daughter Barnett Applebaum called stating pt was seen by our office a month ago for uncontrolled htn. We increased his amlodipine from 5 mg to 10 mg and he is still taking his hydralazine TID, and Lisinopril once daily. He went to his oncology injection appt today and his BP was elevated still. Even after rechecking with pt resting for 20 mins. Wants to know if there is another change we should make to medications since BP still elevated.   Please advise.

## 2019-05-12 NOTE — Telephone Encounter (Signed)
Because of his kidney disease, I need for him to see Nephrology for managing his high blood pressure.  When is his next appt there?

## 2019-05-12 NOTE — Telephone Encounter (Signed)
Pt daughter agreed and will call Dr Holley Raring office tomorrow.

## 2019-05-13 ENCOUNTER — Inpatient Hospital Stay: Payer: Medicare Other

## 2019-05-13 VITALS — BP 151/74 | HR 82 | Temp 96.0°F | Resp 18

## 2019-05-13 DIAGNOSIS — D649 Anemia, unspecified: Secondary | ICD-10-CM

## 2019-05-13 DIAGNOSIS — I129 Hypertensive chronic kidney disease with stage 1 through stage 4 chronic kidney disease, or unspecified chronic kidney disease: Secondary | ICD-10-CM

## 2019-05-13 DIAGNOSIS — D631 Anemia in chronic kidney disease: Secondary | ICD-10-CM | POA: Diagnosis not present

## 2019-05-13 DIAGNOSIS — N184 Chronic kidney disease, stage 4 (severe): Secondary | ICD-10-CM

## 2019-05-13 MED ORDER — EPOETIN ALFA-EPBX 40000 UNIT/ML IJ SOLN
40000.0000 [IU] | Freq: Once | INTRAMUSCULAR | Status: AC
  Administered 2019-05-13: 40000 [IU] via SUBCUTANEOUS

## 2019-05-13 NOTE — Patient Instructions (Signed)

## 2019-05-15 ENCOUNTER — Other Ambulatory Visit: Payer: Self-pay | Admitting: Internal Medicine

## 2019-05-15 DIAGNOSIS — E785 Hyperlipidemia, unspecified: Secondary | ICD-10-CM

## 2019-05-21 DIAGNOSIS — D631 Anemia in chronic kidney disease: Secondary | ICD-10-CM | POA: Diagnosis not present

## 2019-05-21 DIAGNOSIS — N041 Nephrotic syndrome with focal and segmental glomerular lesions: Secondary | ICD-10-CM | POA: Diagnosis not present

## 2019-05-21 DIAGNOSIS — I10 Essential (primary) hypertension: Secondary | ICD-10-CM | POA: Diagnosis not present

## 2019-05-21 DIAGNOSIS — N184 Chronic kidney disease, stage 4 (severe): Secondary | ICD-10-CM | POA: Diagnosis not present

## 2019-05-21 DIAGNOSIS — N2581 Secondary hyperparathyroidism of renal origin: Secondary | ICD-10-CM | POA: Diagnosis not present

## 2019-05-29 DIAGNOSIS — N184 Chronic kidney disease, stage 4 (severe): Secondary | ICD-10-CM | POA: Diagnosis not present

## 2019-06-08 ENCOUNTER — Other Ambulatory Visit: Payer: Self-pay

## 2019-06-09 ENCOUNTER — Inpatient Hospital Stay: Payer: Medicare Other

## 2019-06-09 ENCOUNTER — Inpatient Hospital Stay: Payer: Medicare Other | Attending: Hematology and Oncology

## 2019-06-09 VITALS — BP 160/70 | Resp 18

## 2019-06-09 DIAGNOSIS — N184 Chronic kidney disease, stage 4 (severe): Secondary | ICD-10-CM | POA: Insufficient documentation

## 2019-06-09 DIAGNOSIS — D631 Anemia in chronic kidney disease: Secondary | ICD-10-CM | POA: Diagnosis not present

## 2019-06-09 DIAGNOSIS — D649 Anemia, unspecified: Secondary | ICD-10-CM

## 2019-06-09 DIAGNOSIS — I129 Hypertensive chronic kidney disease with stage 1 through stage 4 chronic kidney disease, or unspecified chronic kidney disease: Secondary | ICD-10-CM

## 2019-06-09 DIAGNOSIS — E538 Deficiency of other specified B group vitamins: Secondary | ICD-10-CM

## 2019-06-09 LAB — HEMOGLOBIN AND HEMATOCRIT, BLOOD
HCT: 34.2 % — ABNORMAL LOW (ref 39.0–52.0)
Hemoglobin: 10.7 g/dL — ABNORMAL LOW (ref 13.0–17.0)

## 2019-06-09 MED ORDER — EPOETIN ALFA-EPBX 40000 UNIT/ML IJ SOLN
40000.0000 [IU] | Freq: Once | INTRAMUSCULAR | Status: AC
  Administered 2019-06-09: 40000 [IU] via SUBCUTANEOUS

## 2019-06-09 NOTE — Patient Instructions (Signed)

## 2019-06-10 ENCOUNTER — Encounter: Payer: Medicare Other | Admitting: Internal Medicine

## 2019-06-18 ENCOUNTER — Ambulatory Visit: Payer: Medicare Other | Attending: Internal Medicine

## 2019-06-18 DIAGNOSIS — Z20822 Contact with and (suspected) exposure to covid-19: Secondary | ICD-10-CM

## 2019-06-21 ENCOUNTER — Telehealth: Payer: Self-pay

## 2019-06-21 NOTE — Telephone Encounter (Signed)
Pt's daughter in law called for pt's covid results advised that results are not back.

## 2019-06-24 LAB — NOVEL CORONAVIRUS, NAA

## 2019-06-29 DIAGNOSIS — N184 Chronic kidney disease, stage 4 (severe): Secondary | ICD-10-CM | POA: Diagnosis not present

## 2019-07-02 NOTE — Progress Notes (Signed)
[facility]  [address] [place], [place] [address] Phone: [phone]  Fax: [phone]   Clinic Day:  07/07/2019  Referring physician: Glean Hess, MD  Chief Complaint: Arthur Bowen is a 79 y.o. male with anemia of chronic kidney disease and B12 deficiency who is seen for 3 month assessment.  HPI: The patient was last seen in the hematology clinic on 04/14/2019. At that time, he was doing well. He had issues with hypertension. Exam was stable. Hematocrit was 32.3 and hemoglobin 9.9, MCV 93.1, platelets 328,000, WBC 14,500 (ANC 10,000; ALC 3,300). Ferritin was 83 with an iron saturation of 29%. Retacrit was postponed secondary to hypertension and was rescheduled for the following day if his BP improved.   Patient returned to clinic on 04/15/2019 for a BP recheck. BP was 170/72, 164/70, and 170/70. He was unable to received Retacrit and was advised to see Dr. Carolin Coy.   He was seen by Dr. Carolin Coy on 04/15/2019. As his blood pressure was uncontrolled on hydralazine 75 mg TID, lisinopril 20 mg and amlodipine 5 mg, his amlodipine was increased to 10 mg/day.   He was last seen by Dr. Holley Raring on 05/21/2019.  He was noted to have stage IV kidney disease which had progressed over time.  GFR was 21 ml/min with a UPC ratio of 4.9.  He was to be maintained on lisinopril 20 mg a day for proteinuria suppression.He was to be maintained on hydralazine and amlodipine for additional blood pressure control.  He has a follow-up around 08/19/2019.  Labs followed: 05/12/2019: Hematocrit 32.9. Hemoglobin 10.0. 06/09/2019: Hematocrit 34.2. Hemoglobin 10.7.   He received Retacrit on 05/13/2019, 06/09/2019.   During the interim, he has felt "fair". His BP is 180/68 in the clinic today. He notes his systolic blood pressure at home stays in the 150's. He denies any headaches. He has occasional blurry vision. He denies any chest pain or leg swelling. He has no back pain.  He continues to have frequent nocturia. His shortness of breath has improved.    Past Medical History:  Diagnosis Date  . Acquired phimosis   . BPH (benign prostatic hypertrophy)   . Chronic kidney disease   . Dyspnea    with exertion  . Elevated PSA   . Gross hematuria   . Hematuria   . History of hiatal hernia   . Hypertension   . 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 Renal mass, left     Past Surgical History:  Procedure Laterality Date  . Circumscision  09/2015  . CORONARY ARTERY BYPASS GRAFT  2002   x3 @ [place]  . INGUINAL HERNIA REPAIR Left 2011  . RENAL BIOPSY  2014   neg in w/u renal mass/hematuria    Family History  Problem Relation Age of Onset  . Lymphoma Mother   . Hodgkin's lymphoma Mother   . Lung cancer Father   . Heart disease Brother   . Dementia Sister   . Dementia Brother   . Breast cancer Maternal Aunt     Social History:  reports that he quit smoking about 20 months ago. His smoking use included cigarettes. He has a 25.00 pack-year smoking history. He has quit using smokeless tobacco.  His smokeless tobacco use included chew. He reports that he does not drink alcohol or use drugs. His granddaughter is a EMT and she is going to school to be a Marine scientist. He notes she will finish nursing school  in x 2 years. He lives in [place]. The patient is alone today.  Allergies:  Allergies  Allergen Reactions  . Penicillins Other (See Comments)  . Sulfa Antibiotics Other (See Comments)    Current Medications: Current Outpatient Medications  Medication Sig Dispense Refill  . amLODipine (NORVASC) 5 MG tablet Take 1 tablet (5 mg total) by mouth daily. 90 tablet 1  . aspirin 81 MG tablet Take 1 tablet by mouth daily. Reported on 09/02/2015    . atorvastatin (LIPITOR) 40 MG tablet Take 1 tablet by mouth once daily 90 tablet 0  . calcitRIOL (ROCALTROL) 0.25 MCG capsule Take 0.25 mcg by mouth daily.    . Cyanocobalamin (B-12) 1000 MCG/ML KIT Inject 1,000  mcg as directed every 30 (thirty) days. 1 kit 6  . finasteride (PROSCAR) 5 MG tablet Take 1 tablet by mouth once daily 90 tablet 3  . hydrALAZINE (APRESOLINE) 25 MG tablet Take 25 mg by mouth 3 (three) times daily.    . hydrALAZINE (APRESOLINE) 50 MG tablet Take 75 mg by mouth 3 (three) times daily.    Marland Kitchen lisinopril (ZESTRIL) 20 MG tablet Take 1 tablet by mouth once daily 90 tablet 0  . nitroGLYCERIN (NITROSTAT) 0.4 MG SL tablet Place 1 tablet (0.4 mg total) under the tongue every 5 (five) minutes as needed for chest pain. 50 tablet 3  . predniSONE (DELTASONE) 10 MG tablet Take 10 mg by mouth daily with breakfast.      No current facility-administered medications for this visit.    Review of Systems  Constitutional: Negative.  Negative for chills, diaphoresis, fever, malaise/fatigue and weight loss (1 pound).       Feels "fair".  HENT: Negative.  Negative for congestion, ear discharge, ear pain, hearing loss, nosebleeds, sinus pain and sore throat.   Eyes: Positive for blurred vision (occasional). Negative for double vision, photophobia and pain.  Respiratory: Negative.  Negative for cough, hemoptysis, sputum production, shortness of breath and wheezing.   Cardiovascular: Negative.  Negative for chest pain, palpitations, orthopnea, leg swelling and PND.  Gastrointestinal: Negative.  Negative for abdominal pain, blood in stool, constipation, diarrhea, melena, nausea and vomiting.  Genitourinary: Negative for dysuria, frequency, hematuria and urgency.       Nocturia.  Musculoskeletal: Negative for back pain, falls, joint pain and myalgias.  Skin: Negative.  Negative for rash.  Neurological: Negative.  Negative for dizziness, tremors, speech change, focal weakness, seizures, weakness and headaches.  Endo/Heme/Allergies: Negative.  Does not bruise/bleed easily.  Psychiatric/Behavioral: Negative.  Negative for depression, hallucinations, memory loss and suicidal ideas. The patient is not  nervous/anxious and does not have insomnia.   All other systems reviewed and are negative.  Performance status (ECOG): 1  Vitals Blood pressure (!) 180/68, pulse 92, temperature (!) 97.1 F (36.2 C), temperature source Tympanic, resp. rate 16, weight 205 lb 0.4 oz (93 kg), SpO2 98 %.   Physical Exam  Constitutional: He is oriented to person, place, and time. He appears well-developed and well-nourished. No distress.  HENT:  Head: Normocephalic and atraumatic.  Mouth/Throat: Oropharynx is clear and moist. No oropharyngeal exudate.  Short gray hair. Male pattern baldness.  Mask.  Eyes: Pupils are equal, round, and reactive to light. Conjunctivae and EOM are normal. No scleral icterus.  Glasses.  Neck: No JVD present.  Cardiovascular: Normal rate, regular rhythm and normal heart sounds.  No murmur heard. Pulmonary/Chest: Effort normal and breath sounds normal. No respiratory distress. He has no wheezes. He has no rales.  Abdominal: Soft. Bowel sounds are normal. He exhibits no distension and no mass. There is no hepatosplenomegaly. There is no abdominal tenderness. There is no rebound and no guarding.  Musculoskeletal:        General: No edema. Normal range of motion.     Cervical back: Normal range of motion and neck supple.  Lymphadenopathy:       Head (right side): No preauricular, no posterior auricular and no occipital adenopathy present.       Head (left side): No preauricular, no posterior auricular and no occipital adenopathy present.    He has no cervical adenopathy.    He has no axillary adenopathy.       Right: No inguinal and no supraclavicular adenopathy present.       Left: No inguinal and no supraclavicular adenopathy present.  Neurological: He is alert and oriented to person, place, and time.  Skin: Skin is warm and dry. No rash noted. He is not diaphoretic. No erythema. No pallor.  Psychiatric: He has a normal mood and affect. His behavior is normal. Judgment and  thought content normal.  Nursing note and vitals reviewed.   Appointment on 07/07/2019  Component Date Value Ref Range Status  . Sodium 07/07/2019 140  135 - 145 mmol/L Final  . Potassium 07/07/2019 4.2  3.5 - 5.1 mmol/L Final  . Chloride 07/07/2019 113* 98 - 111 mmol/L Final  . CO2 07/07/2019 17* 22 - 32 mmol/L Final  . Glucose, Bld 07/07/2019 180* 70 - 99 mg/dL Final  . BUN 07/07/2019 51* 8 - 23 mg/dL Final  . Creatinine, Ser 07/07/2019 3.63* 0.61 - 1.24 mg/dL Final  . Calcium 07/07/2019 8.9  8.9 - 10.3 mg/dL Final  . GFR calc non Af Amer 07/07/2019 15* >60 mL/min Final  . GFR calc Af Amer 07/07/2019 17* >60 mL/min Final  . Anion gap 07/07/2019 10  5 - 15 Final   Performed at [facility], [address], [place], [place] [address]  . WBC 07/07/2019 11.3* 4.0 - 10.5 K/uL Final  . RBC 07/07/2019 3.80* 4.22 - 5.81 MIL/uL Final  . Hemoglobin 07/07/2019 10.9* 13.0 - 17.0 g/dL Final  . HCT 07/07/2019 35.2* 39.0 - 52.0 % Final  . MCV 07/07/2019 92.6  80.0 - 100.0 fL Final  . MCH 07/07/2019 28.7  26.0 - 34.0 pg Final  . MCHC 07/07/2019 31.0  30.0 - 36.0 g/dL Final  . RDW 07/07/2019 16.0* 11.5 - 15.5 % Final  . Platelets 07/07/2019 348  150 - 400 K/uL Final  . nRBC 07/07/2019 0.0  0.0 - 0.2 % Final  . Neutrophils Relative % 07/07/2019 58  % Final  . Neutro Abs 07/07/2019 6.6  1.7 - 7.7 K/uL Final  . Lymphocytes Relative 07/07/2019 35  % Final  . Lymphs Abs 07/07/2019 3.9  0.7 - 4.0 K/uL Final  . Monocytes Relative 07/07/2019 5  % Final  . Monocytes Absolute 07/07/2019 0.5  0.1 - 1.0 K/uL Final  . Eosinophils Relative 07/07/2019 1  % Final  . Eosinophils Absolute 07/07/2019 0.1  0.0 - 0.5 K/uL Final  . Basophils Relative 07/07/2019 0  % Final  . Basophils Absolute 07/07/2019 0.0  0.0 - 0.1 K/uL Final  . Immature Granulocytes 07/07/2019 1  % Final  . Abs Immature Granulocytes 07/07/2019 0.10* 0.00 - 0.07 K/uL Final   Performed at [facility], [address] [place],  [address]    Assessment:  Arthur Bowen is  a 79 y.o. male withanemia of chronic renal disease and B12 deficiency. He has focal segmental glomerulosclerosis (FSGS).  Due to worsening kidney function, Cellcept was discontinued.   He received Procrit 50,000 units monthly through 02/20/2018.  He began Retacrit on 08/08/2018 (50,000 units).  In 03/020, hemoglobin remained low and frequency was increased to every 14 days.  He has received Retacrit 40,000 units every 4 weeks since 11/25/2018 (last 06/09/2019).   Hemoglobin has been followed. 9.0 on 08/14/2018, 7.8 on 09/05/2018), 8.3 on 09/19/2018, 9.6 on 11/06/2018, 10.3 on 12/23/2018, 9.9 on 01/20/2019, 10.1 on 02/17/2019, 9.1 on 03/17/2019, 9.9 on 04/14/2019, 10.0 on 05/12/2019, 10.7 on 06/09/2019, and 10.9 on 07/07/2019.  He has B12 deficiencyand continues monthly home b12 injections. Last B12 level was 629 on 08/07/2018. Intrinsic factor antibody and antiparietal antibody were negative on 08/06/2018.   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 Margins were not clear at that time and was reexcised by dermatology on 10/02/2014. He is followed by dermatology.  He has a left renal mass s/p CT-guided biopsy on 09/15/2012. Pathology was negative. Ultrasound of kidneys on 11/18/2017 revealed a stable 5.1 cm left kidney mass. He is followed by Dr. Erlene Quan.  Abdomen and pelvis CT on 10/10/2018 revealed cholelithiasis without inflammation.  There was no hydronephrosis, renal obstruction, renal or ureteral calculi.  There was a stable 4.8 cm complex and peripherally calcified exophytic lesion arising from midpole of left kidney most c/w benign complex or proteinaceous cyst.  There was mild prostatic enlargement.  [facility] symptomatic bradycardiathought to be due to beta-blocker and symptomatic anemia.  Hemoglobin was 6.4 and he received 2 units of PRBCs. Work-up revealed ferritin 247, iron saturation 33%, folate normal. B12 was 311 (previously 6177 on 10/24/2017)and he began B12injections.  He was again admitted [facility] on 08/06/2018 - 02/20/2020for anemia. Hemoglobin was 5.8. Ferritin was 350. B12was 629. Folate was 7.5. Received 3 units of PRBCs.  Symptomatically, he feels "fair".  Blood pressure is 180/68 today.  Hemoglobin is 10.9.   Plan: 1.   Labs today: CBC with diff, BMP, ferritin. 2.Anemia of chronic kidney disease Hematocrit 35.2. Hemoglobin 10.9. MCV 92.6.  Hemoglobingoal is>=10 (Retacrit if Hgb < 11). Hemoglobin is good today. Discuss concern for blood pressure.  RN to call Dr Gaspar Cola office about blood pressure. No Retacrit today secondary to hypertension. 3.Stage III chronic kidney disease BUN46and creatinine2.30 on 01/20/2019. BUN 55 and creatinine 3.16 on 04/14/2019. BUN 51 and creatinine 3.63 on 07/09/2019.  CrCl 17.8 ml/min. Note and labs to Dr Holley Raring. Continue to monitor closely. 4.B12 deficiency Patient receives monthly B12 at home. Folate was 10.1 on 11/06/2018. Plan to check folate after next visit (annually). 5.RTC every 4 weeks x 3 for HCT/Hgb and +/- Retacrit.  6.   RTC in 3 months for MD assessment, labs (CBC with diff, BMP, ferritin), and +/- Retacrit.   I discussed the assessment and treatment plan with the patient.  The patient was provided an opportunity to ask questions and all were answered.  The patient agreed with the plan and demonstrated an understanding of the instructions.  The patient was advised to call back if the symptoms worsen or if the condition fails to improve as anticipated.   Lequita Asal, MD, PhD    07/07/2019, 9:09 AM  I, Selena Batten, am acting as scribe for [employer]. Mike Gip, MD, PhD.  I, Morgane Joerger C.  Mike Gip, MD, have reviewed the above documentation for accuracy  and completeness, and I agree with the above.

## 2019-07-06 NOTE — Progress Notes (Signed)
Confirmed name and DOB. Denies any concerns.  

## 2019-07-07 ENCOUNTER — Encounter: Payer: Self-pay | Admitting: Hematology and Oncology

## 2019-07-07 ENCOUNTER — Telehealth: Payer: Self-pay

## 2019-07-07 ENCOUNTER — Other Ambulatory Visit: Payer: Self-pay

## 2019-07-07 ENCOUNTER — Inpatient Hospital Stay: Payer: Medicare Other | Attending: Hematology and Oncology

## 2019-07-07 ENCOUNTER — Inpatient Hospital Stay: Payer: Medicare Other

## 2019-07-07 ENCOUNTER — Inpatient Hospital Stay (HOSPITAL_BASED_OUTPATIENT_CLINIC_OR_DEPARTMENT_OTHER): Payer: Medicare Other | Admitting: Hematology and Oncology

## 2019-07-07 VITALS — BP 180/68 | HR 92 | Temp 97.1°F | Resp 16 | Wt 205.0 lb

## 2019-07-07 DIAGNOSIS — E538 Deficiency of other specified B group vitamins: Secondary | ICD-10-CM

## 2019-07-07 DIAGNOSIS — N184 Chronic kidney disease, stage 4 (severe): Secondary | ICD-10-CM

## 2019-07-07 DIAGNOSIS — I129 Hypertensive chronic kidney disease with stage 1 through stage 4 chronic kidney disease, or unspecified chronic kidney disease: Secondary | ICD-10-CM | POA: Diagnosis not present

## 2019-07-07 DIAGNOSIS — D631 Anemia in chronic kidney disease: Secondary | ICD-10-CM | POA: Diagnosis not present

## 2019-07-07 DIAGNOSIS — Z79899 Other long term (current) drug therapy: Secondary | ICD-10-CM | POA: Insufficient documentation

## 2019-07-07 DIAGNOSIS — N183 Chronic kidney disease, stage 3 unspecified: Secondary | ICD-10-CM | POA: Diagnosis not present

## 2019-07-07 LAB — CBC WITH DIFFERENTIAL/PLATELET
Abs Immature Granulocytes: 0.1 10*3/uL — ABNORMAL HIGH (ref 0.00–0.07)
Basophils Absolute: 0 10*3/uL (ref 0.0–0.1)
Basophils Relative: 0 %
Eosinophils Absolute: 0.1 10*3/uL (ref 0.0–0.5)
Eosinophils Relative: 1 %
HCT: 35.2 % — ABNORMAL LOW (ref 39.0–52.0)
Hemoglobin: 10.9 g/dL — ABNORMAL LOW (ref 13.0–17.0)
Immature Granulocytes: 1 %
Lymphocytes Relative: 35 %
Lymphs Abs: 3.9 10*3/uL (ref 0.7–4.0)
MCH: 28.7 pg (ref 26.0–34.0)
MCHC: 31 g/dL (ref 30.0–36.0)
MCV: 92.6 fL (ref 80.0–100.0)
Monocytes Absolute: 0.5 10*3/uL (ref 0.1–1.0)
Monocytes Relative: 5 %
Neutro Abs: 6.6 10*3/uL (ref 1.7–7.7)
Neutrophils Relative %: 58 %
Platelets: 348 10*3/uL (ref 150–400)
RBC: 3.8 MIL/uL — ABNORMAL LOW (ref 4.22–5.81)
RDW: 16 % — ABNORMAL HIGH (ref 11.5–15.5)
WBC: 11.3 10*3/uL — ABNORMAL HIGH (ref 4.0–10.5)
nRBC: 0 % (ref 0.0–0.2)

## 2019-07-07 LAB — BASIC METABOLIC PANEL
Anion gap: 10 (ref 5–15)
BUN: 51 mg/dL — ABNORMAL HIGH (ref 8–23)
CO2: 17 mmol/L — ABNORMAL LOW (ref 22–32)
Calcium: 8.9 mg/dL (ref 8.9–10.3)
Chloride: 113 mmol/L — ABNORMAL HIGH (ref 98–111)
Creatinine, Ser: 3.63 mg/dL — ABNORMAL HIGH (ref 0.61–1.24)
GFR calc Af Amer: 17 mL/min — ABNORMAL LOW (ref >60)
GFR calc non Af Amer: 15 mL/min — ABNORMAL LOW (ref >60)
Glucose, Bld: 180 mg/dL — ABNORMAL HIGH (ref 70–99)
Potassium: 4.2 mmol/L (ref 3.5–5.1)
Sodium: 140 mmol/L (ref 135–145)

## 2019-07-07 LAB — FERRITIN: Ferritin: 72 ng/mL (ref 24–336)

## 2019-07-07 NOTE — Telephone Encounter (Signed)
Stephanie from Oncology called and left a VM on my phone stating pt BP has been elevated his last 3 visits. Spoke with Pt daughter Arthur Bowen and informed her we need to see him for an appt. She reminded me that he see's Dr Holley Raring for his BP.   Arthur Bowen will follow up with Dr Holley Raring to discuss pts Bp and decide medication changes.

## 2019-07-07 NOTE — Telephone Encounter (Signed)
VM left on Dr. Kristin Bruins nurse line informing them of HTN episode during clinic. Details given of past visits as well and unable to give Retacrit before due to BP being elevated above standards. Number provided should nurse have any questions.

## 2019-07-07 NOTE — Telephone Encounter (Signed)
Lab has been faxed to Dr Holley Raring office.

## 2019-08-03 DIAGNOSIS — N2581 Secondary hyperparathyroidism of renal origin: Secondary | ICD-10-CM | POA: Diagnosis not present

## 2019-08-03 DIAGNOSIS — N184 Chronic kidney disease, stage 4 (severe): Secondary | ICD-10-CM | POA: Diagnosis not present

## 2019-08-03 DIAGNOSIS — I10 Essential (primary) hypertension: Secondary | ICD-10-CM | POA: Diagnosis not present

## 2019-08-03 DIAGNOSIS — D631 Anemia in chronic kidney disease: Secondary | ICD-10-CM | POA: Diagnosis not present

## 2019-08-04 ENCOUNTER — Other Ambulatory Visit: Payer: Self-pay

## 2019-08-04 ENCOUNTER — Inpatient Hospital Stay: Payer: Medicare Other | Attending: Hematology and Oncology

## 2019-08-04 ENCOUNTER — Inpatient Hospital Stay: Payer: Medicare Other

## 2019-08-04 VITALS — BP 159/71 | HR 80 | Temp 96.1°F | Resp 18

## 2019-08-04 DIAGNOSIS — D631 Anemia in chronic kidney disease: Secondary | ICD-10-CM | POA: Insufficient documentation

## 2019-08-04 DIAGNOSIS — I129 Hypertensive chronic kidney disease with stage 1 through stage 4 chronic kidney disease, or unspecified chronic kidney disease: Secondary | ICD-10-CM

## 2019-08-04 DIAGNOSIS — N184 Chronic kidney disease, stage 4 (severe): Secondary | ICD-10-CM | POA: Diagnosis not present

## 2019-08-04 DIAGNOSIS — E538 Deficiency of other specified B group vitamins: Secondary | ICD-10-CM

## 2019-08-04 DIAGNOSIS — D649 Anemia, unspecified: Secondary | ICD-10-CM

## 2019-08-04 LAB — CBC WITH DIFFERENTIAL/PLATELET
Abs Immature Granulocytes: 0.08 10*3/uL — ABNORMAL HIGH (ref 0.00–0.07)
Basophils Absolute: 0 10*3/uL (ref 0.0–0.1)
Basophils Relative: 0 %
Eosinophils Absolute: 0.1 10*3/uL (ref 0.0–0.5)
Eosinophils Relative: 1 %
HCT: 31 % — ABNORMAL LOW (ref 39.0–52.0)
Hemoglobin: 9.8 g/dL — ABNORMAL LOW (ref 13.0–17.0)
Immature Granulocytes: 1 %
Lymphocytes Relative: 26 %
Lymphs Abs: 3.5 10*3/uL (ref 0.7–4.0)
MCH: 29.7 pg (ref 26.0–34.0)
MCHC: 31.6 g/dL (ref 30.0–36.0)
MCV: 93.9 fL (ref 80.0–100.0)
Monocytes Absolute: 0.8 10*3/uL (ref 0.1–1.0)
Monocytes Relative: 6 %
Neutro Abs: 8.8 10*3/uL — ABNORMAL HIGH (ref 1.7–7.7)
Neutrophils Relative %: 66 %
Platelets: 275 10*3/uL (ref 150–400)
RBC: 3.3 MIL/uL — ABNORMAL LOW (ref 4.22–5.81)
RDW: 15.8 % — ABNORMAL HIGH (ref 11.5–15.5)
WBC: 13.3 10*3/uL — ABNORMAL HIGH (ref 4.0–10.5)
nRBC: 0 % (ref 0.0–0.2)

## 2019-08-04 LAB — SAMPLE TO BLOOD BANK

## 2019-08-04 MED ORDER — EPOETIN ALFA-EPBX 40000 UNIT/ML IJ SOLN
40000.0000 [IU] | Freq: Once | INTRAMUSCULAR | Status: AC
  Administered 2019-08-04: 40000 [IU] via SUBCUTANEOUS

## 2019-08-04 NOTE — Patient Instructions (Signed)

## 2019-08-08 ENCOUNTER — Ambulatory Visit: Payer: Medicare Other | Attending: Internal Medicine

## 2019-08-08 DIAGNOSIS — Z23 Encounter for immunization: Secondary | ICD-10-CM | POA: Insufficient documentation

## 2019-08-08 NOTE — Progress Notes (Signed)
   Covid-19 Vaccination Clinic  Name:  Arthur Bowen    MRN: [id number] DOB: 30-Oct-1940  08/08/2019  [name] was observed post Covid-19 immunization for 15 minutes without incidence. He was provided with Vaccine Information Sheet and instruction to access the V-Safe system.   [name] was instructed to call 911 with any severe reactions post vaccine: Marland Kitchen Difficulty breathing  . Swelling of your face and throat  . A fast heartbeat  . A bad rash all over your body  . Dizziness and weakness    Immunizations Administered    Name Date Dose VIS Date Route   Pfizer COVID-19 Vaccine 08/08/2019  8:56 AM 0.3 mL 05/29/2019 Intramuscular   Manufacturer: [place]   Lot: [id number]   [place]: [id number]

## 2019-08-12 ENCOUNTER — Other Ambulatory Visit: Payer: Self-pay | Admitting: Internal Medicine

## 2019-08-12 DIAGNOSIS — E785 Hyperlipidemia, unspecified: Secondary | ICD-10-CM

## 2019-08-19 ENCOUNTER — Ambulatory Visit (INDEPENDENT_AMBULATORY_CARE_PROVIDER_SITE_OTHER): Payer: Medicare Other | Admitting: Internal Medicine

## 2019-08-19 ENCOUNTER — Encounter: Payer: Self-pay | Admitting: Internal Medicine

## 2019-08-19 ENCOUNTER — Other Ambulatory Visit: Payer: Self-pay

## 2019-08-19 VITALS — BP 138/62 | HR 77 | Temp 97.7°F | Ht 72.0 in | Wt 200.0 lb

## 2019-08-19 DIAGNOSIS — I129 Hypertensive chronic kidney disease with stage 1 through stage 4 chronic kidney disease, or unspecified chronic kidney disease: Secondary | ICD-10-CM | POA: Diagnosis not present

## 2019-08-19 DIAGNOSIS — Z0000 Encounter for general adult medical examination without abnormal findings: Secondary | ICD-10-CM

## 2019-08-19 DIAGNOSIS — N2581 Secondary hyperparathyroidism of renal origin: Secondary | ICD-10-CM

## 2019-08-19 DIAGNOSIS — N401 Enlarged prostate with lower urinary tract symptoms: Secondary | ICD-10-CM

## 2019-08-19 DIAGNOSIS — I739 Peripheral vascular disease, unspecified: Secondary | ICD-10-CM

## 2019-08-19 DIAGNOSIS — D631 Anemia in chronic kidney disease: Secondary | ICD-10-CM

## 2019-08-19 DIAGNOSIS — D692 Other nonthrombocytopenic purpura: Secondary | ICD-10-CM

## 2019-08-19 DIAGNOSIS — I25119 Atherosclerotic heart disease of native coronary artery with unspecified angina pectoris: Secondary | ICD-10-CM | POA: Diagnosis not present

## 2019-08-19 DIAGNOSIS — E785 Hyperlipidemia, unspecified: Secondary | ICD-10-CM

## 2019-08-19 DIAGNOSIS — I10 Essential (primary) hypertension: Secondary | ICD-10-CM

## 2019-08-19 DIAGNOSIS — N184 Chronic kidney disease, stage 4 (severe): Secondary | ICD-10-CM

## 2019-08-19 DIAGNOSIS — K219 Gastro-esophageal reflux disease without esophagitis: Secondary | ICD-10-CM

## 2019-08-19 DIAGNOSIS — N138 Other obstructive and reflux uropathy: Secondary | ICD-10-CM

## 2019-08-19 LAB — POCT URINALYSIS DIPSTICK
Bilirubin, UA: NEGATIVE
Blood, UA: NEGATIVE
Glucose, UA: NEGATIVE
Ketones, UA: NEGATIVE
Leukocytes, UA: NEGATIVE
Nitrite, UA: NEGATIVE
Protein, UA: POSITIVE — AB
Spec Grav, UA: 1.02 (ref 1.010–1.025)
Urobilinogen, UA: 0.2 E.U./dL
pH, UA: 5 (ref 5.0–8.0)

## 2019-08-19 MED ORDER — FINASTERIDE 5 MG PO TABS: 5.0000 mg | ORAL_TABLET | Freq: Every day | ORAL | 3 refills | Status: AC

## 2019-08-19 MED ORDER — FAMOTIDINE 20 MG PO TABS: 20.0000 mg | ORAL_TABLET | Freq: Every day | ORAL | 3 refills | Status: AC

## 2019-08-19 NOTE — Progress Notes (Signed)
Date:  08/19/2019   Name:  Arthur Bowen   DOB:  May 12, 1941   MRN:  [id number]   Chief Complaint: Annual Exam (Wants prescription for acid reflux.) and Hypothyroidism (Document for [facility].) Arthur Bowen is a 79 y.o. male who presents today for his Complete Annual Exam. He feels fairly well. He reports exercising walking and yard work. He reports he is sleeping fairly well.   Colonoscopy Immunization History  Administered Date(s) Administered  . Fluad Quad(high Dose 65+) 03/20/2019  . Influenza, High Dose Seasonal PF 04/23/2018  . Influenza,inj,Quad PF,6+ Mos 05/26/2015  . Influenza-Unspecified 04/01/2017, 04/23/2018  . PFIZER SARS-COV-2 Vaccination 08/08/2019  . Pneumococcal Conjugate-13 02/19/2014  . Pneumococcal Polysaccharide-23 03/20/2012  . Tdap 03/20/2012    Hypertension This is a chronic problem. The problem is resistant. Associated symptoms include shortness of breath (mild unchanged). Pertinent negatives include no chest pain, headaches or palpitations. Past treatments include ACE inhibitors, calcium channel blockers and direct vasodilators. The current treatment provides significant improvement. Hypertensive end-organ damage includes kidney disease and CAD/MI.  Hyperlipidemia The problem is controlled. Associated symptoms include shortness of breath (mild unchanged). Pertinent negatives include no chest pain. Current antihyperlipidemic treatment includes statins.  Gastroesophageal Reflux He complains of heartburn. He reports no chest pain, no choking, no coughing, no sore throat, no water brash or no wheezing. This is a recurrent problem. Pertinent negatives include no fatigue. He has tried a histamine-2 antagonist for the symptoms.  CKD with anemia and secondary hyperparathyroidism - followed by Nephrology and Hematology.  Getting epogen injections. GFR is stable at 19. CAD - s/p CABG in 2002.  Has been seen by Dr. Ubaldo Glassing in the past.  He denies chest pain and never takes NTG. 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  Lab Results  Component Value Date   CREATININE 3.63 (H) 07/07/2019   BUN 51 (H) 07/07/2019   NA 140 07/07/2019   K 4.2 07/07/2019   CL 113 (H) 07/07/2019   CO2 17 (L) 07/07/2019   Lab Results  Component Value Date   CHOL 124 06/05/2018   HDL 40 06/05/2018   LDLCALC 52 06/05/2018   TRIG 158 (H) 06/05/2018   CHOLHDL 3.1 06/05/2018   Lab Results  Component Value Date   TSH 4.370 06/05/2018   Lab Results  Component Value Date   WBC 13.3 (H) 08/04/2019   HGB 9.8 (L) 08/04/2019   HCT 31.0 (L) 08/04/2019   MCV 93.9 08/04/2019   PLT 275 08/04/2019     Review of Systems  Constitutional: Negative for appetite change, chills, diaphoresis, fatigue, fever and unexpected weight change.  HENT: Negative for hearing loss and sore throat.   Eyes: Negative for visual disturbance.  Respiratory: Positive for shortness of breath (mild unchanged). Negative for cough, choking and wheezing.   Cardiovascular: Negative for chest pain, palpitations and leg swelling.  Gastrointestinal: Positive for heartburn. Negative for anal bleeding, constipation and diarrhea.  Genitourinary: Negative for dysuria and hematuria.  Neurological: Negative for dizziness, light-headedness and headaches.  Hematological: Negative for adenopathy. Bruises/bleeds easily.  Psychiatric/Behavioral: Negative for dysphoric mood and sleep disturbance. The patient is not nervous/anxious.     Patient Active Problem List   Diagnosis Date Noted  . B12 deficiency 08/06/2018  . Anemia due to stage 4 chronic kidney disease ([place]) 07/24/2018  . Bradycardia 06/05/2018  . Symptomatic anemia 11/21/2017  . Secondary hyperparathyroidism of renal origin ([place]) 10/15/2017  . CKD stage 4 secondary to hypertension ([place]) 10/09/2017  .  Primary osteoarthritis of right knee 01/09/2017  . Renal mass 08/14/2015  . BPH with obstruction/lower urinary tract symptoms 08/14/2015  . History of hematuria  08/14/2015  . Coronary artery disease involving native coronary artery of native heart with angina pectoris ([place]) 04/06/2015  . Dyslipidemia 04/06/2015  . Essential (primary) hypertension 04/06/2015  . Neuropathy 04/06/2015  . Peripheral vascular disease ([place]) 04/06/2015  . 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 Senile purpura ([place]) 04/06/2015    Allergies  Allergen Reactions  . Penicillins Other (See Comments)  . Sulfa Antibiotics Other (See Comments)    Past Surgical History:  Procedure Laterality Date  . Circumscision  09/2015  . CORONARY ARTERY BYPASS GRAFT  2002   x3 @ [place]  . INGUINAL HERNIA REPAIR Left 2011  . RENAL BIOPSY  2014   neg in w/u renal mass/hematuria    Social History   Tobacco Use  . Smoking status: Former Smoker    Packs/day: 0.50    Years: 50.00    Pack years: 25.00    Types: Cigarettes    Quit date: 10/10/2017    Years since quitting: 1.8  . Smokeless tobacco: Former Systems developer    Types: Chew  . Tobacco comment: last cig 10/10/17   Substance Use Topics  . Alcohol use: No    Alcohol/week: 0.0 standard drinks  . Drug use: No     Medication list has been reviewed and updated.  Current Meds  Medication Sig  . amLODipine (NORVASC) 10 MG tablet Take 10 mg by mouth daily.  Marland Kitchen aspirin 81 MG tablet Take 1 tablet by mouth daily. Reported on 09/02/2015  . atorvastatin (LIPITOR) 40 MG tablet Take 1 tablet by mouth once daily  . calcitRIOL (ROCALTROL) 0.25 MCG capsule Take 0.25 mcg by mouth daily.  . Cyanocobalamin (B-12) 1000 MCG/ML KIT Inject 1,000 mcg as directed every 30 (thirty) days.  . finasteride (PROSCAR) 5 MG tablet Take 1 tablet by mouth once daily  . hydrALAZINE (APRESOLINE) 50 MG tablet Take 50 mg by mouth 3 (three) times daily.   Marland Kitchen lisinopril (ZESTRIL) 20 MG tablet Take 1 tablet by mouth once daily  . nitroGLYCERIN (NITROSTAT) 0.4 MG SL tablet Place 1 tablet (0.4 mg total) under the tongue every 5 (five) minutes as needed for chest  pain.  . predniSONE (DELTASONE) 10 MG tablet Take 10 mg by mouth daily with breakfast.     PHQ 2/9 Scores 08/19/2019 04/15/2019 06/05/2018 06/05/2018  PHQ - 2 Score 0 0 0 0  PHQ- 9 Score 1 0 - -    BP Readings from Last 3 Encounters:  08/19/19 138/62  08/04/19 (!) 159/71  07/07/19 (!) 180/68    Physical Exam Vitals and nursing note reviewed.  Constitutional:      Appearance: Normal appearance. He is well-developed.  HENT:     Head: Normocephalic.     Right Ear: Tympanic membrane, ear canal and external ear normal.     Left Ear: Tympanic membrane, ear canal and external ear normal.     Nose: Nose normal.     Mouth/Throat:     Pharynx: Uvula midline.  Eyes:     Conjunctiva/sclera: Conjunctivae normal.     Pupils: Pupils are equal, round, and reactive to light.  Neck:     Thyroid: No thyromegaly.     Vascular: No carotid bruit.  Cardiovascular:     Rate and Rhythm: Normal rate and regular rhythm.     Pulses:  Radial pulses are 2+ on the right side and 2+ on the left side.       Dorsalis pedis pulses are 1+ on the right side and 1+ on the left side.     Heart sounds: Normal heart sounds.  Pulmonary:     Effort: Pulmonary effort is normal.     Breath sounds: Normal breath sounds. No decreased breath sounds or wheezing.  Chest:     Breasts:        Right: No mass.        Left: No mass.  Abdominal:     General: Bowel sounds are normal.     Palpations: Abdomen is soft.     Tenderness: There is no abdominal tenderness.  Musculoskeletal:        General: Normal range of motion.     Cervical back: Normal range of motion and neck supple.     Right lower leg: No edema.     Left lower leg: No edema.  Lymphadenopathy:     Cervical: No cervical adenopathy.  Skin:    General: Skin is warm and dry.     Findings: Ecchymosis present. No lesion or rash.  Neurological:     General: No focal deficit present.     Mental Status: He is alert and oriented to person, place, and  time.     Deep Tendon Reflexes: Reflexes are normal and symmetric.  Psychiatric:        Attention and Perception: Attention normal.        Mood and Affect: Mood normal.        Speech: Speech normal.        Behavior: Behavior normal.        Thought Content: Thought content normal.        Judgment: Judgment normal.     Wt Readings from Last 3 Encounters:  08/19/19 200 lb (90.7 kg)  07/07/19 205 lb 0.4 oz (93 kg)  04/15/19 204 lb (92.5 kg)    BP 138/62   Pulse 77   Temp 97.7 F (36.5 C) (Temporal)   Ht 6' (1.829 m)   Wt 200 lb (90.7 kg)   SpO2 97%   BMI 27.12 kg/m   Assessment and Plan: 1. Annual physical exam Normal exam for age Continue healthy diet, physical activity - POCT urinalysis dipstick  2. Essential (primary) hypertension Clinically stable exam with well controlled BP on multiple medications. Tolerating medications without side effects at this time. Pt to continue current regimen and low sodium diet; benefits of regular exercise as able discussed.  3. Dyslipidemia Continue statin therapy, check labs - Lipid panel - Hepatic function panel  4. Coronary artery disease involving native coronary artery of native heart with angina pectoris (HCC) Stable without chest pain; mild SOB with exertion resolves with short rest No PND or orthopnea or edema  5. Secondary hyperparathyroidism of renal origin [facility]) Followed by Nephrology  6. CKD stage 4 secondary to hypertension (HCC) Stable, followed by Nephrology  7. Anemia due to stage 4 chronic kidney disease ([place]) Being monitored by Hematology Treated with Epogen injections and B12 injections  8. BPH with obstruction/lower urinary tract symptoms No urinary issues; no recent Urology visit Check UA, aged out of routine PSA - finasteride (PROSCAR) 5 MG tablet; Take 1 tablet (5 mg total) by mouth daily.  Dispense: 90 tablet; Refill: 3  9. Gastroesophageal reflux disease, unspecified whether esophagitis  present Taking otc medications with relief - will send Rx for pepcid -  famotidine (PEPCID) 20 MG tablet; Take 1 tablet (20 mg total) by mouth daily.  Dispense: 90 tablet; Refill: 3  10. Peripheral vascular disease ([place]) Stable without ulceration or edema Pt denies claudication  11. Senile purpura ([place]) On aspirin therapy as well as prednisone   Partially dictated using Editor, commissioning. Any errors are unintentional.  Halina Maidens, MD [facility]  08/19/2019

## 2019-08-19 NOTE — Patient Instructions (Signed)
Schedule Eye exam 

## 2019-08-20 DIAGNOSIS — D631 Anemia in chronic kidney disease: Secondary | ICD-10-CM | POA: Diagnosis not present

## 2019-08-20 DIAGNOSIS — N2581 Secondary hyperparathyroidism of renal origin: Secondary | ICD-10-CM | POA: Diagnosis not present

## 2019-08-20 DIAGNOSIS — N041 Nephrotic syndrome with focal and segmental glomerular lesions: Secondary | ICD-10-CM | POA: Diagnosis not present

## 2019-08-20 DIAGNOSIS — N184 Chronic kidney disease, stage 4 (severe): Secondary | ICD-10-CM | POA: Diagnosis not present

## 2019-08-20 DIAGNOSIS — I10 Essential (primary) hypertension: Secondary | ICD-10-CM | POA: Diagnosis not present

## 2019-08-20 LAB — HEPATIC FUNCTION PANEL
ALT: 15 IU/L (ref 0–44)
AST: 12 IU/L (ref 0–40)
Albumin: 3.7 g/dL (ref 3.7–4.7)
Alkaline Phosphatase: 90 IU/L (ref 39–117)
Bilirubin Total: 0.3 mg/dL (ref 0.0–1.2)
Bilirubin, Direct: 0.08 mg/dL (ref 0.00–0.40)
Total Protein: 5.3 g/dL — ABNORMAL LOW (ref 6.0–8.5)

## 2019-08-20 LAB — LIPID PANEL
Chol/HDL Ratio: 3.6 ratio (ref 0.0–5.0)
Cholesterol, Total: 142 mg/dL (ref 100–199)
HDL: 39 mg/dL — ABNORMAL LOW (ref >39)
LDL Chol Calc (NIH): 79 mg/dL (ref 0–99)
Triglycerides: 138 mg/dL (ref 0–149)
VLDL Cholesterol Cal: 24 mg/dL (ref 5–40)

## 2019-09-01 ENCOUNTER — Ambulatory Visit: Payer: Medicare Other | Attending: Internal Medicine

## 2019-09-01 ENCOUNTER — Inpatient Hospital Stay: Payer: Medicare Other

## 2019-09-01 DIAGNOSIS — Z23 Encounter for immunization: Secondary | ICD-10-CM

## 2019-09-01 NOTE — Progress Notes (Signed)
   Covid-19 Vaccination Clinic  Name:  ADON GEHLHAUSEN    MRN: [id number] DOB: 15-Apr-1941  09/01/2019  Mr. Czerniak was observed post Covid-19 immunization for 15 minutes without incident. He was provided with Vaccine Information Sheet and instruction to access the V-Safe system.   Mr. Kia was instructed to call 911 with any severe reactions post vaccine: Marland Kitchen Difficulty breathing  . Swelling of face and throat  . A fast heartbeat  . A bad rash all over body  . Dizziness and weakness   Immunizations Administered    Name Date Dose VIS Date Route   Pfizer COVID-19 Vaccine 09/01/2019  9:56 AM 0.3 mL 05/29/2019 Intramuscular   Manufacturer: [employer]   Lot: [id number]   [place]: [id number]

## 2019-09-08 ENCOUNTER — Inpatient Hospital Stay: Payer: Medicare Other

## 2019-09-08 ENCOUNTER — Other Ambulatory Visit: Payer: Self-pay

## 2019-09-08 ENCOUNTER — Inpatient Hospital Stay: Payer: Medicare Other | Attending: Hematology and Oncology

## 2019-09-08 VITALS — BP 157/69 | HR 70 | Temp 97.0°F | Resp 18

## 2019-09-08 DIAGNOSIS — D649 Anemia, unspecified: Secondary | ICD-10-CM

## 2019-09-08 DIAGNOSIS — D631 Anemia in chronic kidney disease: Secondary | ICD-10-CM | POA: Diagnosis not present

## 2019-09-08 DIAGNOSIS — I129 Hypertensive chronic kidney disease with stage 1 through stage 4 chronic kidney disease, or unspecified chronic kidney disease: Secondary | ICD-10-CM

## 2019-09-08 DIAGNOSIS — N184 Chronic kidney disease, stage 4 (severe): Secondary | ICD-10-CM | POA: Diagnosis not present

## 2019-09-08 LAB — CBC WITH DIFFERENTIAL/PLATELET
Abs Immature Granulocytes: 0.06 10*3/uL (ref 0.00–0.07)
Basophils Absolute: 0 10*3/uL (ref 0.0–0.1)
Basophils Relative: 0 %
Eosinophils Absolute: 0.1 10*3/uL (ref 0.0–0.5)
Eosinophils Relative: 1 %
HCT: 29.9 % — ABNORMAL LOW (ref 39.0–52.0)
Hemoglobin: 9.4 g/dL — ABNORMAL LOW (ref 13.0–17.0)
Immature Granulocytes: 1 %
Lymphocytes Relative: 32 %
Lymphs Abs: 3.4 10*3/uL (ref 0.7–4.0)
MCH: 30 pg (ref 26.0–34.0)
MCHC: 31.4 g/dL (ref 30.0–36.0)
MCV: 95.5 fL (ref 80.0–100.0)
Monocytes Absolute: 0.7 10*3/uL (ref 0.1–1.0)
Monocytes Relative: 6 %
Neutro Abs: 6.4 10*3/uL (ref 1.7–7.7)
Neutrophils Relative %: 60 %
Platelets: 281 10*3/uL (ref 150–400)
RBC: 3.13 MIL/uL — ABNORMAL LOW (ref 4.22–5.81)
RDW: 15.8 % — ABNORMAL HIGH (ref 11.5–15.5)
WBC: 10.6 10*3/uL — ABNORMAL HIGH (ref 4.0–10.5)
nRBC: 0 % (ref 0.0–0.2)

## 2019-09-08 MED ORDER — EPOETIN ALFA-EPBX 40000 UNIT/ML IJ SOLN
40000.0000 [IU] | Freq: Once | INTRAMUSCULAR | Status: AC
  Administered 2019-09-08: 40000 [IU] via SUBCUTANEOUS

## 2019-09-08 NOTE — Patient Instructions (Signed)

## 2019-09-14 DIAGNOSIS — N184 Chronic kidney disease, stage 4 (severe): Secondary | ICD-10-CM | POA: Diagnosis not present

## 2019-09-14 DIAGNOSIS — N2581 Secondary hyperparathyroidism of renal origin: Secondary | ICD-10-CM | POA: Diagnosis not present

## 2019-09-21 DIAGNOSIS — I10 Essential (primary) hypertension: Secondary | ICD-10-CM | POA: Diagnosis not present

## 2019-09-21 DIAGNOSIS — D631 Anemia in chronic kidney disease: Secondary | ICD-10-CM | POA: Diagnosis not present

## 2019-09-21 DIAGNOSIS — N041 Nephrotic syndrome with focal and segmental glomerular lesions: Secondary | ICD-10-CM | POA: Diagnosis not present

## 2019-09-21 DIAGNOSIS — N2581 Secondary hyperparathyroidism of renal origin: Secondary | ICD-10-CM | POA: Diagnosis not present

## 2019-09-21 DIAGNOSIS — N184 Chronic kidney disease, stage 4 (severe): Secondary | ICD-10-CM | POA: Diagnosis not present

## 2019-09-24 NOTE — Progress Notes (Signed)
[facility]  [address] [place], [place] [address] Phone: [phone]  Fax: [phone]   Clinic Day:  09/28/2019  Referring physician: Glean Hess, MD  Chief Complaint: Arthur Bowen is a 79 y.o. male with anemia of chronic kidney disease and B12 deficiency who is seen for 3 month assessment.   HPI: The patient was last seen in the hematology clinic on 07/07/2019. At that time, he felt "fair".  Blood pressure was 180/68 today. Hematocrit 35.2, hemoglobin 10.9, platelets 348,000, WBC 11,300 (ANC 6,600). Ferritin was 72. Creatinine was 3.63. He did not receive Retacrit secondary to hypertension.   Patient was seen by Dr. Holley Raring on 08/20/2019. Patient was trialed on both CellCept and prednisone, neither of which he tolerated. Patient continued lisinopril. Patient's renal dysfunction was likely to lead to ESRD. Patient favored hemodialysis. Patient was referred to vascular surgery for placement of a fistula.   He received Retacrit on 08/04/2019 and 09/08/2019.   He was seen by Dr. Holley Raring on 09/21/2019. Patient noted some shortness of breath, chest pain and fatigue. Dr. Holley Raring discussed initiation of hemodialysis. Patient had progressive CKD.  Creatinine was 3.56.  GFR was down to 15 ml/min with a urine protein creatinine ratio of 4.9. He remained on lisinopril. Family and patient decided upon hemodialysis. Patient was prescribed calcitriol 0.25 mcg per day. Patient was referred to [facility] for hemodialysis access placement. Follow up planned for 10/19/2019.    Labs on followed: 08/04/2019: Hematocrit 31.0, hemoglobin 9.8, platelets 275,000, WBC 13,300 (ANC 8,800).  09/08/2019: Hematocrit 29.9, hemoglobin 9.4, platelets 281,000, WBC 10,600 (ANC 6,400).   During the interim, he has felt "ok". He notes his eyesight is getting bad secondary to his poor kidney function. He will be seen by [facility] for hemodialysis access  placement on 10/05/2019.  His weight is down 9 pounds. He reports that he cut back on eating. He has a good appetite. He is active. He is on oral B12. He had chest pain that resolved with nitroglycerin. His reflux is controlled with medication. He has bilateral knee pain.    Past Medical History:  Diagnosis Date  . Acquired phimosis   . BPH (benign prostatic hypertrophy)   . Chronic kidney disease   . Dyspnea    with exertion  . Elevated PSA   . Gross hematuria   . Hematuria   . History of hiatal hernia   . Hypertension   . 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 Renal mass, left   . Symptomatic anemia 11/21/2017    Past Surgical History:  Procedure Laterality Date  . Circumscision  09/2015  . CORONARY ARTERY BYPASS GRAFT  2002   x3 @ [place]  . INGUINAL HERNIA REPAIR Left 2011  . RENAL BIOPSY  2014   neg in w/u renal mass/hematuria    Family History  Problem Relation Age of Onset  . Lymphoma Mother   . Hodgkin's lymphoma Mother   . Lung cancer Father   . Heart disease Brother   . Dementia Sister   . Dementia Brother   . Breast cancer Maternal Aunt     Social History:  reports that he quit smoking about 1 years ago. His smoking use included cigarettes. He has a 25.00 pack-year smoking history. He has quit using smokeless tobacco.  His smokeless tobacco use included chew. He reports that he does not drink alcohol or use drugs. His granddaughter is a  EMT and she is going to school to be a Marine scientist. He notes she will finish nursing school in x 2 years.He lives in [place]. The patient is alone today.  Allergies:  Allergies  Allergen Reactions  . Penicillins Other (See Comments)  . Sulfa Antibiotics Other (See Comments)    Current Medications: Current Outpatient Medications  Medication Sig Dispense Refill  . amLODipine (NORVASC) 10 MG tablet Take 10 mg by mouth daily.    Marland Kitchen aspirin 81 MG tablet Take 1 tablet by mouth daily. Reported on 09/02/2015    . atorvastatin  (LIPITOR) 40 MG tablet Take 1 tablet by mouth once daily 90 tablet 3  . calcitRIOL (ROCALTROL) 0.25 MCG capsule Take 0.25 mcg by mouth daily.    . Cyanocobalamin (B-12) 1000 MCG/ML KIT Inject 1,000 mcg as directed every 30 (thirty) days. 1 kit 6  . famotidine (PEPCID) 20 MG tablet Take 1 tablet (20 mg total) by mouth daily. 90 tablet 3  . finasteride (PROSCAR) 5 MG tablet Take 1 tablet (5 mg total) by mouth daily. 90 tablet 3  . hydrALAZINE (APRESOLINE) 25 MG tablet Take 25 mg by mouth 3 (three) times daily.    . hydrALAZINE (APRESOLINE) 50 MG tablet Take 50 mg by mouth 3 (three) times daily.     Marland Kitchen lisinopril (ZESTRIL) 20 MG tablet Take 1 tablet by mouth once daily 90 tablet 3  . predniSONE (DELTASONE) 10 MG tablet Take 10 mg by mouth daily with breakfast.     . nitroGLYCERIN (NITROSTAT) 0.4 MG SL tablet Place 1 tablet (0.4 mg total) under the tongue every 5 (five) minutes as needed for chest pain. (Patient not taking: Reported on 09/28/2019) 50 tablet 3   No current facility-administered medications for this visit.    Review of Systems  Constitutional: Positive for weight loss (9 lbs). Negative for chills, diaphoresis, fever and malaise/fatigue.       Feels "ok".  HENT: Negative.  Negative for congestion, ear discharge, ear pain, hearing loss, nosebleeds, sinus pain and sore throat.   Eyes: Positive for blurred vision (occasional). Negative for double vision, photophobia and pain.       Eye sight worsening secondary to poor kidney function.  Respiratory: Negative.  Negative for cough, hemoptysis, sputum production, shortness of breath and wheezing.   Cardiovascular: Negative.  Negative for chest pain, palpitations, orthopnea, leg swelling and PND.  Gastrointestinal: Positive for heartburn (controlled with medication). Negative for abdominal pain, blood in stool, constipation, diarrhea, melena, nausea and vomiting.       Good appetite. Eating better.  Genitourinary: Negative for dysuria,  frequency, hematuria and urgency.       Nocturia.  Musculoskeletal: Positive for joint pain (bilateral knees). Negative for back pain, falls and myalgias.  Skin: Negative.  Negative for rash.  Neurological: Negative.  Negative for dizziness, tremors, speech change, focal weakness, seizures, weakness and headaches.  Endo/Heme/Allergies: Negative.  Does not bruise/bleed easily.  Psychiatric/Behavioral: Negative.  Negative for depression, hallucinations, memory loss and suicidal ideas. The patient is not nervous/anxious and does not have insomnia.   All other systems reviewed and are negative.  Performance status (ECOG): 1  Vitals Blood pressure (!) 155/60, pulse 80, temperature 97.7 F (36.5 C), temperature source Oral, resp. rate 18, weight 196 lb 13.9 oz (89.3 kg), SpO2 100 %.   Physical Exam  Constitutional: He is oriented to person, place, and time. He appears well-developed and well-nourished. No distress.  HENT:  Head: Normocephalic and atraumatic.  Mouth/Throat: Oropharynx is  clear and moist. No oropharyngeal exudate.  Short gray hair. Male pattern baldness.  Mask.  Eyes: Pupils are equal, round, and reactive to light. Conjunctivae and EOM are normal. No scleral icterus.  Glasses.  Neck: No JVD present.  Cardiovascular: Normal rate, regular rhythm and normal heart sounds.  No murmur heard. Pulmonary/Chest: Effort normal and breath sounds normal. No respiratory distress. He has no wheezes. He has no rales.  Abdominal: Soft. Bowel sounds are normal. He exhibits no distension and no mass. There is no hepatosplenomegaly. There is no abdominal tenderness. There is no rebound and no guarding.  Musculoskeletal:        General: No edema. Normal range of motion.     Cervical back: Normal range of motion and neck supple.  Lymphadenopathy:       Head (right side): No preauricular, no posterior auricular and no occipital adenopathy present.       Head (left side): No preauricular, no  posterior auricular and no occipital adenopathy present.    He has no cervical adenopathy.    He has no axillary adenopathy.       Right: No supraclavicular adenopathy present.       Left: No supraclavicular adenopathy present.  Neurological: He is alert and oriented to person, place, and time.  Skin: Skin is warm and dry. No rash noted. He is not diaphoretic. No erythema. No pallor.  Psychiatric: He has a normal mood and affect. His behavior is normal. Judgment and thought content normal.  Nursing note and vitals reviewed.   Appointment on 09/28/2019  Component Date Value Ref Range Status  . WBC 09/28/2019 10.7* 4.0 - 10.5 K/uL Final  . RBC 09/28/2019 3.34* 4.22 - 5.81 MIL/uL Final  . Hemoglobin 09/28/2019 9.8* 13.0 - 17.0 g/dL Final  . HCT 09/28/2019 31.6* 39.0 - 52.0 % Final  . MCV 09/28/2019 94.6  80.0 - 100.0 fL Final  . MCH 09/28/2019 29.3  26.0 - 34.0 pg Final  . MCHC 09/28/2019 31.0  30.0 - 36.0 g/dL Final  . RDW 09/28/2019 16.1* 11.5 - 15.5 % Final  . Platelets 09/28/2019 315  150 - 400 K/uL Final  . nRBC 09/28/2019 0.0  0.0 - 0.2 % Final  . Neutrophils Relative % 09/28/2019 68  % Final  . Neutro Abs 09/28/2019 7.3  1.7 - 7.7 K/uL Final  . Lymphocytes Relative 09/28/2019 25  % Final  . Lymphs Abs 09/28/2019 2.6  0.7 - 4.0 K/uL Final  . Monocytes Relative 09/28/2019 5  % Final  . Monocytes Absolute 09/28/2019 0.6  0.1 - 1.0 K/uL Final  . Eosinophils Relative 09/28/2019 1  % Final  . Eosinophils Absolute 09/28/2019 0.1  0.0 - 0.5 K/uL Final  . Basophils Relative 09/28/2019 0  % Final  . Basophils Absolute 09/28/2019 0.0  0.0 - 0.1 K/uL Final  . Immature Granulocytes 09/28/2019 1  % Final  . Abs Immature Granulocytes 09/28/2019 0.07  0.00 - 0.07 K/uL Final   Performed at [facility], [address], [place], [place] [address]  . Sodium 09/28/2019 141  135 - 145 mmol/L Final  . Potassium 09/28/2019 4.7  3.5 - 5.1 mmol/L Final  . Chloride 09/28/2019 113* 98 -  111 mmol/L Final  . CO2 09/28/2019 20* 22 - 32 mmol/L Final  . Glucose, Bld 09/28/2019 97  70 - 99 mg/dL Final   Glucose reference range applies only to samples taken after fasting for at least 8 hours.  . BUN 09/28/2019  56* 8 - 23 mg/dL Final  . Creatinine, Ser 09/28/2019 3.52* 0.61 - 1.24 mg/dL Final  . Calcium 09/28/2019 9.2  8.9 - 10.3 mg/dL Final  . GFR calc non Af Amer 09/28/2019 15* >60 mL/min Final  . GFR calc Af Amer 09/28/2019 18* >60 mL/min Final  . Anion gap 09/28/2019 8  5 - 15 Final   Performed at [facility], [address], [place], [place] [address]    Assessment:  Arthur Bowen is a 79 y.o. male withanemia of chronic renal disease and B12 deficiency. He has focal segmental glomerulosclerosis (FSGS). Due to worsening kidney function, Cellcept wasdiscontinued.   He received Procrit50,000 units monthly through 02/20/2018. He beganRetacriton 08/08/2018 (50,000 units). In 03/020, hemoglobin remained low and frequency was increased to every 14 days.  He has received Retacrit 40,000 units every 4 weeks since 11/25/2018 (last 06/09/2019).   Hemoglobinhas been followed. 9.0 on 08/14/2018,7.8on 09/05/2018),8.3on 09/19/2018, 9.6 on 11/06/2018, 10.3 on 12/23/2018, 9.9 on 01/20/2019, 10.1 on 02/17/2019, 9.1 on 03/17/2019, 9.9 on 04/14/2019, 10.0 on 05/12/2019, 10.7 on 06/09/2019, and 10.9 on 07/07/2019.  He has B12 deficiencyand continues monthly home b12 injections. Last B12 level was629on02/20/2020. Intrinsic factor antibody and antiparietal antibodywerenegative on 08/06/2018.   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 Margins were not clear at that time and was reexcised by dermatology on04/16/2016. He isfollowed bydermatology.  He has a left renal masss/pCT-guided biopsy on 09/15/2012. Pathologywas negative. Ultrasound of kidneys on 11/18/2017 revealed a stable  5.1 cm left kidney mass. He is followed by Dr. Erlene Quan.  Abdomen and pelvis CTon 10/10/2018 revealed cholelithiasis without inflammation.There was no hydronephrosis,renal obstruction,renal or ureteral calculi.There was a stable 4.8 cm complex and peripherally calcified exophytic lesion arising from midpole of left kidney mostc/wbenign complex or proteinaceous cyst. There was mild prostatic enlargement.  [facility] for symptomatic bradycardiathought to be due to beta-blocker and symptomatic anemia. Hemoglobin was 6.4 and he received 2 units of PRBCs. Work-up revealed ferritin 247, iron saturation 33%, folate normal. B12 was 311 (previously 6177 on 10/24/2017)and he began B12injections.  He was again admitted [facility] on02/19/2020 -02/20/2020for anemia. Hemoglobin was 5.8. Ferritin was 350. B12was 629. Folate was 7.5. Received 3 units of PRBCs.  Symptomatically, he feels "ok".  Weight is down 9 pounds.  He denies any bleeding.  Exam is stable.  Plan: 1.   Labs today: CBC with diff, ferritin, iron studies, folate. 2.Anemia of chronic kidney disease Hematocrit 31.6. Hemoglobin9.8. MCV94.6.  Ferritin 60.   Hemoglobingoal is>=10 (Retacrit if Hgb < 11). Retacrit today. 3.Stage III chronic kidney disease BUN46and creatinine2.30on 01/20/2019. BUN 56 and creatinine 3.52 on 09/28/2019. Patient followed by Dr. Holley Raring. 4.B12 deficiency Patient receivesmonthly B12 at home. Folate 12.4 today. Check folate annually. 5.RTC every 4 weeks x 3 for HCT/hemoglobin and +/- Retacrit  6.   RTC in 3 months for MD assessment, labs (CBC with diff, ferritin, iron studies) and +/- Retacrit .   I discussed the assessment and treatment plan with the patient.  The patient was provided an opportunity to ask questions and all were answered.  The patient agreed with the plan and demonstrated an understanding of the  instructions.  The patient was advised to call back if the symptoms worsen or if the condition fails to improve as anticipated.   Lequita Asal, MD, PhD    09/28/2019, 10:49 AM  I, Selena Batten, am acting as scribe  for Kayley Zeiders C. Mike Gip, MD, PhD.  I, Sharnese Heath C. Mike Gip, MD, have reviewed the above documentation for accuracy and completeness, and I agree with the above.

## 2019-09-28 ENCOUNTER — Inpatient Hospital Stay: Payer: Medicare Other | Attending: Hematology and Oncology

## 2019-09-28 ENCOUNTER — Inpatient Hospital Stay: Payer: Medicare Other

## 2019-09-28 ENCOUNTER — Other Ambulatory Visit: Payer: Self-pay | Admitting: Hematology and Oncology

## 2019-09-28 ENCOUNTER — Other Ambulatory Visit: Payer: Self-pay

## 2019-09-28 ENCOUNTER — Encounter: Payer: Self-pay | Admitting: Hematology and Oncology

## 2019-09-28 ENCOUNTER — Inpatient Hospital Stay (HOSPITAL_BASED_OUTPATIENT_CLINIC_OR_DEPARTMENT_OTHER): Payer: Medicare Other | Admitting: Hematology and Oncology

## 2019-09-28 VITALS — BP 155/60 | HR 80 | Temp 97.7°F | Resp 18 | Wt 196.9 lb

## 2019-09-28 DIAGNOSIS — E538 Deficiency of other specified B group vitamins: Secondary | ICD-10-CM | POA: Diagnosis not present

## 2019-09-28 DIAGNOSIS — D631 Anemia in chronic kidney disease: Secondary | ICD-10-CM

## 2019-09-28 DIAGNOSIS — D649 Anemia, unspecified: Secondary | ICD-10-CM

## 2019-09-28 DIAGNOSIS — N184 Chronic kidney disease, stage 4 (severe): Secondary | ICD-10-CM

## 2019-09-28 DIAGNOSIS — D692 Other nonthrombocytopenic purpura: Secondary | ICD-10-CM

## 2019-09-28 DIAGNOSIS — I129 Hypertensive chronic kidney disease with stage 1 through stage 4 chronic kidney disease, or unspecified chronic kidney disease: Secondary | ICD-10-CM

## 2019-09-28 LAB — BASIC METABOLIC PANEL
Anion gap: 8 (ref 5–15)
BUN: 56 mg/dL — ABNORMAL HIGH (ref 8–23)
CO2: 20 mmol/L — ABNORMAL LOW (ref 22–32)
Calcium: 9.2 mg/dL (ref 8.9–10.3)
Chloride: 113 mmol/L — ABNORMAL HIGH (ref 98–111)
Creatinine, Ser: 3.52 mg/dL — ABNORMAL HIGH (ref 0.61–1.24)
GFR calc Af Amer: 18 mL/min — ABNORMAL LOW (ref >60)
GFR calc non Af Amer: 15 mL/min — ABNORMAL LOW (ref >60)
Glucose, Bld: 97 mg/dL (ref 70–99)
Potassium: 4.7 mmol/L (ref 3.5–5.1)
Sodium: 141 mmol/L (ref 135–145)

## 2019-09-28 LAB — FOLATE: Folate: 12.4 ng/mL (ref >5.9)

## 2019-09-28 LAB — CBC WITH DIFFERENTIAL/PLATELET
Abs Immature Granulocytes: 0.07 10*3/uL (ref 0.00–0.07)
Basophils Absolute: 0 10*3/uL (ref 0.0–0.1)
Basophils Relative: 0 %
Eosinophils Absolute: 0.1 10*3/uL (ref 0.0–0.5)
Eosinophils Relative: 1 %
HCT: 31.6 % — ABNORMAL LOW (ref 39.0–52.0)
Hemoglobin: 9.8 g/dL — ABNORMAL LOW (ref 13.0–17.0)
Immature Granulocytes: 1 %
Lymphocytes Relative: 25 %
Lymphs Abs: 2.6 10*3/uL (ref 0.7–4.0)
MCH: 29.3 pg (ref 26.0–34.0)
MCHC: 31 g/dL (ref 30.0–36.0)
MCV: 94.6 fL (ref 80.0–100.0)
Monocytes Absolute: 0.6 10*3/uL (ref 0.1–1.0)
Monocytes Relative: 5 %
Neutro Abs: 7.3 10*3/uL (ref 1.7–7.7)
Neutrophils Relative %: 68 %
Platelets: 315 10*3/uL (ref 150–400)
RBC: 3.34 MIL/uL — ABNORMAL LOW (ref 4.22–5.81)
RDW: 16.1 % — ABNORMAL HIGH (ref 11.5–15.5)
WBC: 10.7 10*3/uL — ABNORMAL HIGH (ref 4.0–10.5)
nRBC: 0 % (ref 0.0–0.2)

## 2019-09-28 LAB — IRON AND TIBC
Iron: 86 ug/dL (ref 45–182)
Saturation Ratios: 29 % (ref 17.9–39.5)
TIBC: 293 ug/dL (ref 250–450)
UIBC: 207 ug/dL

## 2019-09-28 LAB — FERRITIN: Ferritin: 60 ng/mL (ref 24–336)

## 2019-09-28 MED ORDER — EPOETIN ALFA-EPBX 40000 UNIT/ML IJ SOLN
40000.0000 [IU] | Freq: Once | INTRAMUSCULAR | Status: AC
  Administered 2019-09-28: 40000 [IU] via SUBCUTANEOUS

## 2019-09-28 NOTE — Progress Notes (Signed)
Patient here for follow up. Denies any concerns.  

## 2019-09-28 NOTE — Patient Instructions (Signed)

## 2019-09-29 ENCOUNTER — Inpatient Hospital Stay: Payer: Medicare Other

## 2019-09-29 ENCOUNTER — Ambulatory Visit: Payer: Medicare Other | Admitting: Hematology and Oncology

## 2019-10-05 ENCOUNTER — Other Ambulatory Visit (INDEPENDENT_AMBULATORY_CARE_PROVIDER_SITE_OTHER): Payer: Medicare Other

## 2019-10-05 ENCOUNTER — Encounter (INDEPENDENT_AMBULATORY_CARE_PROVIDER_SITE_OTHER): Payer: Self-pay | Admitting: Vascular Surgery

## 2019-10-05 ENCOUNTER — Encounter (INDEPENDENT_AMBULATORY_CARE_PROVIDER_SITE_OTHER): Payer: Medicare Other

## 2019-10-05 DIAGNOSIS — E875 Hyperkalemia: Secondary | ICD-10-CM | POA: Diagnosis not present

## 2019-10-05 DIAGNOSIS — N184 Chronic kidney disease, stage 4 (severe): Secondary | ICD-10-CM | POA: Diagnosis not present

## 2019-10-13 ENCOUNTER — Other Ambulatory Visit (INDEPENDENT_AMBULATORY_CARE_PROVIDER_SITE_OTHER): Payer: Self-pay | Admitting: Vascular Surgery

## 2019-10-13 DIAGNOSIS — N189 Chronic kidney disease, unspecified: Secondary | ICD-10-CM

## 2019-10-15 ENCOUNTER — Ambulatory Visit (INDEPENDENT_AMBULATORY_CARE_PROVIDER_SITE_OTHER): Payer: Medicare Other

## 2019-10-15 ENCOUNTER — Other Ambulatory Visit: Payer: Self-pay

## 2019-10-15 ENCOUNTER — Ambulatory Visit (INDEPENDENT_AMBULATORY_CARE_PROVIDER_SITE_OTHER): Payer: Medicare Other | Admitting: Nurse Practitioner

## 2019-10-15 ENCOUNTER — Encounter (INDEPENDENT_AMBULATORY_CARE_PROVIDER_SITE_OTHER): Payer: Self-pay | Admitting: Vascular Surgery

## 2019-10-15 VITALS — BP 147/62 | HR 99 | Resp 16 | Ht 72.0 in | Wt 192.4 lb

## 2019-10-15 DIAGNOSIS — I10 Essential (primary) hypertension: Secondary | ICD-10-CM

## 2019-10-15 DIAGNOSIS — N184 Chronic kidney disease, stage 4 (severe): Secondary | ICD-10-CM

## 2019-10-15 DIAGNOSIS — I129 Hypertensive chronic kidney disease with stage 1 through stage 4 chronic kidney disease, or unspecified chronic kidney disease: Secondary | ICD-10-CM

## 2019-10-15 DIAGNOSIS — E785 Hyperlipidemia, unspecified: Secondary | ICD-10-CM | POA: Diagnosis not present

## 2019-10-15 DIAGNOSIS — N189 Chronic kidney disease, unspecified: Secondary | ICD-10-CM | POA: Diagnosis not present

## 2019-10-15 DIAGNOSIS — I25119 Atherosclerotic heart disease of native coronary artery with unspecified angina pectoris: Secondary | ICD-10-CM | POA: Diagnosis not present

## 2019-10-15 DIAGNOSIS — I739 Peripheral vascular disease, unspecified: Secondary | ICD-10-CM

## 2019-10-15 NOTE — Progress Notes (Signed)
Subjective:    Patient ID: Arthur Bowen, male    DOB: 05-Aug-1940, 79 y.o.   MRN: [id number] Chief Complaint  Patient presents with  . New Patient (Initial Visit)    ref [place] vein mapping    The patient is seen for evaluation for dialysis access. The patient has chronic renal insufficiency stage V secondary to hypertension and diabetes. The patient's most recent creatinine clearance is less than 20. The patient volume status has not yet become an issue. Patient's blood pressures been relatively well controlled. There are mild uremic symptoms which appear to be relatively well tolerated at this time. The patient is right-handed.  The patient has been considering the various methods of dialysis and wishes to proceed with hemodialysis and therefore creation of AV access.  The patient denies amaurosis fugax or recent TIA symptoms. There are no recent neurological changes noted. The patient denies claudication symptoms or rest pain symptoms. The patient denies history of DVT, PE or superficial thrombophlebitis. The patient denies recent episodes of angina or shortness of breath.   Today noninvasive studies show adequate vein size for creation of a left brachiocephalic AV fistula   Review of Systems  Musculoskeletal: Positive for arthralgias.  Neurological: Positive for numbness.  All other systems reviewed and are negative.      Objective:   Physical Exam Vitals reviewed. Exam conducted with a chaperone present (Daughter present).  Cardiovascular:     Rate and Rhythm: Normal rate and regular rhythm.     Pulses: Normal pulses.     Heart sounds: Normal heart sounds.  Pulmonary:     Effort: Pulmonary effort is normal.     Breath sounds: Normal breath sounds.  Skin:    Capillary Refill: Capillary refill takes less than 2 seconds.  Neurological:     Mental Status: He is alert and oriented to person, place, and time.  Psychiatric:        Mood and Affect: Mood normal.      Behavior: Behavior normal.        Thought Content: Thought content normal.        Judgment: Judgment normal.     BP (!) 147/62 (BP Location: Left Arm)   Pulse 99   Resp 16   Ht 6' (1.829 m)   Wt 192 lb 6.4 oz (87.3 kg)   BMI 26.09 kg/m   Past Medical History:  Diagnosis Date  . Acquired phimosis   . BPH (benign prostatic hypertrophy)   . Chronic kidney disease   . Dyspnea    with exertion  . Elevated PSA   . Gross hematuria   . Hematuria   . History of hiatal hernia   . Hypertension   . 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 Renal mass, left   . Symptomatic anemia 11/21/2017    Social History   Socioeconomic History  . Marital status: Divorced    Spouse name: Not on file  . Number of children: 2  . Years of education: Not on file  . Highest education level: Not on file  Occupational History  . Not on file  Tobacco Use  . Smoking status: Former Smoker    Packs/day: 0.50    Years: 50.00    Pack years: 25.00    Types: Cigarettes    Quit date: 10/10/2017    Years since quitting: 2.0  . Smokeless tobacco: Former Systems developer    Types: Chew  . Tobacco comment: last cig  10/10/17   Substance and Sexual Activity  . Alcohol use: No    Alcohol/week: 0.0 standard drinks  . Drug use: No  . Sexual activity: Not Currently  Other Topics Concern  . Not on file  Social History Narrative  . Not on file   Social Determinants of Health   Financial Resource Strain:   . Difficulty of Paying Living Expenses:   Food Insecurity:   . Worried About Charity fundraiser in the Last Year:   . Arboriculturist in the Last Year:   Transportation Needs:   . Film/video editor (Medical):   Marland Kitchen Lack of Transportation (Non-Medical):   Physical Activity:   . Days of Exercise per Week:   . Minutes of Exercise per Session:   Stress:   . Feeling of Stress :   Social Connections:   . Frequency of Communication with Friends and Family:   . Frequency of Social Gatherings with Friends  and Family:   . Attends Religious Services:   . Active Member of Clubs or Organizations:   . Attends Archivist Meetings:   Marland Kitchen Marital Status:   Intimate Partner Violence:   . Fear of Current or Ex-Partner:   . Emotionally Abused:   Marland Kitchen Physically Abused:   . Sexually Abused:     Past Surgical History:  Procedure Laterality Date  . Circumscision  09/2015  . CORONARY ARTERY BYPASS GRAFT  2002   x3 @ [place]  . INGUINAL HERNIA REPAIR Left 2011  . RENAL BIOPSY  2014   neg in w/u renal mass/hematuria    Family History  Problem Relation Age of Onset  . Lymphoma Mother   . Hodgkin's lymphoma Mother   . Lung cancer Father   . Heart disease Brother   . Dementia Sister   . Dementia Brother   . Breast cancer Maternal Aunt     Allergies  Allergen Reactions  . Penicillins Other (See Comments)  . Sulfa Antibiotics Other (See Comments)       Assessment & Plan:   1. CKD stage 4 secondary to hypertension ([place]) Recommend:  At this time the patient does not have appropriate extremity access for dialysis  Patient should have a left brachiocephalic created.  In the instance that the cephalic vein is found to be sclerotic or possibly smaller than what was estimated on ultrasound, a graft may be utilized instead.  The risks, benefits and alternative therapies were reviewed in detail with the patient.  All questions were answered.  The patient agrees to proceed with surgery.    2. Essential (primary) hypertension Continue antihypertensive medications as already ordered, these medications have been reviewed and there are no changes at this time.   3. Coronary artery disease involving native coronary artery of native heart with angina pectoris ([place]) Continue cardiac and antihypertensive medications as already ordered and reviewed, no changes at this time.  Continue statin as ordered and reviewed, no changes at this time  Nitrates PRN for chest pain   4.  Dyslipidemia Continue statin as ordered and reviewed, no changes at this time    Current Outpatient Medications on File Prior to Visit  Medication Sig Dispense Refill  . amLODipine (NORVASC) 10 MG tablet Take 10 mg by mouth daily.    Marland Kitchen aspirin 81 MG tablet Take 1 tablet by mouth daily. Reported on 09/02/2015    . atorvastatin (LIPITOR) 40 MG tablet Take 1 tablet by mouth once daily 90 tablet 3  .  calcitRIOL (ROCALTROL) 0.25 MCG capsule Take 0.25 mcg by mouth daily.    . Cyanocobalamin (B-12) 1000 MCG/ML KIT Inject 1,000 mcg as directed every 30 (thirty) days. 1 kit 6  . famotidine (PEPCID) 20 MG tablet Take 1 tablet (20 mg total) by mouth daily. 90 tablet 3  . finasteride (PROSCAR) 5 MG tablet Take 1 tablet (5 mg total) by mouth daily. 90 tablet 3  . hydrALAZINE (APRESOLINE) 25 MG tablet Take 25 mg by mouth 3 (three) times daily.    . hydrALAZINE (APRESOLINE) 50 MG tablet Take 50 mg by mouth 3 (three) times daily.     Marland Kitchen lisinopril (ZESTRIL) 20 MG tablet Take 1 tablet by mouth once daily 90 tablet 3  . patiromer (VELTASSA) 8.4 g packet Take 8.4 g by mouth daily.    . predniSONE (DELTASONE) 10 MG tablet Take 10 mg by mouth daily with breakfast.     . nitroGLYCERIN (NITROSTAT) 0.4 MG SL tablet Place 1 tablet (0.4 mg total) under the tongue every 5 (five) minutes as needed for chest pain. (Patient not taking: Reported on 09/28/2019) 50 tablet 3   No current facility-administered medications on file prior to visit.    There are no Patient Instructions on file for this visit. No follow-ups on file.   Kris Hartmann, NP

## 2019-10-19 ENCOUNTER — Encounter (INDEPENDENT_AMBULATORY_CARE_PROVIDER_SITE_OTHER): Payer: Self-pay | Admitting: Nurse Practitioner

## 2019-10-19 DIAGNOSIS — N184 Chronic kidney disease, stage 4 (severe): Secondary | ICD-10-CM | POA: Diagnosis not present

## 2019-10-19 DIAGNOSIS — I10 Essential (primary) hypertension: Secondary | ICD-10-CM | POA: Diagnosis not present

## 2019-10-19 DIAGNOSIS — D631 Anemia in chronic kidney disease: Secondary | ICD-10-CM | POA: Diagnosis not present

## 2019-10-19 DIAGNOSIS — N041 Nephrotic syndrome with focal and segmental glomerular lesions: Secondary | ICD-10-CM | POA: Diagnosis not present

## 2019-10-19 DIAGNOSIS — N2581 Secondary hyperparathyroidism of renal origin: Secondary | ICD-10-CM | POA: Diagnosis not present

## 2019-10-26 ENCOUNTER — Other Ambulatory Visit: Payer: Self-pay

## 2019-10-26 ENCOUNTER — Telehealth (INDEPENDENT_AMBULATORY_CARE_PROVIDER_SITE_OTHER): Payer: Self-pay

## 2019-10-26 ENCOUNTER — Inpatient Hospital Stay: Payer: Medicare Other | Attending: Hematology and Oncology

## 2019-10-26 ENCOUNTER — Inpatient Hospital Stay: Payer: Medicare Other

## 2019-10-26 VITALS — BP 153/72 | HR 77 | Temp 96.7°F | Resp 18

## 2019-10-26 DIAGNOSIS — D631 Anemia in chronic kidney disease: Secondary | ICD-10-CM | POA: Insufficient documentation

## 2019-10-26 DIAGNOSIS — N184 Chronic kidney disease, stage 4 (severe): Secondary | ICD-10-CM | POA: Diagnosis not present

## 2019-10-26 DIAGNOSIS — I129 Hypertensive chronic kidney disease with stage 1 through stage 4 chronic kidney disease, or unspecified chronic kidney disease: Secondary | ICD-10-CM | POA: Diagnosis not present

## 2019-10-26 DIAGNOSIS — D692 Other nonthrombocytopenic purpura: Secondary | ICD-10-CM | POA: Diagnosis not present

## 2019-10-26 DIAGNOSIS — E538 Deficiency of other specified B group vitamins: Secondary | ICD-10-CM

## 2019-10-26 DIAGNOSIS — D649 Anemia, unspecified: Secondary | ICD-10-CM

## 2019-10-26 DIAGNOSIS — I739 Peripheral vascular disease, unspecified: Secondary | ICD-10-CM | POA: Diagnosis not present

## 2019-10-26 DIAGNOSIS — I25119 Atherosclerotic heart disease of native coronary artery with unspecified angina pectoris: Secondary | ICD-10-CM | POA: Diagnosis not present

## 2019-10-26 DIAGNOSIS — I10 Essential (primary) hypertension: Secondary | ICD-10-CM | POA: Diagnosis not present

## 2019-10-26 LAB — HEMOGLOBIN AND HEMATOCRIT, BLOOD
HCT: 33.2 % — ABNORMAL LOW (ref 39.0–52.0)
Hemoglobin: 10.4 g/dL — ABNORMAL LOW (ref 13.0–17.0)

## 2019-10-26 MED ORDER — EPOETIN ALFA-EPBX 40000 UNIT/ML IJ SOLN
40000.0000 [IU] | Freq: Once | INTRAMUSCULAR | Status: AC
  Administered 2019-10-26: 40000 [IU] via SUBCUTANEOUS

## 2019-10-26 NOTE — Telephone Encounter (Signed)
Dr. Lance Sell him today in the office. He is stable from cardiac standpoint. Reasonably active without cp. functional study 1 year ago normal. Normal ef. Moderate risk for surgery but well optimized. No further work up indicates.  Dr. Holley Raring Thanks for letting me know  Dr. Delana Meyer thanks  Patient will be scheduled as soon as possible.

## 2019-10-26 NOTE — Patient Instructions (Signed)

## 2019-10-30 ENCOUNTER — Encounter (INDEPENDENT_AMBULATORY_CARE_PROVIDER_SITE_OTHER): Payer: Self-pay

## 2019-10-30 NOTE — Telephone Encounter (Addendum)
Spoke with the patient's daughter on 10/29/19 and he is now scheduled for a left brachial cephalic AV fistula with Dr. Delana Meyer on 11/18/19. Patient was offered 11/13/19 but per the daughter she will be out of town. Patient will do a phone call pre-op  On 11/05/19 between 8-1 pm and covid testing on 11/13/19 between 8-1 pm at the [place]. Pre-surgical instructions were discussed and will be mailed.

## 2019-11-04 ENCOUNTER — Other Ambulatory Visit (INDEPENDENT_AMBULATORY_CARE_PROVIDER_SITE_OTHER): Payer: Self-pay | Admitting: Nurse Practitioner

## 2019-11-05 ENCOUNTER — Encounter
Admission: RE | Admit: 2019-11-05 | Discharge: 2019-11-05 | Disposition: A | Discharge status: Home / Self Care | Payer: Medicare Other | Source: Ambulatory Visit | Attending: Vascular Surgery | Admitting: Vascular Surgery

## 2019-11-05 ENCOUNTER — Other Ambulatory Visit: Payer: Self-pay

## 2019-11-05 HISTORY — DX: Gastro-esophageal reflux disease without esophagitis: K21.9

## 2019-11-05 NOTE — Pre-Procedure Instructions (Signed)
COPIED FROM CARE EVERYWHERE  NM myocardial perfusion SPECT multiple (stress and rest)08/18/2018 [place] Result Impression  Negative Lexiscan stress. LV function normal. No evidence of reversible  ischemia. Low risk study.  Result Narrative  CARDIOLOGY DEPARTMENT [facility] [address] [place], [place] [address] [phone]  Procedure: Pharmacologic Myocardial Perfusion Imaging  ONE day procedure  Indication: Coronary artery disease involving native coronary artery of  native heart with angina pectoris (CMS-HCC) Plan: NM myocardial perfusion SPECT multiple (stress     and rest), ECG stress test only  Essential (primary) hypertension Plan: NM myocardial perfusion SPECT multiple (stress     and rest), ECG stress test only  Chest pain at rest, unspecified Plan: NM myocardial perfusion SPECT multiple (stress     and rest), ECG stress test only  Ordering Physician:   Dr. Bartholome Bill   Clinical History: 79 y.o. year old male Vitals: Height: 72 in Weight: 188 lb Cardiac risk factors include:   PVD, Hyperlipidemia, Previous MI, HTN, CABG and CAD    Procedure:  Pharmacologic stress testing was performed with Regadenoson using a single  use 0.4mg /16ml (0.08 mg/ml) prefilled syringe intravenously infused as a  bolus dose. The stress test was stopped due to Infusion completion. Blood  pressure response was normal. The patient did not develop any symptoms  other than fatigue during the procedure.   Rest HR: 99bpm Rest BP: 178/67mmHg Max HR: 110bpm Min BP: 164/17mmHg  Stress Test Administered by: Oswald Hillock, CMA  ECG Interpretation: Rest ECG: normal sinus rhythm, right bundle branch block (RBBB) Stress ECG: normal sinus rhythm, no arrhythmia or ischemia Recovery ECG: normal sinus rhythm ECG Interpretation: negative.   Administrations This Visit   regadenoson (LEXISCAN) 0.4 mg/5 mL inj  syringe 0.4 mg   Admin Date 08/18/2018 [place] Given Dose 0.4 mg Route Intravenous Administered By Herbert Seta, CNMT     technetium Tc14m sestamibi (CARDIOLITE) injection 58.09 millicurie   Admin Date 08/18/2018 Action Given Dose 98.33 millicurie Route Intravenous Administered By Herbert Seta, CNMT     technetium Tc67m sestamibi (CARDIOLITE) injection 82.50 millicurie   Admin Date 08/18/2018 [place] Given Dose 53.97 millicurie Route Intravenous Administered By Herbert Seta, CNMT       Gated post-stress perfusion imaging was performed 30 minutes after stress.  Rest images were performed 30 minutes after injection.  Gated LV Analysis:  TID Ratio: 1.1  LVEF= 51%  FINDINGS: Regional wall motion: reveals normal myocardial thickening and wall  motion. The overall quality of the study is good.  Artifacts noted: no Left ventricular cavity: normal.  Perfusion Analysis: SPECT images demonstrate homogeneous tracer  distribution throughout the myocardium.   Status

## 2019-11-05 NOTE — Patient Instructions (Signed)
Your procedure is scheduled on: 11/18/19 Report to [place]. To find out your arrival time please call [phone] between 1PM - 3PM on 11/17/19 .  Remember: Instructions that are not followed completely may result in serious medical risk, up to and including death, or upon the discretion of your surgeon and anesthesiologist your surgery may need to be rescheduled.     _X__ 1. Do not eat food after midnight the night before your procedure.                 No gum chewing or hard candies. You may drink clear liquids up to 2 hours                 before you are scheduled to arrive for your surgery- DO not drink clear                 liquids within 2 hours of the start of your surgery.                 Clear Liquids include:  water, apple juice without pulp, clear carbohydrate                 drink such as Clearfast or Gatorade, Black Coffee or Tea (Do not add                 anything to coffee or tea). Diabetics water only  __X__2.  On the morning of surgery brush your teeth with toothpaste and water, you                 may rinse your mouth with mouthwash if you wish.  Do not swallow any              toothpaste of mouthwash.     _X__ 3.  No Alcohol for 24 hours before or after surgery.   _X__ 4.  Do Not Smoke or use e-cigarettes For 24 Hours Prior to Your Surgery.                 Do not use any chewable tobacco products for at least 6 hours prior to                 surgery.  ____  5.  Bring all medications with you on the day of surgery if instructed.   __X__  6.  Notify your doctor if there is any change in your medical condition      (cold, fever, infections).     Do not wear jewelry, make-up, hairpins, clips or nail polish. Do not wear lotions, powders, or perfumes.  Do not shave 48 hours prior to surgery. Men may shave face and neck. Do not bring valuables to the hospital.    [facility] is not responsible for any belongings or  valuables.  Contacts, dentures/partials or body piercings may not be worn into surgery. Bring a case for your contacts, glasses or hearing aids, a denture cup will be supplied. Leave your suitcase in the car. After surgery it may be brought to your room. For patients admitted to the hospital, discharge time is determined by your treatment team.   Patients discharged the day of surgery will not be allowed to drive home.   Please read over the following fact sheets that you were given:   MRSA Information  __X__ Take these medicines the morning of surgery with A SIP OF WATER:  1. amLODipine (NORVASC) 10 MG tablet  2. atorvastatin (LIPITOR) 40 MG tablet  3. famotidine (PEPCID) 20 MG tablet  4. finasteride (PROSCAR) 5 MG tablet  5. hydrALAZINE (APRESOLINE) 75 MG tablet  6. predniSONE (DELTASONE) 10 MG tablet  ____ Fleet Enema (as directed)   __X__ Use CHG Soap/SAGE wipes as directed  ____ Use inhalers on the day of surgery  ____ Stop metformin/Janumet/Farxiga 2 days prior to surgery    ____ Take 1/2 of usual insulin dose the night before surgery. No insulin the morning          of surgery.   ____ Stop Blood Thinners Coumadin/Plavix/Xarelto/Pleta/Pradaxa/Eliquis/Effient/Aspirin  on   Or contact your Surgeon, Cardiologist or Medical Doctor regarding  ability to stop your blood thinners  __X__ Stop Anti-inflammatories 7 days before surgery such as Advil, Ibuprofen, Motrin,  BC or Goodies Powder, Naprosyn, Naproxen, Aleve,   __X__ Stop all herbal supplements, fish oil or vitamin E until after surgery.    ____ Bring C-Pap to the hospital.   YOU MAY CONTINUE YOUR ASPIRIN EXCEPT ON THE DAY OF YOUR SURGERY

## 2019-11-13 ENCOUNTER — Other Ambulatory Visit: Payer: Self-pay

## 2019-11-13 ENCOUNTER — Other Ambulatory Visit
Admission: RE | Admit: 2019-11-13 | Discharge: 2019-11-13 | Disposition: A | Discharge status: Home / Self Care | Payer: Medicare Other | Source: Ambulatory Visit | Attending: Vascular Surgery | Admitting: Vascular Surgery

## 2019-11-13 DIAGNOSIS — Z20822 Contact with and (suspected) exposure to covid-19: Secondary | ICD-10-CM | POA: Insufficient documentation

## 2019-11-13 DIAGNOSIS — Z01812 Encounter for preprocedural laboratory examination: Secondary | ICD-10-CM | POA: Insufficient documentation

## 2019-11-13 LAB — CBC WITH DIFFERENTIAL/PLATELET
Abs Immature Granulocytes: 0.08 10*3/uL — ABNORMAL HIGH (ref 0.00–0.07)
Basophils Absolute: 0 10*3/uL (ref 0.0–0.1)
Basophils Relative: 0 %
Eosinophils Absolute: 0.1 10*3/uL (ref 0.0–0.5)
Eosinophils Relative: 0 %
HCT: 31.8 % — ABNORMAL LOW (ref 39.0–52.0)
Hemoglobin: 10.1 g/dL — ABNORMAL LOW (ref 13.0–17.0)
Immature Granulocytes: 1 %
Lymphocytes Relative: 19 %
Lymphs Abs: 2.4 10*3/uL (ref 0.7–4.0)
MCH: 28.8 pg (ref 26.0–34.0)
MCHC: 31.8 g/dL (ref 30.0–36.0)
MCV: 90.6 fL (ref 80.0–100.0)
Monocytes Absolute: 0.4 10*3/uL (ref 0.1–1.0)
Monocytes Relative: 3 %
Neutro Abs: 9.4 10*3/uL — ABNORMAL HIGH (ref 1.7–7.7)
Neutrophils Relative %: 77 %
Platelets: 275 10*3/uL (ref 150–400)
RBC: 3.51 MIL/uL — ABNORMAL LOW (ref 4.22–5.81)
RDW: 16.4 % — ABNORMAL HIGH (ref 11.5–15.5)
WBC: 12.3 10*3/uL — ABNORMAL HIGH (ref 4.0–10.5)
nRBC: 0 % (ref 0.0–0.2)

## 2019-11-13 LAB — BASIC METABOLIC PANEL
Anion gap: 10 (ref 5–15)
BUN: 69 mg/dL — ABNORMAL HIGH (ref 8–23)
CO2: 14 mmol/L — ABNORMAL LOW (ref 22–32)
Calcium: 9.2 mg/dL (ref 8.9–10.3)
Chloride: 119 mmol/L — ABNORMAL HIGH (ref 98–111)
Creatinine, Ser: 2.92 mg/dL — ABNORMAL HIGH (ref 0.61–1.24)
GFR calc Af Amer: 22 mL/min — ABNORMAL LOW (ref >60)
GFR calc non Af Amer: 19 mL/min — ABNORMAL LOW (ref >60)
Glucose, Bld: 100 mg/dL — ABNORMAL HIGH (ref 70–99)
Potassium: 4 mmol/L (ref 3.5–5.1)
Sodium: 143 mmol/L (ref 135–145)

## 2019-11-13 LAB — TYPE AND SCREEN
ABO/RH(D): O POS
Antibody Screen: NEGATIVE

## 2019-11-13 LAB — APTT: aPTT: 26 seconds (ref 24–36)

## 2019-11-13 LAB — PROTIME-INR
INR: 1 (ref 0.8–1.2)
Prothrombin Time: 12.4 seconds (ref 11.4–15.2)

## 2019-11-13 LAB — SARS CORONAVIRUS 2 (TAT 6-24 HRS): SARS Coronavirus 2: NEGATIVE

## 2019-11-18 ENCOUNTER — Other Ambulatory Visit: Payer: Self-pay

## 2019-11-18 ENCOUNTER — Encounter: Payer: Self-pay | Admitting: Vascular Surgery

## 2019-11-18 ENCOUNTER — Ambulatory Visit: Payer: Medicare Other | Admitting: Anesthesiology

## 2019-11-18 ENCOUNTER — Encounter
Admission: RE | Disposition: A | Discharge status: Home / Self Care | Payer: Self-pay | Source: Home / Self Care | Attending: Vascular Surgery

## 2019-11-18 ENCOUNTER — Ambulatory Visit
Admission: RE | Admit: 2019-11-18 | Discharge: 2019-11-18 | Disposition: A | Discharge status: Home / Self Care | Payer: Medicare Other | Attending: Vascular Surgery | Admitting: Vascular Surgery

## 2019-11-18 DIAGNOSIS — Z7982 Long term (current) use of aspirin: Secondary | ICD-10-CM | POA: Insufficient documentation

## 2019-11-18 DIAGNOSIS — N401 Enlarged prostate with lower urinary tract symptoms: Secondary | ICD-10-CM | POA: Insufficient documentation

## 2019-11-18 DIAGNOSIS — I120 Hypertensive chronic kidney disease with stage 5 chronic kidney disease or end stage renal disease: Secondary | ICD-10-CM | POA: Insufficient documentation

## 2019-11-18 DIAGNOSIS — I25119 Atherosclerotic heart disease of native coronary artery with unspecified angina pectoris: Secondary | ICD-10-CM | POA: Insufficient documentation

## 2019-11-18 DIAGNOSIS — I252 Old myocardial infarction: Secondary | ICD-10-CM | POA: Insufficient documentation

## 2019-11-18 DIAGNOSIS — I2510 Atherosclerotic heart disease of native coronary artery without angina pectoris: Secondary | ICD-10-CM | POA: Diagnosis not present

## 2019-11-18 DIAGNOSIS — R0609 Other forms of dyspnea: Secondary | ICD-10-CM | POA: Insufficient documentation

## 2019-11-18 DIAGNOSIS — K219 Gastro-esophageal reflux disease without esophagitis: Secondary | ICD-10-CM | POA: Diagnosis not present

## 2019-11-18 DIAGNOSIS — Z79899 Other long term (current) drug therapy: Secondary | ICD-10-CM | POA: Diagnosis not present

## 2019-11-18 DIAGNOSIS — E1122 Type 2 diabetes mellitus with diabetic chronic kidney disease: Secondary | ICD-10-CM | POA: Insufficient documentation

## 2019-11-18 DIAGNOSIS — E785 Hyperlipidemia, unspecified: Secondary | ICD-10-CM | POA: Insufficient documentation

## 2019-11-18 DIAGNOSIS — Z87891 Personal history of nicotine dependence: Secondary | ICD-10-CM | POA: Diagnosis not present

## 2019-11-18 DIAGNOSIS — Z951 Presence of aortocoronary bypass graft: Secondary | ICD-10-CM | POA: Insufficient documentation

## 2019-11-18 DIAGNOSIS — D631 Anemia in chronic kidney disease: Secondary | ICD-10-CM | POA: Diagnosis not present

## 2019-11-18 DIAGNOSIS — N186 End stage renal disease: Secondary | ICD-10-CM | POA: Diagnosis not present

## 2019-11-18 DIAGNOSIS — Z955 Presence of coronary angioplasty implant and graft: Secondary | ICD-10-CM | POA: Diagnosis not present

## 2019-11-18 DIAGNOSIS — N185 Chronic kidney disease, stage 5: Secondary | ICD-10-CM | POA: Diagnosis not present

## 2019-11-18 HISTORY — PX: AV FISTULA PLACEMENT: SHX1204

## 2019-11-18 LAB — POCT I-STAT, CHEM 8
BUN: 68 mg/dL — ABNORMAL HIGH (ref 8–23)
Calcium, Ion: 1.28 mmol/L (ref 1.15–1.40)
Chloride: 119 mmol/L — ABNORMAL HIGH (ref 98–111)
Creatinine, Ser: 3.5 mg/dL — ABNORMAL HIGH (ref 0.61–1.24)
Glucose, Bld: 109 mg/dL — ABNORMAL HIGH (ref 70–99)
HCT: 27 % — ABNORMAL LOW (ref 39.0–52.0)
Hemoglobin: 9.2 g/dL — ABNORMAL LOW (ref 13.0–17.0)
Potassium: 4 mmol/L (ref 3.5–5.1)
Sodium: 146 mmol/L — ABNORMAL HIGH (ref 135–145)
TCO2: 12 mmol/L — ABNORMAL LOW (ref 22–32)

## 2019-11-18 SURGERY — ARTERIOVENOUS (AV) FISTULA CREATION
Anesthesia: General | Site: Arm Upper | Laterality: Left

## 2019-11-18 MED ORDER — LIDOCAINE HCL (CARDIAC) PF 100 MG/5ML IV SOSY
PREFILLED_SYRINGE | INTRAVENOUS | Status: DC | PRN
  Administered 2019-11-18: 40 mg via INTRAVENOUS

## 2019-11-18 MED ORDER — OXYCODONE HCL 5 MG PO TABS: 5.0000 mg | ORAL_TABLET | Freq: Once | ORAL | Status: DC | PRN

## 2019-11-18 MED ORDER — EPHEDRINE SULFATE 50 MG/ML IJ SOLN
INTRAMUSCULAR | Status: DC | PRN
  Administered 2019-11-18: 10 mg via INTRAVENOUS

## 2019-11-18 MED ORDER — SODIUM CHLORIDE 0.9 % IV SOLN
INTRAVENOUS | Status: DC | PRN
Start: 2019-11-18 — End: 2019-11-18

## 2019-11-18 MED ORDER — OXYCODONE HCL 5 MG/5ML PO SOLN: 5.0000 mg | Freq: Once | ORAL | Status: DC | PRN

## 2019-11-18 MED ORDER — FENTANYL CITRATE (PF) 100 MCG/2ML IJ SOLN
INTRAMUSCULAR | Status: DC | PRN
  Administered 2019-11-18: 50 ug via INTRAVENOUS
  Administered 2019-11-18 (×2): 25 ug via INTRAVENOUS

## 2019-11-18 MED ORDER — HEMOSTATIC AGENTS (NO CHARGE) OPTIME
TOPICAL | Status: DC | PRN
  Administered 2019-11-18: 1 via TOPICAL

## 2019-11-18 MED ORDER — LIDOCAINE HCL (PF) 2 % IJ SOLN
INTRAMUSCULAR | Status: AC
  Filled 2019-11-18: qty 5

## 2019-11-18 MED ORDER — CLINDAMYCIN PHOSPHATE 300 MG/50ML IV SOLN
INTRAVENOUS | Status: AC
  Filled 2019-11-18: qty 50

## 2019-11-18 MED ORDER — PHENYLEPHRINE HCL (PRESSORS) 10 MG/ML IV SOLN
INTRAVENOUS | Status: AC
  Filled 2019-11-18: qty 1

## 2019-11-18 MED ORDER — ONDANSETRON HCL 4 MG/2ML IJ SOLN: 4.0000 mg | Freq: Four times a day (QID) | INTRAMUSCULAR | Status: DC | PRN

## 2019-11-18 MED ORDER — PHENYLEPHRINE HCL (PRESSORS) 10 MG/ML IV SOLN
INTRAVENOUS | Status: DC | PRN
  Administered 2019-11-18 (×2): 100 ug via INTRAVENOUS

## 2019-11-18 MED ORDER — BUPIVACAINE LIPOSOME 1.3 % IJ SUSP
INTRAMUSCULAR | Status: AC
  Filled 2019-11-18: qty 20

## 2019-11-18 MED ORDER — CHLORHEXIDINE GLUCONATE 0.12 % MT SOLN: 15.0000 mL | Freq: Once | OROMUCOSAL | Status: AC

## 2019-11-18 MED ORDER — ONDANSETRON HCL 4 MG/2ML IJ SOLN
INTRAMUSCULAR | Status: DC | PRN
  Administered 2019-11-18: 4 mg via INTRAVENOUS

## 2019-11-18 MED ORDER — BUPIVACAINE LIPOSOME 1.3 % IJ SUSP
INTRAMUSCULAR | Status: DC | PRN
  Administered 2019-11-18: 7.5 mL

## 2019-11-18 MED ORDER — CLINDAMYCIN PHOSPHATE 300 MG/50ML IV SOLN
300.0000 mg | INTRAVENOUS | Status: AC
  Administered 2019-11-18: 300 mg via INTRAVENOUS

## 2019-11-18 MED ORDER — CHLORHEXIDINE GLUCONATE 0.12 % MT SOLN
OROMUCOSAL | Status: AC
  Administered 2019-11-18: 15 mL via OROMUCOSAL
  Filled 2019-11-18: qty 15

## 2019-11-18 MED ORDER — PROPOFOL 10 MG/ML IV BOLUS
INTRAVENOUS | Status: AC
  Filled 2019-11-18: qty 20

## 2019-11-18 MED ORDER — HEPARIN SODIUM (PORCINE) 5000 UNIT/ML IJ SOLN
INTRAMUSCULAR | Status: AC
  Filled 2019-11-18: qty 1

## 2019-11-18 MED ORDER — CHLORHEXIDINE GLUCONATE CLOTH 2 % EX PADS
6.0000 | MEDICATED_PAD | Freq: Once | CUTANEOUS | Status: AC
  Administered 2019-11-18: 6 via TOPICAL

## 2019-11-18 MED ORDER — FENTANYL CITRATE (PF) 100 MCG/2ML IJ SOLN
INTRAMUSCULAR | Status: AC
  Filled 2019-11-18: qty 2

## 2019-11-18 MED ORDER — HYDROCODONE-ACETAMINOPHEN 5-325 MG PO TABS: 1.0000 | ORAL_TABLET | Freq: Four times a day (QID) | ORAL | 0 refills | Status: DC | PRN

## 2019-11-18 MED ORDER — ONDANSETRON HCL 4 MG/2ML IJ SOLN
INTRAMUSCULAR | Status: AC
  Filled 2019-11-18: qty 2

## 2019-11-18 MED ORDER — FENTANYL CITRATE (PF) 100 MCG/2ML IJ SOLN: 25.0000 ug | INTRAMUSCULAR | Status: DC | PRN

## 2019-11-18 MED ORDER — SODIUM CHLORIDE 0.9 % IV SOLN: Freq: Once | INTRAVENOUS | Status: AC

## 2019-11-18 MED ORDER — PROPOFOL 500 MG/50ML IV EMUL
INTRAVENOUS | Status: AC
  Filled 2019-11-18: qty 50

## 2019-11-18 MED ORDER — EPHEDRINE 5 MG/ML INJ
INTRAVENOUS | Status: AC
  Filled 2019-11-18: qty 10

## 2019-11-18 MED ORDER — BUPIVACAINE HCL (PF) 0.5 % IJ SOLN
INTRAMUSCULAR | Status: DC | PRN
  Administered 2019-11-18: 7.5 mL

## 2019-11-18 MED ORDER — ONDANSETRON HCL 4 MG/2ML IJ SOLN: 4.0000 mg | Freq: Once | INTRAMUSCULAR | Status: DC | PRN

## 2019-11-18 MED ORDER — ORAL CARE MOUTH RINSE: 15.0000 mL | Freq: Once | OROMUCOSAL | Status: AC

## 2019-11-18 MED ORDER — SODIUM CHLORIDE 0.9 % IV SOLN
INTRAVENOUS | Status: DC | PRN
  Administered 2019-11-18: 50 ug/min via INTRAVENOUS

## 2019-11-18 MED ORDER — HYDROMORPHONE HCL 1 MG/ML IJ SOLN: 1.0000 mg | Freq: Once | INTRAMUSCULAR | Status: DC | PRN

## 2019-11-18 MED ORDER — PROPOFOL 10 MG/ML IV BOLUS
INTRAVENOUS | Status: DC | PRN
  Administered 2019-11-18: 150 mg via INTRAVENOUS
  Administered 2019-11-18: 50 mg via INTRAVENOUS

## 2019-11-18 MED ORDER — SODIUM CHLORIDE 0.9 % IV SOLN
INTRAVENOUS | Status: DC | PRN
  Administered 2019-11-18: 20 mL via INTRAMUSCULAR

## 2019-11-18 SURGICAL SUPPLY — 61 items
APPLIER CLIP 11 MED OPEN (CLIP)
APPLIER CLIP 9.375 SM OPEN (CLIP)
BAG DECANTER FOR FLEXI CONT (MISCELLANEOUS) ×3 IMPLANT
BLADE SURG SZ11 CARB STEEL (BLADE) ×3 IMPLANT
BOOT SUTURE AID YELLOW STND (SUTURE) ×3 IMPLANT
BRUSH SCRUB EZ  4% CHG (MISCELLANEOUS) ×2
BRUSH SCRUB EZ 4% CHG (MISCELLANEOUS) ×1 IMPLANT
CANISTER SUCT 1200ML W/VALVE (MISCELLANEOUS) ×3 IMPLANT
CHLORAPREP W/TINT 26 (MISCELLANEOUS) ×3 IMPLANT
CLIP APPLIE 11 MED OPEN (CLIP) IMPLANT
CLIP APPLIE 9.375 SM OPEN (CLIP) IMPLANT
COVER WAND RF STERILE (DRAPES) ×3 IMPLANT
DERMABOND ADVANCED (GAUZE/BANDAGES/DRESSINGS) ×2
DERMABOND ADVANCED .7 DNX12 (GAUZE/BANDAGES/DRESSINGS) ×1 IMPLANT
DRESSING SURGICEL FIBRLLR 1X2 (HEMOSTASIS) ×1 IMPLANT
DRSG SURGICEL FIBRILLAR 1X2 (HEMOSTASIS) ×3
ELECT CAUTERY BLADE 6.4 (BLADE) ×3 IMPLANT
ELECT REM PT RETURN 9FT ADLT (ELECTROSURGICAL) ×3
ELECTRODE REM PT RTRN 9FT ADLT (ELECTROSURGICAL) ×1 IMPLANT
GEL ULTRASOUND 20GR AQUASONIC (MISCELLANEOUS) IMPLANT
GLOVE BIO SURGEON STRL SZ7 (GLOVE) ×3 IMPLANT
GLOVE INDICATOR 7.5 STRL GRN (GLOVE) ×3 IMPLANT
GLOVE SURG SYN 8.0 (GLOVE) ×3 IMPLANT
GLOVE SURG SYN 8.0 PF PI (GLOVE) ×1 IMPLANT
GOWN STRL REUS W/ TWL LRG LVL3 (GOWN DISPOSABLE) ×2 IMPLANT
GOWN STRL REUS W/ TWL XL LVL3 (GOWN DISPOSABLE) ×1 IMPLANT
GOWN STRL REUS W/TWL LRG LVL3 (GOWN DISPOSABLE) ×4
GOWN STRL REUS W/TWL XL LVL3 (GOWN DISPOSABLE) ×2
IV NS 500ML (IV SOLUTION) ×2
IV NS 500ML BAXH (IV SOLUTION) ×1 IMPLANT
KIT TURNOVER KIT A (KITS) ×3 IMPLANT
LABEL OR SOLS (LABEL) ×3 IMPLANT
LOOP RED MAXI  1X406MM (MISCELLANEOUS) ×2
LOOP VESSEL MAXI 1X406 RED (MISCELLANEOUS) ×1 IMPLANT
LOOP VESSEL MINI 0.8X406 BLUE (MISCELLANEOUS) ×2 IMPLANT
LOOPS BLUE MINI 0.8X406MM (MISCELLANEOUS) ×2
NDL FILTER BLUNT 18X1 1/2 (NEEDLE) ×1 IMPLANT
NDL HYPO 30X.5 LL (NEEDLE) IMPLANT
NEEDLE FILTER BLUNT 18X 1/2SAF (NEEDLE) ×2
NEEDLE FILTER BLUNT 18X1 1/2 (NEEDLE) ×1 IMPLANT
NEEDLE HYPO 30X.5 LL (NEEDLE) IMPLANT
PACK EXTREMITY (MISCELLANEOUS) ×3 IMPLANT
PAD PREP 24X41 OB/GYN DISP (PERSONAL CARE ITEMS) ×3 IMPLANT
STOCKINETTE 48X4 2 PLY STRL (GAUZE/BANDAGES/DRESSINGS) ×1 IMPLANT
STOCKINETTE STRL 4IN 9604848 (GAUZE/BANDAGES/DRESSINGS) ×3 IMPLANT
SUT MNCRL 4-0 (SUTURE) ×2
SUT MNCRL 4-018XMFL (SUTURE) ×1
SUT MNCRL+ 5-0 UNDYED PC-3 (SUTURE) ×1 IMPLANT
SUT MONOCRYL 5-0 (SUTURE)
SUT PROLENE 6 0 BV (SUTURE) ×12 IMPLANT
SUT SILK 2 0 (SUTURE) ×2
SUT SILK 2-0 18XBRD TIE 12 (SUTURE) ×1 IMPLANT
SUT SILK 3 0 (SUTURE) ×2
SUT SILK 3-0 18XBRD TIE 12 (SUTURE) ×1 IMPLANT
SUT SILK 4 0 (SUTURE) ×2
SUT SILK 4-0 18XBRD TIE 12 (SUTURE) ×1 IMPLANT
SUT VIC AB 3-0 SH 27 (SUTURE) ×2
SUT VIC AB 3-0 SH 27X BRD (SUTURE) ×1 IMPLANT
SUTURE MNCRL 4-018XMF (SUTURE) IMPLANT
SYR 20ML LL LF (SYRINGE) ×3 IMPLANT
SYR 3ML LL SCALE MARK (SYRINGE) ×3 IMPLANT

## 2019-11-18 NOTE — Anesthesia Preprocedure Evaluation (Addendum)
Anesthesia Evaluation  Patient identified by MRN, date of birth, ID band Patient awake    Reviewed: Allergy & Precautions, H&P , NPO status , Patient's Chart, lab work & pertinent test results  Airway Mallampati: III  TM Distance: >3 FB     Dental   Pulmonary shortness of breath and with exertion, former smoker,    breath sounds clear to auscultation       Cardiovascular hypertension, (-) angina+ CAD, + Past MI, + CABG (20 years ago) and + Peripheral Vascular Disease   Rhythm:regular Rate:Normal + Systolic murmurs    Neuro/Psych negative neurological ROS  negative psych ROS   GI/Hepatic Neg liver ROS, hiatal hernia, GERD  ,  Endo/Other  negative endocrine ROS  Renal/GU ESRFRenal disease  negative genitourinary   Musculoskeletal  (+) Arthritis ,   Abdominal   Peds  Hematology negative hematology ROS (+)   Anesthesia Other Findings Past Medical History: No date: Acquired phimosis No date: BPH (benign prostatic hypertrophy) No date: Chronic kidney disease No date: Dyspnea     Comment:  with exertion No date: Elevated PSA No date: GERD (gastroesophageal reflux disease) No date: Gross hematuria No date: Hematuria No date: History of hiatal hernia No date: Hypertension No date: Myocardial infarction [facility])     Comment:  CABG-triple No date: Renal mass, left 11/21/2017: Symptomatic anemia  Past Surgical History: 09/2015: Circumscision 2002: CORONARY ARTERY BYPASS GRAFT     Comment:  x3 @ [facility] 2011: [place]; Left 2014: RENAL BIOPSY     Comment:  neg in w/u renal mass/hematuria     Reproductive/Obstetrics negative OB ROS                           Anesthesia Physical Anesthesia Plan  ASA: III  Anesthesia Plan: General   Post-op Pain Management:    Induction:   PONV Risk Score and Plan: Ondansetron and Dexamethasone  Airway Management Planned:   Additional  Equipment:   Intra-op Plan:   Post-operative Plan:   Informed Consent: I have reviewed the patients History and Physical, chart, labs and discussed the procedure including the risks, benefits and alternatives for the proposed anesthesia with the patient or authorized representative who has indicated his/her understanding and acceptance.     Dental Advisory Given  Plan Discussed with: Anesthesiologist, CRNA and Surgeon  Anesthesia Plan Comments:        Anesthesia Quick Evaluation

## 2019-11-18 NOTE — Anesthesia Procedure Notes (Signed)
Procedure Name: LMA Insertion Date/Time: 11/18/2019 11:24 AM Performed by: Nelda Marseille, CRNA Pre-anesthesia Checklist: Patient identified, Patient being monitored, Timeout performed, Emergency Drugs available and Suction available Patient Re-evaluated:Patient Re-evaluated prior to induction Oxygen Delivery Method: Circle system utilized Preoxygenation: Pre-oxygenation with 100% oxygen Induction Type: IV induction Ventilation: Mask ventilation without difficulty LMA: LMA inserted LMA Size: 4.5 Tube type: Oral Number of attempts: 1 Placement Confirmation: positive ETCO2 and breath sounds checked- equal and bilateral Tube secured with: Tape Dental Injury: Teeth and Oropharynx as per pre-operative assessment

## 2019-11-18 NOTE — Anesthesia Postprocedure Evaluation (Signed)
Anesthesia Post Note  Patient: Arthur Bowen  Procedure(s) Performed: ARTERIOVENOUS (AV) FISTULA CREATION (BRACHIAL CEPHALIC) (Left Arm Upper)  Patient location during evaluation: PACU Anesthesia Type: General Level of consciousness: awake and alert Pain management: pain level controlled Vital Signs Assessment: post-procedure vital signs reviewed and stable Respiratory status: spontaneous breathing, nonlabored ventilation and respiratory function stable Cardiovascular status: blood pressure returned to baseline and stable Postop Assessment: no apparent nausea or vomiting Anesthetic complications: no     Last Vitals:  Vitals:   11/18/19 1251 11/18/19 1259  BP: 134/67 (!) 128/51  Pulse: 70 77  Resp: 17 16  Temp: (!) 36.3 C (!) 36.3 C  SpO2: 99% 98%    Last Pain:  Vitals:   11/18/19 1259  TempSrc:   PainSc: 0-No pain                 Tera Mater

## 2019-11-18 NOTE — Op Note (Signed)
     OPERATIVE NOTE   PROCEDURE: left brachial cephalic arteriovenous fistula placement  PRE-OPERATIVE DIAGNOSIS: End Stage Renal Disease  POST-OPERATIVE DIAGNOSIS: End Stage Renal Disease  SURGEON: Hortencia Pilar  ASSISTANT(S): Ms. Hezzie Bump  ANESTHESIA: general  ESTIMATED BLOOD LOSS: <50 cc  FINDING(S): 3.5 mm cephalic vein  SPECIMEN(S):  none  INDICATIONS:   Arthur Bowen is a 79 y.o. male who presents with end stage renal disease.  The patient is scheduled for left brachiocephalic arteriovenous fistula placement.  The patient is aware the risks include but are not limited to: bleeding, infection, steal syndrome, nerve damage, ischemic monomelic neuropathy, failure to mature, and need for additional procedures.  The patient is aware of the risks of the procedure and elects to proceed forward.  DESCRIPTION: After full informed written consent was obtained from the patient, the patient was brought back to the operating room and placed supine upon the operating table.  Prior to induction, the patient received IV antibiotics.   After obtaining adequate anesthesia, the patient was then prepped and draped in the standard fashion for a left arm access procedure.   A first assistant was required to provide a safe and appropriate environment for executing the surgery.  The assistant was integral in providing retraction, exposure, running suture providing suction and in the closing process.   A curvilinear incision was then created midway between the radial impulse and the cephalic vein. The cephalic vein was then identified and dissected circumferentially. It was marked with a surgical marker.    Attention was then turned to the brachial artery which was exposed through the same incision and looped proximally and distally. Side branches were controlled with 4-0 silk ties.  The distal segment of the vein was ligated with a  2-0 silk, and the vein was transected.  The proximal  segment was interrogated with serial dilators.  The vein accepted up to a 3.5 mm dilator without any difficulty. Heparinized saline was infused into the vein and clamped it with a small bulldog.  At this point, I reset my exposure of the brachial artery and controlled the artery with vessel loops proximally and distally.  An arteriotomy was then made with a #11 blade, and extended with a Potts scissor.  Heparinized saline was injected proximal and distal into the radial artery.  The vein was then approximated to the artery while the artery was in its native bed and subsequently the vein was beveled using Potts scissors. The vein was then sewn to the artery in an end-to-side configuration with a running stitch of 6-0 Prolene.  Prior to completing this anastomosis Flushing maneuvers were performed and the artery was allowed to forward and back bleed.  There was no evidence of clot from any vessels.  I completed the anastomosis in the usual fashion and then released all vessel loops and clamps.    There was good  thrill in the venous outflow, and there was 1+ palpable radial pulse.  At this point, I irrigated out the surgical wound.  There was no further active bleeding.  The subcutaneous tissue was reapproximated with a running stitch of 3-0 Vicryl.  The skin was then reapproximated with a running subcuticular stitch of 4-0 Vicryl.  The skin was then cleaned, dried, and reinforced with Dermabond.    The patient tolerated this procedure well.   COMPLICATIONS: None  CONDITION: [facility]  Office: [phone]   11/18/2019, 12:10 PM

## 2019-11-18 NOTE — Transfer of Care (Signed)
Immediate Anesthesia Transfer of Care Note  Patient: Arthur Bowen  Procedure(s) Performed: ARTERIOVENOUS (AV) FISTULA CREATION (BRACHIAL CEPHALIC) (Left Arm Upper)  Patient Location: PACU  Anesthesia Type:General  Level of Consciousness: drowsy  Airway & Oxygen Therapy: Patient Spontanous Breathing and Patient connected to nasal cannula oxygen  Post-op Assessment: Report given to RN  Post vital signs: stable  Last Vitals:  Vitals Value Taken Time  BP 130/58 11/18/19 1221  Temp    Pulse 73 11/18/19 1222  Resp 27 11/18/19 1222  SpO2 99 % 11/18/19 1222  Vitals shown include unvalidated device data.  Last Pain:  Vitals:   11/18/19 1012  TempSrc:   PainSc: 0-No pain         Complications: No apparent anesthesia complications

## 2019-11-18 NOTE — H&P (Signed)
_0 @   MRN : [id number]  Arthur Bowen is a 79 y.o. (November 27, 1940) male who presents with chief complaint of No chief complaint on file. Marland Kitchen  History of Present Illness:   The patient presents to [facility] today for creation of an AV fistula.  He has chronic renal insufficiency stage V secondary to hypertension and diabetes. The patient's most recent creatinine clearance is less than 20. The patient volume status has not yet become an issue. Patient's blood pressures been relatively well controlled. There are mild uremic symptoms which appear to be relatively well tolerated at this time. The patient is right-handed.  The patient has been considering the various methods of dialysis and wishes to proceed with hemodialysis and therefore creation of AV access.  The patient denies amaurosis fugax or recent TIA symptoms. There are no recent neurological changes noted. The patient denies claudication symptoms or rest pain symptoms. The patient denies history of DVT, PE or superficial thrombophlebitis. The patient denies recent episodes of angina or shortness of breath.   Today noninvasive studies show adequate vein size for creation of a left brachiocephalic AV fistula  Current Meds  Medication Sig  . amLODipine (NORVASC) 10 MG tablet Take 10 mg by mouth daily.  Marland Kitchen aspirin 81 MG tablet Take 1 tablet by mouth daily. Reported on 09/02/2015  . atorvastatin (LIPITOR) 40 MG tablet Take 1 tablet by mouth once daily  . Cyanocobalamin (B-12) 1000 MCG/ML KIT Inject 1,000 mcg as directed every 30 (thirty) days.  . Cyanocobalamin (VITAMIN B 12 PO) Take 1,000 mcg by mouth daily.  . famotidine (PEPCID) 20 MG tablet Take 1 tablet (20 mg total) by mouth daily.  . finasteride (PROSCAR) 5 MG tablet Take 1 tablet (5 mg total) by mouth daily.  . hydrALAZINE (APRESOLINE) 25 MG tablet Take 25 mg by mouth 3 (three) times daily.  . hydrALAZINE (APRESOLINE) 50 MG tablet Take 50 mg by mouth 3  (three) times daily.   Marland Kitchen lisinopril (ZESTRIL) 20 MG tablet Take 1 tablet by mouth once daily  . patiromer (VELTASSA) 8.4 g packet Take 8.4 g by mouth daily.  . predniSONE (DELTASONE) 10 MG tablet Take 10 mg by mouth daily with breakfast.     Past Medical History:  Diagnosis Date  . Acquired phimosis   . BPH (benign prostatic hypertrophy)   . Chronic kidney disease   . Dyspnea    with exertion  . Elevated PSA   . GERD (gastroesophageal reflux disease)   . Gross hematuria   . Hematuria   . History of hiatal hernia   . Hypertension   . 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 Renal mass, left   . Symptomatic anemia 11/21/2017    Past Surgical History:  Procedure Laterality Date  . Circumscision  09/2015  . CORONARY ARTERY BYPASS GRAFT  2002   x3 @ [place]  . INGUINAL HERNIA REPAIR Left 2011  . RENAL BIOPSY  2014   neg in w/u renal mass/hematuria    Social History Social History   Tobacco Use  . Smoking status: Former Smoker    Packs/day: 0.50    Years: 50.00    Pack years: 25.00    Types: Cigarettes    Quit date: 10/10/2017    Years since quitting: 2.1  . Smokeless tobacco: Former Systems developer    Types: Chew  . Tobacco comment: last cig 10/10/17   Substance Use Topics  . Alcohol use: No  Alcohol/week: 0.0 standard drinks  . Drug use: No    Family History Family History  Problem Relation Age of Onset  . Lymphoma Mother   . Hodgkin's lymphoma Mother   . Lung cancer Father   . Heart disease Brother   . Dementia Sister   . Dementia Brother   . Breast cancer Maternal Aunt     Allergies  Allergen Reactions  . Penicillins Hives  . Sulfa Antibiotics Hives     REVIEW OF SYSTEMS (Negative unless checked)  Constitutional: []Weight loss  []Fever  []Chills Cardiac: []Chest pain   []Chest pressure   []Palpitations   []Shortness of breath when laying flat   []Shortness of breath with exertion. Vascular:  []Pain in legs with walking   []Pain in legs at rest   []History of DVT   []Phlebitis   []Swelling in legs   []Varicose veins   []Non-healing ulcers Pulmonary:   []Uses home oxygen   []Productive cough   []Hemoptysis   []Wheeze  []COPD   []Asthma Neurologic:  []Dizziness   []Seizures   []History of stroke   []History of TIA  []Aphasia   []Vissual changes   []Weakness or numbness in arm   []Weakness or numbness in leg Musculoskeletal:   []Joint swelling   []Joint pain   []Low back pain Hematologic:  []Easy bruising  []Easy bleeding   []Hypercoagulable state   []Anemic Gastrointestinal:  []Diarrhea   []Vomiting  []Gastroesophageal reflux/heartburn   []Difficulty swallowing. Genitourinary:  [x]Chronic kidney disease   []Difficult urination  []Frequent urination   []Blood in urine Skin:  []Rashes   []Ulcers  Psychological:  []History of anxiety   [] History of major depression.  Physical Examination  Vitals:   11/18/19 1010 11/18/19 1012  BP: (!) 158/61   Pulse: 91   Resp: 18   Temp: 97.8 F (36.6 C)   TempSrc: Tympanic   SpO2: 99%   Weight:  81.6 kg  Height:  6' (1.829 m)   Body mass index is 24.41 kg/m. Gen: WD/WN, NAD Head: [place]/AT, No temporalis wasting.  Ear/Nose/Throat: Hearing grossly intact, nares w/o erythema or drainage Eyes: PER, EOMI, sclera nonicteric.  Neck: Supple, no large masses.   Pulmonary:  Good air movement, no audible wheezing bilaterally, no use of accessory muscles.  Cardiac: RRR, no JVD Vascular: Palpable vein in the antecubital fossa on the left Vessel Right Left  Radial Palpable Palpable  Brachial Palpable Palpable  Gastrointestinal: Non-distended. No guarding/no peritoneal signs.  Musculoskeletal: M/S 5/5 throughout.  No deformity or atrophy.  Neurologic: CN 2-12 intact. Symmetrical.  Speech is fluent. Motor exam as listed above. Psychiatric: Judgment intact, Mood & affect appropriate for pt's clinical situation. Dermatologic: No rashes or ulcers noted.  No changes consistent with cellulitis.  CBC Lab  Results  Component Value Date   WBC 12.3 (H) 11/13/2019   HGB 9.2 (L) 11/18/2019   HCT 27.0 (L) 11/18/2019   MCV 90.6 11/13/2019   PLT 275 11/13/2019    BMET    Component Value Date/Time   NA 146 (H) 11/18/2019 1036   NA 143 06/05/2018 0837   K 4.0 11/18/2019 1036   CL 119 (H) 11/18/2019 1036   CO2 14 (L) 11/13/2019 0914   GLUCOSE 109 (H) 11/18/2019 1036   BUN 68 (H) 11/18/2019 1036   BUN 42 (H) 06/05/2018 0837   CREATININE 3.50 (H) 11/18/2019 1036   CALCIUM 9.2 11/13/2019 0914   GFRNONAA 19 (L) 11/13/2019 0914   GFRAA 22 (L)  11/13/2019 0914   Estimated Creatinine Clearance: 18.2 mL/min (A) (by C-G formula based on SCr of 3.5 mg/dL (H)).  COAG Lab Results  Component Value Date   INR 1.0 11/13/2019   INR 0.94 08/06/2018   INR 0.87 07/29/2017    Radiology No results found.  Assessment/Plan 1. CKD stage 4 secondary to hypertension ([place]) Recommend:  At this time the patient does not have appropriate extremity access for dialysis  Patient should have a left brachiocephalic created.  In the instance that the cephalic vein is found to be sclerotic or possibly smaller than what was estimated on ultrasound, a graft may be utilized instead.  The risks, benefits and alternative therapies were reviewed in detail with the patient.  All questions were answered.  The patient agrees to proceed with surgery.    2. Essential (primary) hypertension Continue antihypertensive medications as already ordered, these medications have been reviewed and there are no changes at this time.   3. Coronary artery disease involving native coronary artery of native heart with angina pectoris ([place]) Continue cardiac and antihypertensive medications as already ordered and reviewed, no changes at this time.  Continue statin as ordered and reviewed, no changes at this time  Nitrates PRN for chest pain   4. Dyslipidemia Continue statin as ordered and reviewed, no changes at this  time   Hortencia Pilar, MD  11/18/2019 10:39 AM

## 2019-11-18 NOTE — Discharge Instructions (Signed)
AMBULATORY SURGERY  °DISCHARGE INSTRUCTIONS ° ° °1) The drugs that you were given will stay in your system until tomorrow so for the next 24 hours you should not: ° °A) Drive an automobile °B) Make any legal decisions °C) Drink any alcoholic beverage ° ° °2) You may resume regular meals tomorrow.  Today it is better to start with liquids and gradually work up to solid foods. ° °You may eat anything you prefer, but it is better to start with liquids, then soup and crackers, and gradually work up to solid foods. ° ° °3) Please notify your doctor immediately if you have any unusual bleeding, trouble breathing, redness and pain at the surgery site, drainage, fever, or pain not relieved by medication. ° ° ° °4) Additional Instructions: ° ° ° ° ° ° ° °Please contact your physician with any problems or Same Day Surgery at [phone], Monday through Friday 6 am to 4 pm, or [place] at [place] Main number at [phone]. °

## 2019-11-23 ENCOUNTER — Other Ambulatory Visit: Payer: Self-pay

## 2019-11-23 ENCOUNTER — Inpatient Hospital Stay: Payer: Medicare Other

## 2019-11-23 ENCOUNTER — Inpatient Hospital Stay: Payer: Medicare Other | Attending: Hematology and Oncology

## 2019-11-23 VITALS — BP 157/73 | HR 82 | Temp 97.0°F | Resp 18

## 2019-11-23 DIAGNOSIS — N184 Chronic kidney disease, stage 4 (severe): Secondary | ICD-10-CM

## 2019-11-23 DIAGNOSIS — D631 Anemia in chronic kidney disease: Secondary | ICD-10-CM | POA: Insufficient documentation

## 2019-11-23 DIAGNOSIS — D649 Anemia, unspecified: Secondary | ICD-10-CM

## 2019-11-23 DIAGNOSIS — E538 Deficiency of other specified B group vitamins: Secondary | ICD-10-CM

## 2019-11-23 DIAGNOSIS — I129 Hypertensive chronic kidney disease with stage 1 through stage 4 chronic kidney disease, or unspecified chronic kidney disease: Secondary | ICD-10-CM

## 2019-11-23 LAB — HEMOGLOBIN AND HEMATOCRIT, BLOOD
HCT: 29.9 % — ABNORMAL LOW (ref 39.0–52.0)
Hemoglobin: 9.4 g/dL — ABNORMAL LOW (ref 13.0–17.0)

## 2019-11-23 MED ORDER — EPOETIN ALFA-EPBX 40000 UNIT/ML IJ SOLN
40000.0000 [IU] | Freq: Once | INTRAMUSCULAR | Status: AC
  Administered 2019-11-23: 40000 [IU] via SUBCUTANEOUS
  Filled 2019-11-23: qty 1

## 2019-11-26 DIAGNOSIS — N2581 Secondary hyperparathyroidism of renal origin: Secondary | ICD-10-CM | POA: Diagnosis not present

## 2019-11-26 DIAGNOSIS — I10 Essential (primary) hypertension: Secondary | ICD-10-CM | POA: Diagnosis not present

## 2019-11-26 DIAGNOSIS — N184 Chronic kidney disease, stage 4 (severe): Secondary | ICD-10-CM | POA: Diagnosis not present

## 2019-11-26 DIAGNOSIS — D631 Anemia in chronic kidney disease: Secondary | ICD-10-CM | POA: Diagnosis not present

## 2019-11-28 ENCOUNTER — Other Ambulatory Visit: Payer: Self-pay | Admitting: Internal Medicine

## 2019-11-28 DIAGNOSIS — I25119 Atherosclerotic heart disease of native coronary artery with unspecified angina pectoris: Secondary | ICD-10-CM

## 2019-11-28 NOTE — Telephone Encounter (Signed)
Requested Prescriptions  Pending Prescriptions Disp Refills  . nitroGLYCERIN (NITROSTAT) 0.4 MG SL tablet [Pharmacy Med Name: Nitroglycerin 0.4 MG Sublingual Tablet Sublingual] 50 tablet 0    Sig: PLACE 1 TABLET UNDER THE TONGUE EVERY 5 MINUTES AS NEEDED FOR CHEST PAIN     Cardiovascular:  Nitrates Failed - 11/28/2019 11:12 AM      Failed - Last BP in normal range    BP Readings from Last 1 Encounters:  11/23/19 (!) 157/73         Passed - Last Heart Rate in normal range    Pulse Readings from Last 1 Encounters:  11/23/19 82         Passed - Valid encounter within last 12 months    Recent Outpatient Visits          3 months ago Annual physical exam   [facility] Glean Hess, MD   7 months ago Essential (primary) hypertension   [facility] Glean Hess, MD   1 year ago Annual physical exam   [facility] Glean Hess, MD   1 year ago Community acquired pneumonia of right upper lobe of lung [facility])   [facility] Glean Hess, MD   1 year ago Essential (primary) hypertension   [facility] Glean Hess, MD      Future Appointments            In 8 months Army Melia Jesse Sans, MD [facility]

## 2019-12-03 ENCOUNTER — Other Ambulatory Visit (INDEPENDENT_AMBULATORY_CARE_PROVIDER_SITE_OTHER): Payer: Self-pay | Admitting: Vascular Surgery

## 2019-12-03 DIAGNOSIS — N186 End stage renal disease: Secondary | ICD-10-CM

## 2019-12-03 DIAGNOSIS — Z9889 Other specified postprocedural states: Secondary | ICD-10-CM

## 2019-12-04 ENCOUNTER — Ambulatory Visit (INDEPENDENT_AMBULATORY_CARE_PROVIDER_SITE_OTHER): Payer: Medicare Other

## 2019-12-04 ENCOUNTER — Ambulatory Visit (INDEPENDENT_AMBULATORY_CARE_PROVIDER_SITE_OTHER): Payer: Medicare Other | Admitting: Nurse Practitioner

## 2019-12-04 ENCOUNTER — Encounter (INDEPENDENT_AMBULATORY_CARE_PROVIDER_SITE_OTHER): Payer: Self-pay | Admitting: Nurse Practitioner

## 2019-12-04 ENCOUNTER — Other Ambulatory Visit: Payer: Self-pay

## 2019-12-04 VITALS — BP 134/68 | HR 88 | Resp 16 | Wt 190.8 lb

## 2019-12-04 DIAGNOSIS — E785 Hyperlipidemia, unspecified: Secondary | ICD-10-CM

## 2019-12-04 DIAGNOSIS — I10 Essential (primary) hypertension: Secondary | ICD-10-CM

## 2019-12-04 DIAGNOSIS — I129 Hypertensive chronic kidney disease with stage 1 through stage 4 chronic kidney disease, or unspecified chronic kidney disease: Secondary | ICD-10-CM

## 2019-12-04 DIAGNOSIS — Z9889 Other specified postprocedural states: Secondary | ICD-10-CM | POA: Diagnosis not present

## 2019-12-04 DIAGNOSIS — N186 End stage renal disease: Secondary | ICD-10-CM | POA: Diagnosis not present

## 2019-12-04 DIAGNOSIS — N184 Chronic kidney disease, stage 4 (severe): Secondary | ICD-10-CM

## 2019-12-07 ENCOUNTER — Encounter (INDEPENDENT_AMBULATORY_CARE_PROVIDER_SITE_OTHER): Payer: Self-pay | Admitting: Nurse Practitioner

## 2019-12-07 NOTE — Progress Notes (Signed)
Subjective:    Patient ID: Arthur Bowen, male    DOB: 27-Jun-1940, 79 y.o.   MRN: [id number] Chief Complaint  Patient presents with  . Follow-up    [facility] 2wk HDA    Arthur Bowen is an 79 year old male that underwent left brachiocephalic AV fistula creation on 11/18/2019.  Since that time the patient has been doing well.  The patient has had some swelling in the upper extremity however nothing significant.  He denies any fever, chills, nausea vomiting or diarrhea following surgery.  He denies any signs and symptoms of steal syndrome.  The fistula currently has a very good thrill and bruit however it is not very prominent yet.  Today noninvasive studies show flow volume of 1505.  The brachiocephalic AV fistula is patent with no evidence of hemodynamically significant stenosis.   Review of Systems  Respiratory: Negative for chest tightness and shortness of breath.   All other systems reviewed and are negative.      Objective:   Physical Exam Vitals reviewed.  Cardiovascular:     Rate and Rhythm: Normal rate and regular rhythm.     Pulses: Normal pulses.          Radial pulses are 2+ on the left side.     Heart sounds: Normal heart sounds.     Arteriovenous access: left arteriovenous access is present.    Comments: Left brachiocephalic AV fistula with good thrill and bruit Pulmonary:     Effort: Pulmonary effort is normal.     Breath sounds: Normal breath sounds.  Skin:    General: Skin is warm and dry.     Capillary Refill: Capillary refill takes less than 2 seconds.  Neurological:     Mental Status: He is alert and oriented to person, place, and time.  Psychiatric:        Mood and Affect: Mood normal.        Behavior: Behavior normal.        Thought Content: Thought content normal.        Judgment: Judgment normal.     BP 134/68 (BP Location: Right Arm)   Pulse 88   Resp 16   Wt 190 lb 12.8 oz (86.5 kg)   BMI 25.88 kg/m   Past Medical History:  Diagnosis Date  .  Acquired phimosis   . BPH (benign prostatic hypertrophy)   . Chronic kidney disease   . Dyspnea    with exertion  . Elevated PSA   . GERD (gastroesophageal reflux disease)   . Gross hematuria   . Hematuria   . History of hiatal hernia   . Hypertension   . 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 Renal mass, left   . Symptomatic anemia 11/21/2017    Social History   Socioeconomic History  . Marital status: Divorced    Spouse name: Not on file  . Number of children: 2  . Years of education: Not on file  . Highest education level: Not on file  Occupational History  . Not on file  Tobacco Use  . Smoking status: Former Smoker    Packs/day: 0.50    Years: 50.00    Pack years: 25.00    Types: Cigarettes    Quit date: 10/10/2017    Years since quitting: 2.1  . Smokeless tobacco: Former Systems developer    Types: Chew  . Tobacco comment: last cig 10/10/17   Vaping Use  . Vaping Use: Never used  Substance  and Sexual Activity  . Alcohol use: No    Alcohol/week: 0.0 standard drinks  . Drug use: No  . Sexual activity: Not Currently  Other Topics Concern  . Not on file  Social History Narrative  . Not on file   Social Determinants of Health   Financial Resource Strain:   . Difficulty of Paying Living Expenses:   Food Insecurity:   . Worried About Charity fundraiser in the Last Year:   . Arboriculturist in the Last Year:   Transportation Needs:   . Film/video editor (Medical):   Marland Kitchen Lack of Transportation (Non-Medical):   Physical Activity:   . Days of Exercise per Week:   . Minutes of Exercise per Session:   Stress:   . Feeling of Stress :   Social Connections:   . Frequency of Communication with Friends and Family:   . Frequency of Social Gatherings with Friends and Family:   . Attends Religious Services:   . Active Member of Clubs or Organizations:   . Attends Archivist Meetings:   Marland Kitchen Marital Status:   Intimate Partner Violence:   . Fear of Current or  Ex-Partner:   . Emotionally Abused:   Marland Kitchen Physically Abused:   . Sexually Abused:     Past Surgical History:  Procedure Laterality Date  . AV FISTULA PLACEMENT Left 11/18/2019   Procedure: ARTERIOVENOUS (AV) FISTULA CREATION (BRACHIAL CEPHALIC);  Surgeon: Katha Cabal, MD;  Location: [facility] ORS;  Service: Vascular;  Laterality: Left;  . Circumscision  09/2015  . CORONARY ARTERY BYPASS GRAFT  2002   x3 @ [place]  . INGUINAL HERNIA REPAIR Left 2011  . RENAL BIOPSY  2014   neg in w/u renal mass/hematuria    Family History  Problem Relation Age of Onset  . Lymphoma Mother   . Hodgkin's lymphoma Mother   . Lung cancer Father   . Heart disease Brother   . Dementia Sister   . Dementia Brother   . Breast cancer Maternal Aunt     Allergies  Allergen Reactions  . Penicillins Hives  . Sulfa Antibiotics Hives       Assessment & Plan:   1. CKD stage 4 secondary to hypertension ([place]) Today noninvasive studies show that the patient has adequate flow volume currently for dialysis, however his fistula still not quite mature.  It is very possible that this is due to the fact that the patient fistula is approximately 55 weeks old.  We will have the patient return in 6 weeks to evaluate fistula maturation without studies.  2. Essential (primary) hypertension Continue antihypertensive medications as already ordered, these medications have been reviewed and there are no changes at this time.   3. Dyslipidemia Continue statin as ordered and reviewed, no changes at this time    Current Outpatient Medications on File Prior to Visit  Medication Sig Dispense Refill  . amLODipine (NORVASC) 10 MG tablet Take 10 mg by mouth daily.    Marland Kitchen aspirin 81 MG tablet Take 1 tablet by mouth daily. Reported on 09/02/2015    . atorvastatin (LIPITOR) 40 MG tablet Take 1 tablet by mouth once daily 90 tablet 3  . Cyanocobalamin (B-12) 1000 MCG/ML KIT Inject 1,000 mcg as directed every 30 (thirty) days. 1 kit  6  . Cyanocobalamin (VITAMIN B 12 PO) Take 1,000 mcg by mouth daily.    . famotidine (PEPCID) 20 MG tablet Take 1 tablet (20 mg total) by  mouth daily. 90 tablet 3  . finasteride (PROSCAR) 5 MG tablet Take 1 tablet (5 mg total) by mouth daily. 90 tablet 3  . hydrALAZINE (APRESOLINE) 25 MG tablet Take 25 mg by mouth 3 (three) times daily.    . hydrALAZINE (APRESOLINE) 50 MG tablet Take 50 mg by mouth 3 (three) times daily.     Marland Kitchen HYDROcodone-acetaminophen (NORCO) 5-325 MG tablet Take 1-2 tablets by mouth every 6 (six) hours as needed. 50 tablet 0  . lisinopril (ZESTRIL) 20 MG tablet Take 1 tablet by mouth once daily 90 tablet 3  . nitroGLYCERIN (NITROSTAT) 0.4 MG SL tablet PLACE 1 TABLET UNDER THE TONGUE EVERY 5 MINUTES AS NEEDED FOR CHEST PAIN 50 tablet 0  . patiromer (VELTASSA) 8.4 g packet Take 8.4 g by mouth daily.    . predniSONE (DELTASONE) 10 MG tablet Take 10 mg by mouth daily with breakfast.     . sodium bicarbonate 650 MG tablet Take by mouth.     No current facility-administered medications on file prior to visit.    There are no Patient Instructions on file for this visit. No follow-ups on file.   Kris Hartmann, NP

## 2019-12-10 DIAGNOSIS — D631 Anemia in chronic kidney disease: Secondary | ICD-10-CM | POA: Diagnosis not present

## 2019-12-10 DIAGNOSIS — N041 Nephrotic syndrome with focal and segmental glomerular lesions: Secondary | ICD-10-CM | POA: Diagnosis not present

## 2019-12-10 DIAGNOSIS — N2581 Secondary hyperparathyroidism of renal origin: Secondary | ICD-10-CM | POA: Diagnosis not present

## 2019-12-10 DIAGNOSIS — N184 Chronic kidney disease, stage 4 (severe): Secondary | ICD-10-CM | POA: Diagnosis not present

## 2019-12-10 DIAGNOSIS — I10 Essential (primary) hypertension: Secondary | ICD-10-CM | POA: Diagnosis not present

## 2019-12-17 DIAGNOSIS — N390 Urinary tract infection, site not specified: Secondary | ICD-10-CM | POA: Diagnosis not present

## 2019-12-20 NOTE — Progress Notes (Signed)
[facility]  [address], [address] [place], [place] [address] Phone: [phone]  Fax: [phone]   Clinic Day:  12/22/2019  Referring physician: Glean Hess, MD  Chief Complaint: Arthur Bowen is a 79 y.o. male with anemia of chronic kidney disease and B12 deficiency who is seen for 3 month assessment.   HPI: The patient was last seen in the hematology clinic on 07/07/2019. At that time, he felt "ok".  Weight was down 9 pounds.  He denied any bleeding.  Exam was stable. Hematocrit was 35.2, hemoglobin 10.9, and MCV 92.6.  He received Retacrit 40,000 units.  The patient had arteriovenous fistula surgery on 11/18/2019 with Dr. Delana Meyer.  The patient saw Eulogio Ditch, NP on 12/04/2019. The patient had adequate flow volume for dialysis, but his fistula was still not mature. Will follow up in 6 weeks.  The patient saw Dr. Holley Raring on 12/10/2019. It was felt that his chronic kidney disease had improved. Most recent CrCl was up to 18 ml/min. Will follow up in 6 weeks.  The patient received Retacrit 40,000 units on 10/26/2019 and 11/23/2019.  Labs followed: 10/19/2019: hematocrit 33.9, hemoglobin 10.6. 10/26/2019: hematocrit 33.2, hemoglobin 10.4. 11/13/2019: hematocrit 31.8, hemoglobin 10.1. 11/23/2019: hematocrit 29.9, hemoglobin   9.4. 11/26/2019: hematocrit 29.3, hemoglobin   9.1.  Additional labs: 10/05/2019: Creatinine 3.41. 10/19/2019: Creatinine 3.25. 11/26/2019: Creatinine 3.13. 12/10/2019: Creatinine 3.28.  During the interim, he has not been feeling good. He reports achy knees and leg swelling that makes it difficult for him to walk. He was given medication for a UTI and took them last week.  He denies urgency, incontinence, and frequency. He also has occasional blurry vision. His heartburn has resolved.  He denies bleeding of any kind or chest pain.  The patient does not feel like he needs a transfusion at this time. He takes oral iron, but is  unsure how much.  He reports that he has never had a colonoscopy.  The patient received the [place] COVID-19 vaccine on 08/08/2019 and 09/01/2019.   Past Medical History:  Diagnosis Date  . Acquired phimosis   . BPH (benign prostatic hypertrophy)   . Chronic kidney disease   . Dyspnea    with exertion  . Elevated PSA   . GERD (gastroesophageal reflux disease)   . Gross hematuria   . Hematuria   . History of hiatal hernia   . Hypertension   . 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 Renal mass, left   . Symptomatic anemia 11/21/2017    Past Surgical History:  Procedure Laterality Date  . AV FISTULA PLACEMENT Left 11/18/2019   Procedure: ARTERIOVENOUS (AV) FISTULA CREATION (BRACHIAL CEPHALIC);  Surgeon: Katha Cabal, MD;  Location: [facility] ORS;  Service: Vascular;  Laterality: Left;  . Circumscision  09/2015  . CORONARY ARTERY BYPASS GRAFT  2002   x3 @ [place]  . DIALYSIS/PERMA CATHETER INSERTION N/A 12/25/2019   Procedure: DIALYSIS/PERMA CATHETER INSERTION;  Surgeon: Katha Cabal, MD;  Location: [facility];  Service: Cardiovascular;  Laterality: N/A;  . INGUINAL HERNIA REPAIR Left 2011  . RENAL BIOPSY  2014   neg in w/u renal mass/hematuria    Family History  Problem Relation Age of Onset  . Lymphoma Mother   . Hodgkin's lymphoma Mother   . Lung cancer Father   . Heart disease Brother   . Dementia Sister   . Dementia Brother   . Breast cancer Maternal Aunt  Social History:  reports that he quit smoking about 2 years ago. His smoking use included cigarettes. He has a 25.00 pack-year smoking history. He has quit using smokeless tobacco.  His smokeless tobacco use included chew. He reports that he does not drink alcohol and does not use drugs. His granddaughter is a EMT and she is going to school to be a Marine scientist. He notes she will finish nursing school in x 2 years.He lives in [place]. The patient is alone today.  Allergies:  Allergies  Allergen  Reactions  . Penicillins Hives  . Sulfa Antibiotics Hives    Current Medications: No current facility-administered medications for this visit.   No current outpatient medications on file.   Facility-Administered Medications Ordered in Other Visits  Medication Dose Route Frequency Provider Last Rate Last Admin  . aspirin EC tablet 81 mg  81 mg Oral Daily Algernon Huxley, MD   81 mg at 12/28/19 1857  . atorvastatin (LIPITOR) tablet 40 mg  40 mg Oral Daily Algernon Huxley, MD   40 mg at 12/28/19 0851  . Chlorhexidine Gluconate Cloth 2 % PADS 6 each  6 each Topical Q0600 Algernon Huxley, MD   6 each at 12/28/19 0521  . fentaNYL (SUBLIMAZE) 100 MCG/2ML injection           . finasteride (PROSCAR) tablet 5 mg  5 mg Oral Daily Algernon Huxley, MD   5 mg at 12/28/19 0851  . furosemide (LASIX) 250 mg in dextrose 5 % 250 mL (1 mg/mL) infusion  8 mg/hr Intravenous Continuous Algernon Huxley, MD 8 mL/hr at 12/28/19 2200 8 mg/hr at 12/28/19 2200  . heparin 10000 UNIT/ML injection           . heparin injection 5,000 Units  5,000 Units Subcutaneous Q8H Algernon Huxley, MD   5,000 Units at 12/28/19 2208  . HYDROmorphone (DILAUDID) injection 1 mg  1 mg Intravenous Once PRN Algernon Huxley, MD      . loratadine (CLARITIN) tablet 10 mg  10 mg Oral Daily Algernon Huxley, MD   10 mg at 12/28/19 0851  . midazolam (VERSED) 5 MG/5ML injection           . ondansetron (ZOFRAN) injection 4 mg  4 mg Intravenous Q6H PRN Algernon Huxley, MD      . ondansetron [facility]) injection 4 mg  4 mg Intravenous Q6H PRN Algernon Huxley, MD      . vitamin B-12 (CYANOCOBALAMIN) tablet 1,000 mcg  1,000 mcg Oral Daily Algernon Huxley, MD   1,000 mcg at 12/28/19 9628    Review of Systems  Constitutional: Negative for chills, diaphoresis, fever, malaise/fatigue and weight loss.       Not feeling well.  HENT: Negative.  Negative for congestion, ear discharge, ear pain, hearing loss, nosebleeds, sinus pain and sore throat.   Eyes: Positive for blurred vision  (occasional). Negative for double vision, photophobia and pain.       Eye sight worsening secondary to poor kidney function.  Respiratory: Negative.  Negative for cough, hemoptysis, sputum production, shortness of breath and wheezing.   Cardiovascular: Positive for leg swelling. Negative for chest pain, palpitations, orthopnea and PND.  Gastrointestinal: Negative for abdominal pain, blood in stool, constipation, diarrhea, heartburn (resolved), melena, nausea and vomiting.  Genitourinary: Negative for dysuria, frequency, hematuria and urgency.       Has a UTI  Musculoskeletal: Positive for joint pain (bilateral knees). Negative for back pain, falls  and myalgias.  Skin: Negative.  Negative for rash.  Neurological: Negative.  Negative for dizziness, tremors, speech change, focal weakness, seizures, weakness and headaches.  Endo/Heme/Allergies: Negative.  Does not bruise/bleed easily.  Psychiatric/Behavioral: Negative.  Negative for depression, hallucinations, memory loss and suicidal ideas. The patient is not nervous/anxious and does not have insomnia.   All other systems reviewed and are negative.  Performance status (ECOG): 1  Vitals Blood pressure (!) 106/91, pulse 94, temperature (!) 96.7 F (35.9 C), temperature source Tympanic, resp. rate (!) 22, SpO2 96 %.   Physical Exam Vitals and nursing note reviewed.  Constitutional:      General: He is not in acute distress.    Appearance: He is well-developed. He is not diaphoretic.  HENT:     Head: Normocephalic and atraumatic.     Comments: Gray hair.    Mouth/Throat:     Pharynx: No oropharyngeal exudate.  Eyes:     General: No scleral icterus.    Conjunctiva/sclera: Conjunctivae normal.     Pupils: Pupils are equal, round, and reactive to light.     Comments: Glasses.  Neck:     Vascular: No JVD.  Cardiovascular:     Rate and Rhythm: Normal rate and regular rhythm.     Heart sounds: Normal heart sounds. No murmur heard.    Pulmonary:     Effort: Pulmonary effort is normal. No respiratory distress.     Breath sounds: Normal breath sounds. No wheezing or rales.  Abdominal:     General: Bowel sounds are normal. There is no distension.     Palpations: Abdomen is soft. There is no mass.     Tenderness: There is no abdominal tenderness. There is no guarding or rebound.  Musculoskeletal:        General: Normal range of motion.     Cervical back: Normal range of motion and neck supple.     Right lower leg: Edema (2+, R>L) present.     Left lower leg: Edema present.  Lymphadenopathy:     Head:     Right side of head: No preauricular, posterior auricular or occipital adenopathy.     Left side of head: No preauricular, posterior auricular or occipital adenopathy.     Cervical: No cervical adenopathy.     Upper Body:     Right upper body: No supraclavicular adenopathy.     Left upper body: No supraclavicular adenopathy.  Skin:    General: Skin is warm and dry.     Coloration: Skin is not pale.     Findings: No erythema or rash.  Neurological:     Mental Status: He is alert and oriented to person, place, and time.  Psychiatric:        Behavior: Behavior normal.        Thought Content: Thought content normal.        Judgment: Judgment normal.     Orders Only on 12/22/2019  Component Date Value Ref Range Status  . Retic Ct Pct 12/22/2019 2.7  0.4 - 3.1 % Final  . RBC. 12/22/2019 2.96* 4.22 - 5.81 MIL/uL Final  . Retic Count, Absolute 12/22/2019 79.0  19.0 - 186.0 K/uL Final  . Immature Retic Fract 12/22/2019 25.8* 2.3 - 15.9 % Final   Performed at [facility], [address] [place],  [address]  Appointment on 12/22/2019  Component Date Value Ref Range Status  . TSH 12/22/2019 2.157  0.350 - 4.500 uIU/mL Final   Comment:  Performed by a 3rd Generation assay with a functional sensitivity of <=0.01 uIU/mL. Performed at [facility], [address] [place], [place] [address]    . Iron 12/22/2019 30* 45 - 182 ug/dL Final  . TIBC 12/22/2019 326  250 - 450 ug/dL Final  . Saturation Ratios 12/22/2019 9* 17.9 - 39.5 % Final  . UIBC 12/22/2019 296  ug/dL Final   Performed at [facility], [address] [place], [place] [address]  . Ferritin 12/22/2019 33  24 - 336 ng/mL Final   Performed at [facility], [address] [place], [place] [address]  . WBC 12/22/2019 11.0* 4.0 - 10.5 K/uL Final  . RBC 12/22/2019 2.95* 4.22 - 5.81 MIL/uL Final  . Hemoglobin 12/22/2019 8.4* 13.0 - 17.0 g/dL Final  . HCT 12/22/2019 27.1* 39 - 52 % Final  . MCV 12/22/2019 91.9  80.0 - 100.0 fL Final  . MCH 12/22/2019 28.5  26.0 - 34.0 pg Final  . MCHC 12/22/2019 31.0  30.0 - 36.0 g/dL Final  . RDW 12/22/2019 16.6* 11.5 - 15.5 % Final  . Platelets 12/22/2019 343  150 - 400 K/uL Final  . nRBC 12/22/2019 0.0  0.0 - 0.2 % Final  . Neutrophils Relative % 12/22/2019 79  % Final  . Neutro Abs 12/22/2019 8.8* 1.7 - 7.7 K/uL Final  . Lymphocytes Relative 12/22/2019 15  % Final  . Lymphs Abs 12/22/2019 1.6  0.7 - 4.0 K/uL Final  . Monocytes Relative 12/22/2019 5  % Final  . Monocytes Absolute 12/22/2019 0.5  0 - 1 K/uL Final  . Eosinophils Relative 12/22/2019 0  % Final  . Eosinophils Absolute 12/22/2019 0.0  0 - 0 K/uL Final  . Basophils Relative 12/22/2019 0  % Final  . Basophils Absolute 12/22/2019 0.0  0 - 0 K/uL Final  . Immature Granulocytes 12/22/2019 1  % Final  . Abs Immature Granulocytes 12/22/2019 0.07  0.00 - 0.07 K/uL Final   Performed at [facility], [address], [place], [place] [address]    Assessment:  ELWIN TSOU is a 79 y.o. male withanemia of chronic renal disease and B12 deficiency. He has focal segmental glomerulosclerosis (FSGS). Due to worsening kidney function, Cellcept wasdiscontinued.   He received Procrit50,000 units monthly through 02/20/2018. He beganRetacriton 08/08/2018 (50,000 units). In 03/020, hemoglobin remained  low and frequency was increased to every 14 days.  He has received Retacrit 40,000 units every 4 weeks since 11/25/2018 (last 06/09/2019).   Hemoglobinhas been followed. 9.0 on 08/14/2018,7.8on 09/05/2018),8.3on 09/19/2018, 9.6 on 11/06/2018, 10.3 on 12/23/2018, 9.9 on 01/20/2019, 10.1 on 02/17/2019, 9.1 on 03/17/2019, 9.9 on 04/14/2019, 10.0 on 05/12/2019, 10.7 on 06/09/2019, and 10.9 on 07/07/2019.  He has B12 deficiencyand continues monthly home b12 injections. Last B12 level was629on02/20/2020. Intrinsic factor antibody and antiparietal antibodywerenegative on 08/06/2018.   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 Margins were not clear at that time and was reexcised by dermatology on04/16/2016. He isfollowed bydermatology.  He has a left renal masss/pCT-guided biopsy on 09/15/2012. Pathologywas negative. Ultrasound of kidneys on 11/18/2017 revealed a stable 5.1 cm left kidney mass. He is followed by Dr. Erlene Quan.  Abdomen and pelvis CTon 10/10/2018 revealed cholelithiasis without inflammation.There was no hydronephrosis,renal obstruction,renal or ureteral calculi.There was a stable 4.8 cm complex and peripherally calcified exophytic lesion arising from midpole of left kidney mostc/wbenign complex or proteinaceous cyst. There was mild prostatic enlargement.  [facility] for symptomatic bradycardiathought to be due to beta-blocker and symptomatic anemia. Hemoglobin was 6.4 and he received 2 units of PRBCs. Work-up revealed ferritin 247, iron saturation 33%, folate normal. B12 was 311 (previously 6177 on 10/24/2017)and he began B12injections.  He was again admitted [facility] on02/19/2020 -02/20/2020for anemia. Hemoglobin was 5.8. Ferritin was 350. B12was 629. Folate was 7.5. Received 3 units of PRBCs.  He has never had a  colonoscopy.  The patient received the [place] COVID-19 vaccine on 08/08/2019 and 09/01/2019.  Symptomatically, he notes difficulty walking secondary to achy knees and leg swelling.  He recently completed antibiotics for a UTI.  He denies any bleeding.   Plan: 1.   Labs today: CBC with diff, retic, ferritin, iron studies, TSH. 2.Anemia of chronic kidney disease Hematocrit 27.1. Hemoglobin8.4. MCV91.9.  Ferritin 33 with an iron saturation of 9% and a TIBC 326 (available after clinic).    Retic is 2.7%. TSH is normal. Hemoglobingoal is>=10 (Retacrit if Hgb < 11). Patient declines transfusion today. Retacrit today. 3.Stage III chronic kidney disease BUN46and creatinine2.30on 01/20/2019. BUN 56 and creatinine 3.52 on 09/28/2019. BUN 68 and creatinine 3.50 on 11/18/2019. Patient followed by Dr Holley Raring. 4.B12 deficiency Patient receivesmonthly B12 at home. B12 was 1001 on 11/06/2018. Folate was 12.4 on 09/28/2019. Check folate annually. 5.   RTC in 2 weeks for labs (HCT/Hgb, hold tube) and +/- Retacrit. 6.   RTC in 4 weeks for MD assessment, labs (CBC with diff, hold tube) and +/- Retacrit.  Addendum:  Patient contacted after clinic regarding iron deficiency.  Recommendation to begin taking oral iron with OJ or vitamin C.  I discussed the assessment and treatment plan with the patient.  The patient was provided an opportunity to ask questions and all were answered.  The patient agreed with the plan and demonstrated an understanding of the instructions.  The patient was advised to call back if the symptoms worsen or if the condition fails to improve as anticipated.   Lequita Asal, MD, PhD    12/22/2019, 11:25 AM  I, Mirian Mo Tufford, am acting as Education administrator for [employer]. Mike Gip, MD, PhD.  I, Laini Urick C. Mike Gip, MD, have reviewed the above documentation for accuracy and completeness, and I agree with the above.

## 2019-12-22 ENCOUNTER — Inpatient Hospital Stay: Payer: Medicare Other

## 2019-12-22 ENCOUNTER — Other Ambulatory Visit: Payer: Self-pay | Admitting: Hematology and Oncology

## 2019-12-22 ENCOUNTER — Other Ambulatory Visit: Payer: Self-pay

## 2019-12-22 ENCOUNTER — Encounter: Payer: Self-pay | Admitting: Hematology and Oncology

## 2019-12-22 ENCOUNTER — Inpatient Hospital Stay: Payer: Medicare Other | Attending: Hematology and Oncology | Admitting: Hematology and Oncology

## 2019-12-22 VITALS — BP 124/82 | HR 90 | Resp 20

## 2019-12-22 VITALS — BP 106/91 | HR 94 | Temp 96.7°F | Resp 22

## 2019-12-22 DIAGNOSIS — D649 Anemia, unspecified: Secondary | ICD-10-CM

## 2019-12-22 DIAGNOSIS — I5033 Acute on chronic diastolic (congestive) heart failure: Secondary | ICD-10-CM | POA: Diagnosis not present

## 2019-12-22 DIAGNOSIS — Z951 Presence of aortocoronary bypass graft: Secondary | ICD-10-CM | POA: Diagnosis not present

## 2019-12-22 DIAGNOSIS — K219 Gastro-esophageal reflux disease without esophagitis: Secondary | ICD-10-CM | POA: Diagnosis not present

## 2019-12-22 DIAGNOSIS — N184 Chronic kidney disease, stage 4 (severe): Secondary | ICD-10-CM

## 2019-12-22 DIAGNOSIS — Z79899 Other long term (current) drug therapy: Secondary | ICD-10-CM | POA: Diagnosis not present

## 2019-12-22 DIAGNOSIS — E538 Deficiency of other specified B group vitamins: Secondary | ICD-10-CM

## 2019-12-22 DIAGNOSIS — I2510 Atherosclerotic heart disease of native coronary artery without angina pectoris: Secondary | ICD-10-CM | POA: Diagnosis not present

## 2019-12-22 DIAGNOSIS — Z882 Allergy status to sulfonamides status: Secondary | ICD-10-CM | POA: Diagnosis not present

## 2019-12-22 DIAGNOSIS — D631 Anemia in chronic kidney disease: Secondary | ICD-10-CM

## 2019-12-22 DIAGNOSIS — Z807 Family history of other malignant neoplasms of lymphoid, hematopoietic and related tissues: Secondary | ICD-10-CM | POA: Diagnosis not present

## 2019-12-22 DIAGNOSIS — J9601 Acute respiratory failure with hypoxia: Secondary | ICD-10-CM | POA: Diagnosis not present

## 2019-12-22 DIAGNOSIS — N186 End stage renal disease: Secondary | ICD-10-CM | POA: Diagnosis not present

## 2019-12-22 DIAGNOSIS — E8770 Fluid overload, unspecified: Secondary | ICD-10-CM | POA: Diagnosis not present

## 2019-12-22 DIAGNOSIS — N471 Phimosis: Secondary | ICD-10-CM | POA: Diagnosis not present

## 2019-12-22 DIAGNOSIS — N2889 Other specified disorders of kidney and ureter: Secondary | ICD-10-CM | POA: Diagnosis not present

## 2019-12-22 DIAGNOSIS — I132 Hypertensive heart and chronic kidney disease with heart failure and with stage 5 chronic kidney disease, or end stage renal disease: Secondary | ICD-10-CM | POA: Diagnosis not present

## 2019-12-22 DIAGNOSIS — I252 Old myocardial infarction: Secondary | ICD-10-CM | POA: Diagnosis not present

## 2019-12-22 DIAGNOSIS — Z7982 Long term (current) use of aspirin: Secondary | ICD-10-CM | POA: Diagnosis not present

## 2019-12-22 DIAGNOSIS — Z880 Allergy status to penicillin: Secondary | ICD-10-CM | POA: Diagnosis not present

## 2019-12-22 DIAGNOSIS — Z801 Family history of malignant neoplasm of trachea, bronchus and lung: Secondary | ICD-10-CM | POA: Diagnosis not present

## 2019-12-22 DIAGNOSIS — E785 Hyperlipidemia, unspecified: Secondary | ICD-10-CM | POA: Diagnosis not present

## 2019-12-22 DIAGNOSIS — N2581 Secondary hyperparathyroidism of renal origin: Secondary | ICD-10-CM | POA: Diagnosis not present

## 2019-12-22 DIAGNOSIS — Z7952 Long term (current) use of systemic steroids: Secondary | ICD-10-CM | POA: Diagnosis not present

## 2019-12-22 LAB — CBC WITH DIFFERENTIAL/PLATELET
Abs Immature Granulocytes: 0.07 10*3/uL (ref 0.00–0.07)
Basophils Absolute: 0 10*3/uL (ref 0.0–0.1)
Basophils Relative: 0 %
Eosinophils Absolute: 0 10*3/uL (ref 0.0–0.5)
Eosinophils Relative: 0 %
HCT: 27.1 % — ABNORMAL LOW (ref 39.0–52.0)
Hemoglobin: 8.4 g/dL — ABNORMAL LOW (ref 13.0–17.0)
Immature Granulocytes: 1 %
Lymphocytes Relative: 15 %
Lymphs Abs: 1.6 10*3/uL (ref 0.7–4.0)
MCH: 28.5 pg (ref 26.0–34.0)
MCHC: 31 g/dL (ref 30.0–36.0)
MCV: 91.9 fL (ref 80.0–100.0)
Monocytes Absolute: 0.5 10*3/uL (ref 0.1–1.0)
Monocytes Relative: 5 %
Neutro Abs: 8.8 10*3/uL — ABNORMAL HIGH (ref 1.7–7.7)
Neutrophils Relative %: 79 %
Platelets: 343 10*3/uL (ref 150–400)
RBC: 2.95 MIL/uL — ABNORMAL LOW (ref 4.22–5.81)
RDW: 16.6 % — ABNORMAL HIGH (ref 11.5–15.5)
WBC: 11 10*3/uL — ABNORMAL HIGH (ref 4.0–10.5)
nRBC: 0 % (ref 0.0–0.2)

## 2019-12-22 LAB — RETICULOCYTES
Immature Retic Fract: 25.8 % — ABNORMAL HIGH (ref 2.3–15.9)
RBC.: 2.96 MIL/uL — ABNORMAL LOW (ref 4.22–5.81)
Retic Count, Absolute: 79 10*3/uL (ref 19.0–186.0)
Retic Ct Pct: 2.7 % (ref 0.4–3.1)

## 2019-12-22 LAB — FERRITIN: Ferritin: 33 ng/mL (ref 24–336)

## 2019-12-22 LAB — IRON AND TIBC
Iron: 30 ug/dL — ABNORMAL LOW (ref 45–182)
Saturation Ratios: 9 % — ABNORMAL LOW (ref 17.9–39.5)
TIBC: 326 ug/dL (ref 250–450)
UIBC: 296 ug/dL

## 2019-12-22 LAB — TSH: TSH: 2.157 u[IU]/mL (ref 0.350–4.500)

## 2019-12-22 MED ORDER — EPOETIN ALFA-EPBX 40000 UNIT/ML IJ SOLN
40000.0000 [IU] | Freq: Once | INTRAMUSCULAR | Status: AC
  Administered 2019-12-22: 40000 [IU] via SUBCUTANEOUS
  Filled 2019-12-22: qty 1

## 2019-12-22 NOTE — Progress Notes (Signed)
Patient here for oncology follow-up appointment, expresses complaints of leg swelling and ocassional chest pain.

## 2019-12-23 ENCOUNTER — Telehealth: Payer: Self-pay

## 2019-12-23 NOTE — Telephone Encounter (Signed)
No answer / Unable to leave a message i have reached out to the patient to inform him that he is Iron def. Per Dr Mike Gip would like for him to start oral Iron 325 1 tab po BID with vitamin C or OJ.

## 2019-12-24 ENCOUNTER — Encounter: Payer: Self-pay | Admitting: Internal Medicine

## 2019-12-24 ENCOUNTER — Inpatient Hospital Stay: Payer: Medicare Other

## 2019-12-24 ENCOUNTER — Inpatient Hospital Stay
Admission: AD | Admit: 2019-12-24 | Discharge: 2019-12-29 | DRG: 291 | Disposition: A | Discharge status: Home / Self Care | Payer: Medicare Other | Source: Ambulatory Visit | Attending: Internal Medicine | Admitting: Internal Medicine

## 2019-12-24 ENCOUNTER — Other Ambulatory Visit: Payer: Self-pay

## 2019-12-24 DIAGNOSIS — I252 Old myocardial infarction: Secondary | ICD-10-CM

## 2019-12-24 DIAGNOSIS — K219 Gastro-esophageal reflux disease without esophagitis: Secondary | ICD-10-CM | POA: Diagnosis present

## 2019-12-24 DIAGNOSIS — N185 Chronic kidney disease, stage 5: Secondary | ICD-10-CM | POA: Diagnosis not present

## 2019-12-24 DIAGNOSIS — I2510 Atherosclerotic heart disease of native coronary artery without angina pectoris: Secondary | ICD-10-CM | POA: Diagnosis not present

## 2019-12-24 DIAGNOSIS — Z7952 Long term (current) use of systemic steroids: Secondary | ICD-10-CM

## 2019-12-24 DIAGNOSIS — Z79899 Other long term (current) drug therapy: Secondary | ICD-10-CM

## 2019-12-24 DIAGNOSIS — N184 Chronic kidney disease, stage 4 (severe): Secondary | ICD-10-CM

## 2019-12-24 DIAGNOSIS — Z951 Presence of aortocoronary bypass graft: Secondary | ICD-10-CM | POA: Diagnosis not present

## 2019-12-24 DIAGNOSIS — R0602 Shortness of breath: Secondary | ICD-10-CM | POA: Diagnosis not present

## 2019-12-24 DIAGNOSIS — E785 Hyperlipidemia, unspecified: Secondary | ICD-10-CM | POA: Diagnosis present

## 2019-12-24 DIAGNOSIS — I129 Hypertensive chronic kidney disease with stage 1 through stage 4 chronic kidney disease, or unspecified chronic kidney disease: Secondary | ICD-10-CM

## 2019-12-24 DIAGNOSIS — N051 Unspecified nephritic syndrome with focal and segmental glomerular lesions: Secondary | ICD-10-CM

## 2019-12-24 DIAGNOSIS — N269 Renal sclerosis, unspecified: Secondary | ICD-10-CM | POA: Diagnosis present

## 2019-12-24 DIAGNOSIS — Z882 Allergy status to sulfonamides status: Secondary | ICD-10-CM | POA: Diagnosis not present

## 2019-12-24 DIAGNOSIS — Z807 Family history of other malignant neoplasms of lymphoid, hematopoietic and related tissues: Secondary | ICD-10-CM

## 2019-12-24 DIAGNOSIS — E538 Deficiency of other specified B group vitamins: Secondary | ICD-10-CM | POA: Diagnosis not present

## 2019-12-24 DIAGNOSIS — R972 Elevated prostate specific antigen [PSA]: Secondary | ICD-10-CM | POA: Diagnosis present

## 2019-12-24 DIAGNOSIS — R630 Anorexia: Secondary | ICD-10-CM | POA: Diagnosis present

## 2019-12-24 DIAGNOSIS — N471 Phimosis: Secondary | ICD-10-CM | POA: Diagnosis present

## 2019-12-24 DIAGNOSIS — N400 Enlarged prostate without lower urinary tract symptoms: Secondary | ICD-10-CM | POA: Diagnosis present

## 2019-12-24 DIAGNOSIS — J9601 Acute respiratory failure with hypoxia: Secondary | ICD-10-CM | POA: Diagnosis present

## 2019-12-24 DIAGNOSIS — Z803 Family history of malignant neoplasm of breast: Secondary | ICD-10-CM

## 2019-12-24 DIAGNOSIS — I517 Cardiomegaly: Secondary | ICD-10-CM | POA: Diagnosis not present

## 2019-12-24 DIAGNOSIS — I10 Essential (primary) hypertension: Secondary | ICD-10-CM | POA: Diagnosis present

## 2019-12-24 DIAGNOSIS — N041 Nephrotic syndrome with focal and segmental glomerular lesions: Secondary | ICD-10-CM | POA: Diagnosis not present

## 2019-12-24 DIAGNOSIS — Z801 Family history of malignant neoplasm of trachea, bronchus and lung: Secondary | ICD-10-CM | POA: Diagnosis not present

## 2019-12-24 DIAGNOSIS — Z7982 Long term (current) use of aspirin: Secondary | ICD-10-CM

## 2019-12-24 DIAGNOSIS — E8770 Fluid overload, unspecified: Secondary | ICD-10-CM | POA: Diagnosis not present

## 2019-12-24 DIAGNOSIS — Z20822 Contact with and (suspected) exposure to covid-19: Secondary | ICD-10-CM | POA: Diagnosis present

## 2019-12-24 DIAGNOSIS — D631 Anemia in chronic kidney disease: Secondary | ICD-10-CM | POA: Diagnosis not present

## 2019-12-24 DIAGNOSIS — N2889 Other specified disorders of kidney and ureter: Secondary | ICD-10-CM | POA: Diagnosis not present

## 2019-12-24 DIAGNOSIS — N186 End stage renal disease: Secondary | ICD-10-CM | POA: Diagnosis not present

## 2019-12-24 DIAGNOSIS — Z8249 Family history of ischemic heart disease and other diseases of the circulatory system: Secondary | ICD-10-CM

## 2019-12-24 DIAGNOSIS — J90 Pleural effusion, not elsewhere classified: Secondary | ICD-10-CM | POA: Diagnosis not present

## 2019-12-24 DIAGNOSIS — N2581 Secondary hyperparathyroidism of renal origin: Secondary | ICD-10-CM | POA: Diagnosis not present

## 2019-12-24 DIAGNOSIS — I5033 Acute on chronic diastolic (congestive) heart failure: Secondary | ICD-10-CM | POA: Diagnosis present

## 2019-12-24 DIAGNOSIS — I25119 Atherosclerotic heart disease of native coronary artery with unspecified angina pectoris: Secondary | ICD-10-CM | POA: Diagnosis not present

## 2019-12-24 DIAGNOSIS — I132 Hypertensive heart and chronic kidney disease with heart failure and with stage 5 chronic kidney disease, or end stage renal disease: Secondary | ICD-10-CM | POA: Diagnosis not present

## 2019-12-24 DIAGNOSIS — Z992 Dependence on renal dialysis: Secondary | ICD-10-CM

## 2019-12-24 DIAGNOSIS — Z880 Allergy status to penicillin: Secondary | ICD-10-CM | POA: Diagnosis not present

## 2019-12-24 DIAGNOSIS — Z6824 Body mass index (BMI) 24.0-24.9, adult: Secondary | ICD-10-CM

## 2019-12-24 LAB — CBC
HCT: 27.3 % — ABNORMAL LOW (ref 39.0–52.0)
Hemoglobin: 8.6 g/dL — ABNORMAL LOW (ref 13.0–17.0)
MCH: 28.8 pg (ref 26.0–34.0)
MCHC: 31.5 g/dL (ref 30.0–36.0)
MCV: 91.3 fL (ref 80.0–100.0)
Platelets: 335 10*3/uL (ref 150–400)
RBC: 2.99 MIL/uL — ABNORMAL LOW (ref 4.22–5.81)
RDW: 16.4 % — ABNORMAL HIGH (ref 11.5–15.5)
WBC: 8.7 10*3/uL (ref 4.0–10.5)
nRBC: 1.4 % — ABNORMAL HIGH (ref 0.0–0.2)

## 2019-12-24 LAB — BASIC METABOLIC PANEL
Anion gap: 12 (ref 5–15)
BUN: 93 mg/dL — ABNORMAL HIGH (ref 8–23)
CO2: 21 mmol/L — ABNORMAL LOW (ref 22–32)
Calcium: 8.6 mg/dL — ABNORMAL LOW (ref 8.9–10.3)
Chloride: 114 mmol/L — ABNORMAL HIGH (ref 98–111)
Creatinine, Ser: 5.74 mg/dL — ABNORMAL HIGH (ref 0.61–1.24)
GFR calc Af Amer: 10 mL/min — ABNORMAL LOW (ref >60)
GFR calc non Af Amer: 8 mL/min — ABNORMAL LOW (ref >60)
Glucose, Bld: 147 mg/dL — ABNORMAL HIGH (ref 70–99)
Potassium: 4.2 mmol/L (ref 3.5–5.1)
Sodium: 147 mmol/L — ABNORMAL HIGH (ref 135–145)

## 2019-12-24 LAB — BRAIN NATRIURETIC PEPTIDE: B Natriuretic Peptide: 2021.2 pg/mL — ABNORMAL HIGH (ref 0.0–100.0)

## 2019-12-24 LAB — SARS CORONAVIRUS 2 BY RT PCR (HOSPITAL ORDER, PERFORMED IN ~~LOC~~ HOSPITAL LAB): SARS Coronavirus 2: NEGATIVE

## 2019-12-24 MED ORDER — LEVOFLOXACIN 250 MG PO TABS: 250.0000 mg | ORAL_TABLET | ORAL | Status: DC

## 2019-12-24 MED ORDER — HYDRALAZINE HCL 50 MG PO TABS: 50.0000 mg | ORAL_TABLET | Freq: Three times a day (TID) | ORAL | Status: DC

## 2019-12-24 MED ORDER — PATIROMER SORBITEX CALCIUM 8.4 G PO PACK
8.4000 g | PACK | Freq: Every day | ORAL | Status: DC
  Filled 2019-12-24: qty 1

## 2019-12-24 MED ORDER — HEPARIN SODIUM (PORCINE) 5000 UNIT/ML IJ SOLN
5000.0000 [IU] | Freq: Three times a day (TID) | INTRAMUSCULAR | Status: DC
  Administered 2019-12-24 – 2019-12-29 (×10): 5000 [IU] via SUBCUTANEOUS
  Filled 2019-12-24 (×10): qty 1

## 2019-12-24 MED ORDER — SODIUM BICARBONATE 650 MG PO TABS
650.0000 mg | ORAL_TABLET | Freq: Two times a day (BID) | ORAL | Status: DC
  Administered 2019-12-24: 650 mg via ORAL
  Filled 2019-12-24 (×2): qty 1

## 2019-12-24 MED ORDER — ATORVASTATIN CALCIUM 20 MG PO TABS
40.0000 mg | ORAL_TABLET | Freq: Every day | ORAL | Status: DC
  Administered 2019-12-25 – 2019-12-29 (×5): 40 mg via ORAL
  Filled 2019-12-24 (×5): qty 2

## 2019-12-24 MED ORDER — FINASTERIDE 5 MG PO TABS
5.0000 mg | ORAL_TABLET | Freq: Every day | ORAL | Status: DC
  Administered 2019-12-25 – 2019-12-29 (×5): 5 mg via ORAL
  Filled 2019-12-24 (×5): qty 1

## 2019-12-24 MED ORDER — AMLODIPINE BESYLATE 10 MG PO TABS: 10.0000 mg | ORAL_TABLET | Freq: Every day | ORAL | Status: DC

## 2019-12-24 MED ORDER — HYDRALAZINE HCL 25 MG PO TABS: 25.0000 mg | ORAL_TABLET | Freq: Three times a day (TID) | ORAL | Status: DC

## 2019-12-24 MED ORDER — VITAMIN B-12 1000 MCG PO TABS
1000.0000 ug | ORAL_TABLET | Freq: Every day | ORAL | Status: DC
  Administered 2019-12-25 – 2019-12-29 (×5): 1000 ug via ORAL
  Filled 2019-12-24 (×5): qty 1

## 2019-12-24 NOTE — H&P (Signed)
History and Physical        Hospital Admission Note Date: 12/24/2019  Patient name: Arthur Bowen Medical record number: [id number] Date of birth: Jun 03, 1941 Age: 79 y.o. Gender: male  PCP: Glean Hess, MD    Patient coming from: Nephrology Office   I have reviewed all records in the [facility].    Chief Complaint:  SOB   HPI: Arthur Bowen is a 79 y.o. 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 Was seen with worsening respiratory status with LE edema and rales. Was directed to come to [facility]. Patient reports he has been SOB progressively for almost two weeks. Has had significant LE edema. Afebrile. No known COVID contacts.    ED work-up/course: N/A   Review of Systems: Positives marked in 'bold' Constitutional: Denies fever, chills, diaphoresis, poor appetite and fatigue.  HEENT: Denies photophobia, eye pain, redness, hearing loss, ear pain, congestion, sore throat, rhinorrhea, sneezing, mouth sores, trouble swallowing, neck pain, neck stiffness and tinnitus.   Respiratory: Denies SOB, DOE, cough, chest tightness,  and wheezing.   Cardiovascular: Denies chest pain, palpitations and leg swelling.  Gastrointestinal: Denies nausea, vomiting, abdominal pain, diarrhea, constipation, blood in stool and abdominal distention.  Genitourinary: Denies dysuria, urgency, frequency, hematuria, flank pain and difficulty urinating.  Musculoskeletal: Denies myalgias, back pain, joint swelling, arthralgias and gait problem.  Skin: Denies pallor, rash and wound.  Neurological: Denies dizziness, seizures, syncope, weakness, light-headedness, numbness and headaches.  Hematological: Denies adenopathy. Easy bruising, personal or family bleeding history   Psychiatric/Behavioral: Denies suicidal ideation, mood changes, confusion, nervousness, sleep disturbance and agitation  Past Medical History: Past Medical History:  Diagnosis Date  . Acquired phimosis   . BPH (benign prostatic hypertrophy)   . Chronic kidney disease   . Dyspnea    with exertion  . Elevated PSA   . GERD (gastroesophageal reflux disease)   . Gross hematuria   . Hematuria   . History of hiatal hernia   . Hypertension   . 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 Renal mass, left   . Symptomatic anemia 11/21/2017    Past Surgical History:  Procedure Laterality Date  . AV FISTULA PLACEMENT Left 11/18/2019   Procedure: ARTERIOVENOUS (AV) FISTULA CREATION (BRACHIAL CEPHALIC);  Surgeon: Katha Cabal, MD;  Location: [facility] ORS;  Service: Vascular;  Laterality: Left;  . Circumscision  09/2015  . CORONARY ARTERY BYPASS GRAFT  2002   x3 @ [place]  . INGUINAL HERNIA REPAIR Left 2011  . RENAL BIOPSY  2014   neg in w/u renal mass/hematuria    Medications: Prior to Admission medications   Medication Sig Start Date End Date Taking? Authorizing Provider  amLODipine (NORVASC) 10 MG tablet Take 10 mg by mouth daily. 08/13/19   [provider]  aspirin 81 MG tablet Take 1 tablet by mouth daily. Reported on 09/02/2015    [provider]  atorvastatin (LIPITOR) 40 MG tablet Take 1 tablet by mouth once daily  08/13/19   Glean Hess, MD  cyanocobalamin (,VITAMIN B-12,) 1000 MCG/ML injection SMARTSIG:1000 MCG IM 11/27/19   [provider]  Cyanocobalamin (B-12) 1000 MCG/ML KIT Inject 1,000 mcg as directed every 30 (thirty) days. 04/14/19   Lequita Asal, MD  Cyanocobalamin (VITAMIN B 12 PO) Take 1,000 mcg by mouth daily.    [provider]  famotidine (PEPCID) 20 MG tablet Take 1 tablet (20 mg total) by mouth daily. 08/19/19   Glean Hess, MD  famotidine (PEPCID) 20 MG tablet Take 1 tablet by mouth daily. 08/19/19   [provider]  finasteride (PROSCAR) 5 MG tablet Take 1 tablet (5 mg total) by mouth daily. 08/19/19   Glean Hess, MD  hydrALAZINE (APRESOLINE) 25 MG tablet Take 25 mg by mouth 3 (three) times daily. 09/12/19   [provider]  hydrALAZINE (APRESOLINE) 50 MG tablet Take 50 mg by mouth 3 (three) times daily.  03/01/19   [provider]  HYDROcodone-acetaminophen (NORCO) 5-325 MG tablet Take 1-2 tablets by mouth every 6 (six) hours as needed. 11/18/19   Schnier, Dolores Lory, MD  levofloxacin (LEVAQUIN) 250 MG tablet Take 250 mg by mouth every other day. 12/17/19   [provider]  lisinopril (ZESTRIL) 20 MG tablet Take 1 tablet by mouth once daily 08/13/19   Glean Hess, MD  nitroGLYCERIN (NITROSTAT) 0.4 MG SL tablet PLACE 1 TABLET UNDER THE TONGUE EVERY 5 MINUTES AS NEEDED FOR CHEST PAIN Patient not taking: Reported on 12/22/2019 11/28/19   Glean Hess, MD  patiromer [facility]) 8.4 g packet Take 8.4 g by mouth daily.    [provider]  patiromer Daryll Drown) 8.4 g packet  10/19/19   [provider]  predniSONE (DELTASONE) 10 MG tablet Take 10 mg by mouth daily with breakfast.     [provider]  sodium bicarbonate 650 MG tablet Take by mouth. 12/03/19 01/02/20  [provider]    Allergies:   Allergies  Allergen Reactions  . Penicillins Hives  . Sulfa Antibiotics Hives    Social History:  reports that he quit smoking about 2 years ago. His smoking use included cigarettes. He has a 25.00 pack-year smoking history. He has quit using smokeless tobacco.  His smokeless tobacco use included chew. He reports that he does not drink alcohol and does not use drugs.  Family History: Family History  Problem Relation Age of Onset  . Lymphoma Mother   . Hodgkin's lymphoma Mother   . Lung cancer Father   . Heart disease Brother   . Dementia Sister   . Dementia Brother   . Breast cancer Maternal Aunt     Physical Exam: Blood  pressure 126/65, pulse 93, temperature (!) 97.5 F (36.4 C), temperature source Oral, resp. rate 18, height 6' (1.829 m), weight 89.3 kg, SpO2 98 %. General: Alert, awake, oriented x3, in no acute distress. Eyes: pink conjunctiva,anicteric sclera, pupils equal and reactive to light and accomodation, HEENT: normocephalic, atraumatic, oropharynx clear Neck: supple, no masses or lymphadenopathy, no goiter, no bruits, no JVD CVS: Regular rate and rhythm, without murmurs, rubs or gallops. 3+ pitting edema  Resp : Increased WOB. SOB when speaking in full sentences. Crackles bilaterally. GI : Soft, nontender, nondistended, positive bowel sounds, no masses. No hepatomegaly. No hernia.  Musculoskeletal: No clubbing or cyanosis, positive pedal pulses. No contracture. ROM intact  Neuro: Grossly intact, no focal neurological deficits, strength 5/5 upper and lower extremities bilaterally Psych: alert and oriented  x 3, normal mood and affect Skin: no rashes or lesions, warm and dry   LABS on Admission: I have personally reviewed all the labs and imagings below    Basic Metabolic Panel: No results for input(s): NA, K, CL, CO2, GLUCOSE, BUN, CREATININE, CALCIUM, MG, PHOS in the last 168 hours. Liver Function Tests: No results for input(s): AST, ALT, ALKPHOS, BILITOT, PROT, ALBUMIN in the last 168 hours. No results for input(s): LIPASE, AMYLASE in the last 168 hours. No results for input(s): AMMONIA in the last 168 hours. CBC: Recent Labs  Lab 12/22/19 1027  WBC 11.0*  NEUTROABS 8.8*  HGB 8.4*  HCT 27.1*  MCV 91.9  PLT 343   Cardiac Enzymes: No results for input(s): CKTOTAL, CKMB, CKMBINDEX, TROPONINI in the last 168 hours. BNP: Invalid input(s): POCBNP CBG: No results for input(s): GLUCAP in the last 168 hours.  Radiological Exams on Admission:  No results found.   Assessment/Plan Active Problems:   Coronary artery disease involving native coronary artery of native heart with angina  pectoris (HCC)   Dyslipidemia   Essential (primary) hypertension   Focal segmental glomerulosclerosis   Renal mass   CKD stage 4 secondary to hypertension ([place])   Secondary hyperparathyroidism of renal origin ([place])   Anemia due to stage 4 chronic kidney disease (HCC)   B12 deficiency   Shortness of breath  Dyspnea with LE Edema  Concern for volume overload related to advanced renal dysfunction. Needs further work up to drive treatment plan at admission.  -admit to [place], continuous cardiac and pulse ox monitoring  -nephrology will follow  -obtain BMET, per nephro recs if GFR >15 start with Lasix for diuresis, otherwise will need HD  -obtain CXR  -daily weights and strict I/Os  -obtain BNP   CKD Stage IV with FSG  -management per nephrology   Anemia 2/2 CKD  HgB 8.4 on 7/6. Followed by hematology.  -monitor CBC -transfusion threshold <8   HTN  BP normotensive.  -continue Amlodipine and Hydralazine  -hold Lisinopril   HLD  continue statin    DVT prophylaxis: Heparin   CODE STATUS: DNI   Consults called: Nephrology   Family Communication: Admission, patients condition and plan of care including tests being ordered have been discussed with the patient who indicates understanding and agree with the plan and Code Status  Admission status: Inpatient   The medical decision making on this patient was of high complexity and the patient is at high risk for clinical deterioration, therefore this is a level 3 admission.  Severity of Illness:     The appropriate patient status for this patient is INPATIENT. Inpatient status is judged to be reasonable and necessary in order to provide the required intensity of service to ensure the patient's safety. The patient's presenting symptoms, physical exam findings, and initial radiographic and laboratory data in the context of their chronic comorbidities is felt to place them at high risk for further clinical deterioration. Furthermore,  it is not anticipated that the patient will be medically stable for discharge from the hospital within 2 midnights of admission. The following factors support the patient status of inpatient.   " The patient's presenting symptoms include edema, tachypnea, dyspnea. " The worrisome physical exam findings include tachypnea, 3+ pitting edema, rales. " The initial radiographic and laboratory data are worrisome because of pending. 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   *  I certify that at the point of admission it is my clinical judgment that the patient will require inpatient hospital care spanning beyond 2 midnights from the point of admission due to high intensity of service, high risk for further deterioration and high frequency of surveillance required.*    Time Spent on Admission: 46 minutes      Melina Schools D.O.  [facility] 12/24/2019, 7:47 PM

## 2019-12-25 ENCOUNTER — Other Ambulatory Visit (INDEPENDENT_AMBULATORY_CARE_PROVIDER_SITE_OTHER): Payer: Self-pay | Admitting: Vascular Surgery

## 2019-12-25 ENCOUNTER — Encounter: Payer: Self-pay | Admitting: Internal Medicine

## 2019-12-25 ENCOUNTER — Inpatient Hospital Stay
Admission: AD | Disposition: A | Discharge status: Home / Self Care | Payer: Self-pay | Source: Ambulatory Visit | Attending: Internal Medicine

## 2019-12-25 ENCOUNTER — Telehealth: Payer: Self-pay

## 2019-12-25 DIAGNOSIS — N185 Chronic kidney disease, stage 5: Secondary | ICD-10-CM

## 2019-12-25 HISTORY — PX: DIALYSIS/PERMA CATHETER INSERTION: CATH118288

## 2019-12-25 LAB — BASIC METABOLIC PANEL
Anion gap: 12 (ref 5–15)
BUN: 93 mg/dL — ABNORMAL HIGH (ref 8–23)
CO2: 22 mmol/L (ref 22–32)
Calcium: 8.3 mg/dL — ABNORMAL LOW (ref 8.9–10.3)
Chloride: 111 mmol/L (ref 98–111)
Creatinine, Ser: 5.62 mg/dL — ABNORMAL HIGH (ref 0.61–1.24)
GFR calc Af Amer: 10 mL/min — ABNORMAL LOW (ref >60)
GFR calc non Af Amer: 9 mL/min — ABNORMAL LOW (ref >60)
Glucose, Bld: 112 mg/dL — ABNORMAL HIGH (ref 70–99)
Potassium: 4.1 mmol/L (ref 3.5–5.1)
Sodium: 145 mmol/L (ref 135–145)

## 2019-12-25 LAB — CBC
HCT: 27.6 % — ABNORMAL LOW (ref 39.0–52.0)
Hemoglobin: 8.3 g/dL — ABNORMAL LOW (ref 13.0–17.0)
MCH: 28.2 pg (ref 26.0–34.0)
MCHC: 30.1 g/dL (ref 30.0–36.0)
MCV: 93.9 fL (ref 80.0–100.0)
Platelets: 335 10*3/uL (ref 150–400)
RBC: 2.94 MIL/uL — ABNORMAL LOW (ref 4.22–5.81)
RDW: 16.5 % — ABNORMAL HIGH (ref 11.5–15.5)
WBC: 9 10*3/uL (ref 4.0–10.5)
nRBC: 1.6 % — ABNORMAL HIGH (ref 0.0–0.2)

## 2019-12-25 LAB — HEPATITIS C ANTIBODY: HCV Ab: NONREACTIVE

## 2019-12-25 LAB — HEPATITIS B CORE ANTIBODY, TOTAL: Hep B Core Total Ab: NONREACTIVE

## 2019-12-25 LAB — HEPATITIS B SURFACE ANTIBODY,QUALITATIVE: Hep B S Ab: NONREACTIVE

## 2019-12-25 LAB — HEPATITIS B SURFACE ANTIGEN: Hepatitis B Surface Ag: NONREACTIVE

## 2019-12-25 SURGERY — DIALYSIS/PERMA CATHETER INSERTION
Anesthesia: Choice

## 2019-12-25 MED ORDER — FAMOTIDINE 20 MG PO TABS: 40.0000 mg | ORAL_TABLET | Freq: Once | ORAL | Status: DC | PRN

## 2019-12-25 MED ORDER — FUROSEMIDE 10 MG/ML IJ SOLN
8.0000 mg/h | INTRAVENOUS | Status: DC
  Administered 2019-12-25 – 2019-12-28 (×2): 8 mg/h via INTRAVENOUS
  Filled 2019-12-25 (×3): qty 25
  Filled 2019-12-25: qty 20
  Filled 2019-12-25: qty 25

## 2019-12-25 MED ORDER — ONDANSETRON HCL 4 MG/2ML IJ SOLN: 4.0000 mg | Freq: Four times a day (QID) | INTRAMUSCULAR | Status: DC | PRN

## 2019-12-25 MED ORDER — SODIUM CHLORIDE 0.9 % IV SOLN: INTRAVENOUS | Status: DC

## 2019-12-25 MED ORDER — CLINDAMYCIN PHOSPHATE 300 MG/50ML IV SOLN
INTRAVENOUS | Status: AC
  Filled 2019-12-25: qty 50

## 2019-12-25 MED ORDER — DIPHENHYDRAMINE HCL 50 MG/ML IJ SOLN: 50.0000 mg | Freq: Once | INTRAMUSCULAR | Status: DC | PRN

## 2019-12-25 MED ORDER — CLINDAMYCIN PHOSPHATE 300 MG/50ML IV SOLN
300.0000 mg | Freq: Once | INTRAVENOUS | Status: DC
  Filled 2019-12-25: qty 50

## 2019-12-25 MED ORDER — METHYLPREDNISOLONE SODIUM SUCC 125 MG IJ SOLR: 125.0000 mg | Freq: Once | INTRAMUSCULAR | Status: DC | PRN

## 2019-12-25 MED ORDER — CHLORHEXIDINE GLUCONATE CLOTH 2 % EX PADS
6.0000 | MEDICATED_PAD | Freq: Every day | CUTANEOUS | Status: DC
  Administered 2019-12-27 – 2019-12-29 (×3): 6 via TOPICAL

## 2019-12-25 MED ORDER — LIDOCAINE HCL (PF) 1 % IJ SOLN
INTRAMUSCULAR | Status: DC | PRN
  Administered 2019-12-25: 5 mL

## 2019-12-25 MED ORDER — LORATADINE 10 MG PO TABS
10.0000 mg | ORAL_TABLET | Freq: Every day | ORAL | Status: DC
  Administered 2019-12-25 – 2019-12-29 (×5): 10 mg via ORAL
  Filled 2019-12-25 (×6): qty 1

## 2019-12-25 MED ORDER — MIDAZOLAM HCL 2 MG/ML PO SYRP
8.0000 mg | ORAL_SOLUTION | Freq: Once | ORAL | Status: DC | PRN
  Filled 2019-12-25: qty 4

## 2019-12-25 MED ORDER — ASPIRIN EC 81 MG PO TBEC
81.0000 mg | DELAYED_RELEASE_TABLET | Freq: Every day | ORAL | Status: DC
  Administered 2019-12-26 – 2019-12-29 (×4): 81 mg via ORAL
  Filled 2019-12-25 (×4): qty 1

## 2019-12-25 MED ORDER — HYDROMORPHONE HCL 1 MG/ML IJ SOLN: 1.0000 mg | Freq: Once | INTRAMUSCULAR | Status: DC | PRN

## 2019-12-25 SURGICAL SUPPLY — 1 items: KIT DIALYSIS CATH TRI 30X13 (CATHETERS) ×3 IMPLANT

## 2019-12-25 NOTE — Telephone Encounter (Signed)
spoke with Arthur Bowen to inform her that Mr Dinari was Iron def. and he will to start oral Iron 325 mg 1 tab po BID with OJ or vitamin C. Arthur Tammie informed me that the patient was hospitalized for his Kidneys and will start Dialysis tomorrow. MD has been made aware.

## 2019-12-25 NOTE — Plan of Care (Signed)
Continuing with plan of care. 

## 2019-12-25 NOTE — Op Note (Signed)
  OPERATIVE NOTE   PROCEDURE: 1. Insertion of temporary dialysis catheter catheter right femoral approach.  PRE-OPERATIVE DIAGNOSIS: Acute on chronic renal insufficiency requiring hemodialysis; volume overload with CHF  POST-OPERATIVE DIAGNOSIS: Same  SURGEON: Katha Cabal M.D.  ANESTHESIA: 1% lidocaine local infiltration  ESTIMATED BLOOD LOSS: Minimal cc  INDICATIONS:   Arthur Bowen is a 79 y.o. male who presents with increasing symptoms secondary to his renal failure.  He has been evaluated by Dr. Candiss Norse and needs to start hemodialysis.  He is therefore undergoing placement of a temporary catheter.  Risks and benefits of been reviewed all questions answered patient agrees to proceed.  DESCRIPTION: After obtaining full informed written consent, the patient was positioned supine. The right was prepped and draped in a sterile fashion. Ultrasound was placed in a sterile sleeve. Ultrasound was utilized to identify the right common femoral vein which is noted to be echolucent and compressible indicating patency. Images recorded for the permanent record. Under real-time visualization a Seldinger needle is inserted into the vein and the guidewires advanced without difficulty. Small counterincision was made at the wire insertion site. Dilator is passed over the wire and the temporary dialysis catheter catheter is fed over the wire without difficulty.  All lumens aspirate and flush easily and are packed with heparin saline. Catheter secured to the skin of the right thigh with 2-0 silk. A sterile dressing is applied with Biopatch.  COMPLICATIONS: None  CONDITION: Unchanged  Hortencia Pilar Office:  [phone] 12/25/2019, 7:42 PM

## 2019-12-25 NOTE — Progress Notes (Signed)
Aware of patient needing hemodialysis outpatient placement. Still waiting on Hep b panel to result. Sending referral. Please contact me with any dialysis placement concerns.  Elvera Bicker Dialysis Coordinator [phone]

## 2019-12-25 NOTE — Progress Notes (Signed)
received call from nursing staff that patient is short of breath while down in specials.  We will plan to dialyze him today for volume removal, after dialysis catheter is available.  Dialysis team has been notified

## 2019-12-25 NOTE — Discharge Instructions (Signed)

## 2019-12-25 NOTE — Progress Notes (Signed)
[facility] Daily Progress Note   Subjective: Patient is well-known to our service.  11/18/19: Left brachial cephalic arteriovenous fistula placement  12/04/19: Last seen in office.  Fistula with bruit and thrill however only 73 weeks old.  Not mature yet.  RAI SEVERNS is a 79 y.o. 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 Was seen with worsening respiratory status with LE edema and rales. Was directed to come to [facility] Patient reports he has been SOB progressively for almost two weeks. Has had significant LE edema. Afebrile. No known COVID contacts.   Objective: Vitals:   12/24/19 2006 12/24/19 2121 12/25/19 0420 12/25/19 0945  BP:  122/61 112/63 111/68  Pulse:  91 89 90  Resp:  (!) 22 (!) 22 16  Temp:  97.7 F (36.5 C) 97.9 F (36.6 C) 97.7 F (36.5 C)  TempSrc:  Oral Oral Oral  SpO2: (!) 88% 95% 95% 99%  Weight:   87.9 kg   Height:        Intake/Output Summary (Last 24 hours) at 12/25/2019 1130 Last data filed at 12/25/2019 0748 Gross per 24 hour  Intake 480 ml  Output 500 ml  Net -20 ml   Physical Exam: A&Ox3, NAD CV: RRR Pulmonary: Decreased bilaterally Abdomen: Soft, Nontender, Nondistended Vascular: Warm distally to toes   Laboratory: CBC    Component Value Date/Time   WBC 9.0 12/25/2019 0507   HGB 8.3 (L) 12/25/2019 0507   HGB 13.5 06/03/2017 0909   HCT 27.6 (L) 12/25/2019 0507   HCT 41.8 06/03/2017 0909   PLT 335 12/25/2019 0507   PLT 330 06/03/2017 0909   BMET    Component Value Date/Time   NA 145 12/25/2019 0507   NA 143 06/05/2018 0837   K 4.1 12/25/2019 0507   CL 111 12/25/2019 0507   CO2 22 12/25/2019 0507   GLUCOSE 112 (H) 12/25/2019 0507   BUN 93 (H) 12/25/2019 0507   BUN 42 (H) 06/05/2018 0837   CREATININE 5.62 (H) 12/25/2019 0507   CALCIUM 8.3 (L)  12/25/2019 0507   GFRNONAA 9 (L) 12/25/2019 0507   GFRAA 10 (L) 12/25/2019 0507   Assessment/Planning: The patient is an 79 year old male with known chronic kidney disease this post left brachiocephalic AV fistula creation on November 18, 2019 admitted directly from his nephrologist office with shortness of breath and uremic symptoms  1) patient is in need of urgent dialysis and his left brachiocephalic AV fistula is not mature. 2) we will plan on PermCath insertion to allow the patient to dialyze in the inpatient outpatient setting until his fistula is mature.  Procedure, risks and benefits explained to the patient.  All questions answered.  Patient wishes to proceed.  Discussed with Dr. Eber Hong Ercelle Winkles PA-C 12/25/2019 11:30 AM

## 2019-12-25 NOTE — Progress Notes (Addendum)
Progress Note    Arthur Bowen  GUR:[id number] DOB: [birth year]  DOA: 12/24/2019 PCP: Glean Hess, MD      Brief Narrative:    Medical records reviewed and are as summarized below:  Arthur Bowen is a 79 y.o. male       Assessment/Plan:   Principal Problem:   CKD (chronic kidney disease) stage 5, GFR less than 15 ml/min (HCC) Active Problems:   Coronary artery disease involving native coronary artery of native heart with angina pectoris ([place])   Dyslipidemia   Essential (primary) hypertension   Focal segmental glomerulosclerosis   Renal mass   Secondary hyperparathyroidism of renal origin ([place])   Anemia due to stage 4 chronic kidney disease ([place])   B12 deficiency   Shortness of breath   ESRD with fluid overload: Nephrologist has been consulted with plan for hemodialysis today.  Temporary dialysis catheter will be placed today for emergent hemodialysis.  Acute on chronic diastolic CHF: Fluid overload likely due to ESRD.  Follow-up with nephrologist for hemodialysis.  No evidence to suggest pneumonia at this time.  Discontinue Levaquin and clindamycin.  Acute hypoxemic respiratory failure: Continue oxygen via nasal cannula.  Use BiPAP as needed for increased work of breathing.  CAD: Continue aspirin and statins  Anemia of chronic kidney disease: H&H is stable.  Continue to monitor  Hypertension: Continue antihypertensives  Hyperlipidemia: Continue statins.  Body mass index is 26.28 kg/m.  Diet Order            Diet NPO time specified  Diet effective now                       Medications:   . [MAR Hold] atorvastatin  40 mg Oral Daily  . [MAR Hold] Chlorhexidine Gluconate Cloth  6 each Topical Q0600  . [MAR Hold] finasteride  5 mg Oral Daily  . [MAR Hold] heparin  5,000 Units Subcutaneous Q8H  . [MAR Hold] loratadine  10 mg Oral Daily  . [MAR Hold] vitamin B-12  1,000 mcg Oral Daily   Continuous Infusions: . sodium chloride 10 mL/hr at  12/25/19 1403  . clindamycin    . furosemide (LASIX) infusion 8 mg/hr (12/25/19 1043)     Anti-infectives (From admission, onward)   Start     Dose/Rate Route Frequency Ordered Stop   12/26/19 1000  levofloxacin (LEVAQUIN) tablet 250 mg  Status:  Discontinued        250 mg Oral Every other day 12/24/19 1916 12/25/19 1451   12/26/19 0000  clindamycin (CLEOCIN) IVPB 300 mg  Status:  Discontinued       Note to Pharmacy: To be given in specials   300 mg 100 mL/hr over 30 Minutes Intravenous  Once 12/25/19 1326 12/25/19 1451   12/25/19 1332  clindamycin (CLEOCIN) 300 MG/50ML IVPB       Note to Pharmacy: Rozanna Box   : cabinet override      12/25/19 1332 12/26/19 0144             Family Communication/Anticipated D/C date and plan/Code Status   DVT prophylaxis: heparin injection 5,000 Units Start: 12/24/19 2200     Code Status: Partial Code  Family Communication: Plan discussed with his daughter at the bedside Disposition Plan:    Status is: Inpatient  Remains inpatient appropriate because:Inpatient level of care appropriate due to severity of illness   Dispo: The patient is from: Home  Anticipated d/c is to: Home              Anticipated d/c date is: > 3 days              Patient currently is not medically stable to d/c.           Subjective:   C/o shortness of breath and and bilateral leg swelling  Objective:    Vitals:   12/25/19 0420 12/25/19 0945 12/25/19 1152 12/25/19 1353  BP: 112/63 111/68 127/64 125/71  Pulse: 89 90 92 94  Resp: (!) 22 16 18  (!) 27  Temp: 97.9 F (36.6 C) 97.7 F (36.5 C) 97.6 F (36.4 C) 97.9 F (36.6 C)  TempSrc: Oral Oral Oral Oral  SpO2: 95% 99% 94% 91%  Weight: 87.9 kg     Height:       No data found.   Intake/Output Summary (Last 24 hours) at 12/25/2019 1455 Last data filed at 12/25/2019 1150 Gross per 24 hour  Intake 480 ml  Output 600 ml  Net -120 ml   Filed Weights   12/24/19 1843  12/25/19 0420  Weight: 89.3 kg 87.9 kg    Exam:  GEN: NAD SKIN: Warm and dry EYES: No pallor or icterus ENT: MMM CV: RRR PULM: Bilateral wheezing and rales ABD: soft, obese, NT, +BS CNS: AAO x 3, non focal EXT: Bilateral leg pitting edema, no tenderness   Data Reviewed:   I have personally reviewed following labs and imaging studies:  Labs: Labs show the following:   Basic Metabolic Panel: Recent Labs  Lab 12/24/19 1936 12/25/19 0507  NA 147* 145  K 4.2 4.1  CL 114* 111  CO2 21* 22  GLUCOSE 147* 112*  BUN 93* 93*  CREATININE 5.74* 5.62*  CALCIUM 8.6* 8.3*   GFR Estimated Creatinine Clearance: 11.3 mL/min (A) (by C-G formula based on SCr of 5.62 mg/dL (H)). Liver Function Tests: No results for input(s): AST, ALT, ALKPHOS, BILITOT, PROT, ALBUMIN in the last 168 hours. No results for input(s): LIPASE, AMYLASE in the last 168 hours. No results for input(s): AMMONIA in the last 168 hours. Coagulation profile No results for input(s): INR, PROTIME in the last 168 hours.  CBC: Recent Labs  Lab 12/22/19 1027 12/24/19 1936 12/25/19 0507  WBC 11.0* 8.7 9.0  NEUTROABS 8.8*  --   --   HGB 8.4* 8.6* 8.3*  HCT 27.1* 27.3* 27.6*  MCV 91.9 91.3 93.9  PLT 343 335 335   Cardiac Enzymes: No results for input(s): CKTOTAL, CKMB, CKMBINDEX, TROPONINI in the last 168 hours. BNP (last 3 results) No results for input(s): PROBNP in the last 8760 hours. CBG: No results for input(s): GLUCAP in the last 168 hours. D-Dimer: No results for input(s): DDIMER in the last 72 hours. Hgb A1c: No results for input(s): HGBA1C in the last 72 hours. Lipid Profile: No results for input(s): CHOL, HDL, LDLCALC, TRIG, CHOLHDL, LDLDIRECT in the last 72 hours. Thyroid function studies: No results for input(s): TSH, T4TOTAL, T3FREE, THYROIDAB in the last 72 hours.  Invalid input(s): FREET3 Anemia work up: No results for input(s): VITAMINB12, FOLATE, FERRITIN, TIBC, IRON, RETICCTPCT in the  last 72 hours. Sepsis Labs: Recent Labs  Lab 12/22/19 1027 12/24/19 1936 12/25/19 0507  WBC 11.0* 8.7 9.0    Microbiology Recent Results (from the past 240 hour(s))  SARS Coronavirus 2 by RT PCR (hospital order, performed in [facility] hospital lab) Nasopharyngeal Nasopharyngeal Swab     Status: None  Collection Time: 12/24/19  7:27 PM   Specimen: Nasopharyngeal Swab  Result Value Ref Range Status   SARS Coronavirus 2 NEGATIVE NEGATIVE Final    Comment: (NOTE) SARS-CoV-2 target nucleic acids are NOT DETECTED.  The SARS-CoV-2 RNA is generally detectable in upper and lower respiratory specimens during the acute phase of infection. The lowest concentration of SARS-CoV-2 viral copies this assay can detect is 250 copies / mL. A negative result does not preclude SARS-CoV-2 infection and should not be used as the sole basis for treatment or other patient management decisions.  A negative result may occur with improper specimen collection / handling, submission of specimen other than nasopharyngeal swab, presence of viral mutation(s) within the areas targeted by this assay, and inadequate number of viral copies (<250 copies / mL). A negative result must be combined with clinical observations, patient history, and epidemiological information.  Fact Sheet for Patients:   StrictlyIdeas.no  Fact Sheet for Healthcare Providers: BankingDealers.co.za  This test is not yet approved or  cleared by the [place] FDA and has been authorized for detection and/or diagnosis of SARS-CoV-2 by FDA under an Emergency Use Authorization (EUA).  This EUA will remain in effect (meaning this test can be used) for the duration of the COVID-19 declaration under Section 564(b)(1) of the Act, 21 U.S.C. section 360bbb-3(b)(1), unless the authorization is terminated or revoked sooner.  Performed at [facility], [address]  [place], [place] [address]     Procedures and diagnostic studies:  X-ray chest PA and lateral  Result Date: 12/24/2019 CLINICAL DATA:  Shortness of breath. EXAM: CHEST - 2 VIEW COMPARISON:  06/05/2018 FINDINGS: Median sternotomy. Cardiomegaly with aortic atherosclerosis. Unchanged mediastinal contours. Bilateral pleural effusions, moderate on the right and small on the left. Vascular congestion. No pneumothorax. Multilevel degenerative change in the spine. IMPRESSION: 1. Bilateral pleural effusions, moderate on the right and small on the left. Vascular congestion. Suspect an CHF/fluid overload. 2. Cardiomegaly with aortic atherosclerosis. Electronically Signed   By: Keith Rake M.D.   On: 12/24/2019 20:49               LOS: 1 day   Nikeia Henkes  [facility]     12/25/2019, 2:55 PM

## 2019-12-25 NOTE — Plan of Care (Signed)
Brief Nutrition Education Note  RD consulted for Renal Education.  RD working remotely.  Patient presented as a direct admission from nephrology office for ESRD with fluid overload and acute on chronic CHF requiring emergent dialysis after presenting with worsening respiratory status, significant LE edema, and rales.   Patient is off floor for procedure, unable to contact via phone. Per notes nephrology contacted by RN with report of patient being short of breath while in specials this afternoon, plans to dialyze for volume removal after dialysis catheter is available.   Will attempt to provide education prior to discharge as able. Patient will also be receiving dietary education by RD at HD outpatient facility. Patient will be able to discuss specific diet questions/concerns at that time. "Low Sodium Nutrition Therapy" handout from [employer] of [employer] has been attached to discharge summary.  Body mass index is 26.28 kg/m. Pt meets criteria for overweight based on current BMI. Patient is currently NPO, no documented meal intakes for review.   Labs reviewed. Na 145 trended down (147 on 7/8), BNP 2021 (H), BUN 93 (H), Cr 5.62 (H), K 4.1 (WNL), no phos for review.  No further nutrition interventions warranted at this time. RD contact information provided. If additional nutrition issues arise, please re-consult RD.  Lajuan Lines, RD, LDN Clinical Nutrition After Hours/Weekend Pager # in [place]

## 2019-12-25 NOTE — Progress Notes (Signed)
Arthur Bowen, Arthur Bowen 12/25/19  Subjective:   Hospital day # 1  Patient known to our practice from outpatient follow-up.  He has advanced CKD with biopsy-proven FSGS.  He comes in for worsening fatigue, loss of appetite, increased fluid overload and worsening shortness of breath over the past several weeks.  He is a direct admit and is expected to start dialysis this admission.   Renal: 07/08 0701 - 07/09 0700 In: 480 [P.O.:480] Out: 400 [Urine:400] Lab Results  Component Value Date   CREATININE 5.62 (H) 12/25/2019   CREATININE 5.74 (H) 12/24/2019   CREATININE 3.50 (H) 11/18/2019     Objective:  Vital signs in last 24 hours:  Temp:  [97.5 F (36.4 C)-97.9 F (36.6 C)] 97.6 F (36.4 C) (07/09 1152) Pulse Rate:  [89-93] 92 (07/09 1152) Resp:  [16-22] 18 (07/09 1152) BP: (111-127)/(61-68) 127/64 (07/09 1152) SpO2:  [88 %-99 %] 94 % (07/09 1152) Weight:  [87.9 kg-89.3 kg] 87.9 kg (07/09 0420)  Weight change:  Filed Weights   12/24/19 1843 12/25/19 0420  Weight: 89.3 kg 87.9 kg    Intake/Output:    Intake/Output Summary (Last 24 hours) at 12/25/2019 1227 Last data filed at 12/25/2019 1150 Gross per 24 hour  Intake 480 ml  Output 600 ml  Net -120 ml     Physical Exam: General:  Older gentleman, sitting up in the chair  HEENT  anicteric, moist oral mucous membrane  Pulm/lungs  decreased breath sounds at bases, [place] O2, mild basilar crackles  CVS/Heart  regular, no rub  Abdomen:   Soft, nontender  Extremities:  1-2+ pitting edema up to mid shins  Neurologic:  Alert, oriented  Skin:  Warm  Access:  Left arm brachiocephalic fistula.  Created 11/18/2019       Basic Metabolic Panel:  Recent Labs  Lab 12/24/19 1936 12/25/19 0507  NA 147* 145  K 4.2 4.1  CL 114* 111  CO2 21* 22  GLUCOSE 147* 112*  BUN 93* 93*  CREATININE 5.74* 5.62*  CALCIUM 8.6* 8.3*     CBC: Recent Labs  Lab 12/22/19 1027 12/24/19 1936 12/25/19 0507  WBC  11.0* 8.7 9.0  NEUTROABS 8.8*  --   --   HGB 8.4* 8.6* 8.3*  HCT 27.1* 27.3* 27.6*  MCV 91.9 91.3 93.9  PLT 343 335 335     No results found for: HEPBSAG, HEPBSAB, HEPBIGM    Microbiology:  Recent Results (from the past 240 hour(s))  SARS Coronavirus 2 by RT PCR (hospital order, performed in [facility] hospital lab) Nasopharyngeal Nasopharyngeal Swab     Status: None   Collection Time: 12/24/19  7:27 PM   Specimen: Nasopharyngeal Swab  Result Value Ref Range Status   SARS Coronavirus 2 NEGATIVE NEGATIVE Final    Comment: (NOTE) SARS-CoV-2 target nucleic acids are NOT DETECTED.  The SARS-CoV-2 RNA is generally detectable in upper and lower respiratory specimens during the acute phase of infection. The lowest concentration of SARS-CoV-2 viral copies this assay can detect is 250 copies / mL. A negative result does not preclude SARS-CoV-2 infection and should not be used as the sole basis for treatment or other patient management decisions.  A negative result may occur with improper specimen collection / handling, submission of specimen other than nasopharyngeal swab, presence of viral mutation(s) within the areas targeted by this assay, and inadequate number of viral copies (<250 copies / mL). A negative result must be combined with clinical observations, patient history, and  epidemiological information.  Fact Sheet for Patients:   StrictlyIdeas.no  Fact Sheet for Healthcare Providers: BankingDealers.co.za  This test is not yet approved or  cleared by the [place] FDA and has been authorized for detection and/or diagnosis of SARS-CoV-2 by FDA under an Emergency Use Authorization (EUA).  This EUA will remain in effect (meaning this test can be used) for the duration of the COVID-19 declaration under Section 564(b)(1) of the Act, 21 U.S.C. section 360bbb-3(b)(1), unless the authorization is terminated or revoked  sooner.  Performed at [facility], [address] [place], [place] [address]     Coagulation Studies: No results for input(s): LABPROT, INR in the last 72 hours.  Urinalysis: No results for input(s): COLORURINE, LABSPEC, PHURINE, GLUCOSEU, HGBUR, BILIRUBINUR, KETONESUR, PROTEINUR, UROBILINOGEN, NITRITE, LEUKOCYTESUR in the last 72 hours.  Invalid input(s): APPERANCEUR    Imaging: X-ray chest PA and lateral  Result Date: 12/24/2019 CLINICAL DATA:  Shortness of breath. EXAM: CHEST - 2 VIEW COMPARISON:  06/05/2018 FINDINGS: Median sternotomy. Cardiomegaly with aortic atherosclerosis. Unchanged mediastinal contours. Bilateral pleural effusions, moderate on the right and small on the left. Vascular congestion. No pneumothorax. Multilevel degenerative change in the spine. IMPRESSION: 1. Bilateral pleural effusions, moderate on the right and small on the left. Vascular congestion. Suspect an CHF/fluid overload. 2. Cardiomegaly with aortic atherosclerosis. Electronically Signed   By: Keith Rake M.D.   On: 12/24/2019 20:49     Medications:    furosemide (LASIX) infusion 8 mg/hr (12/25/19 1043)    atorvastatin  40 mg Oral Daily   Chlorhexidine Gluconate Cloth  6 each Topical Q0600   finasteride  5 mg Oral Daily   heparin  5,000 Units Subcutaneous Q8H   [START ON 12/26/2019] levofloxacin  250 mg Oral QODAY   loratadine  10 mg Oral Daily   vitamin B-12  1,000 mcg Oral Daily     Assessment/ Plan:  79 y.o. 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  Failed prednisone and CellCept Anemia Coronary disease status post CABG Hypertension BPH Hyperlipidemia Left renal mass  Admitted on 12/24/2019 for Shortness of breath [R06.02]  #Stage V chronic kidney disease now progressed to end-stage renal disease Patient has mild uremic symptoms of loss of appetite, decreased energy levels.  He has also developed volume overload therefore is in need for  starting dialysis Most recent labs from July 9 show creatinine of 5.62, GFR of 9, BUN 93 He has a immature left arm brachiocephalic fistula.  Therefore, vascular surgery will be consulted to place a tunneled dialysis catheter Once access is available, we will initiate hemodialysis He will get 3 successive dialysis treatments.  Expected discharge Tuesday or Wednesday Start outpatient dialysis placement for [facility].  #Anemia of chronic kidney disease Lab Results  Component Value Date   HGB 8.3 (L) 12/25/2019   We will consider use of Epogen  #Secondary hyperparathyroidism Lab Results  Component Value Date   CALCIUM 8.3 (L) 12/25/2019   CAION 1.28 11/18/2019  Last PTH 41 from November 26, 2019 Monitor calcium and phosphorus this admission  #Left renal mass Last imaging from October 10, 2018 indicates stable 4.8 cm complex and peripherally calcified exophytic lesion arising from midpole of left kidney consistent with benign complex or proteinaceous cyst        LOS: 1 Arthur Bowen 7/9/202112:27 PM  [place], [place]  Note: This note was prepared with Dragon dictation. Any transcription errors are unintentional

## 2019-12-25 NOTE — Progress Notes (Signed)
Patient presented to specials recovery complaining of shortness of breath. Oxygen saturation is 95% on 4L, previously at 90% on 3L. Respiration rate of 35 per minute and excessive accessory muscle use. Patient was originally scheduled for dialysis for tomorrow; however, once speaking with Dr. Candiss Norse patient will have dialysis today. Marcelle Overlie, [place] and Dr. Mal Misty were also informed. Hezzie Bump, PA presented to the patient's bedside to assess, per Maudie Mercury and Dr. Delana Meyer patient will have tempcath placed today rather than permcath.   Vaughan Sine, RN [facility] 12/25/2019

## 2019-12-26 DIAGNOSIS — J9601 Acute respiratory failure with hypoxia: Secondary | ICD-10-CM | POA: Diagnosis present

## 2019-12-26 LAB — RENAL FUNCTION PANEL
Albumin: 3.2 g/dL — ABNORMAL LOW (ref 3.5–5.0)
Anion gap: 10 (ref 5–15)
BUN: 66 mg/dL — ABNORMAL HIGH (ref 8–23)
CO2: 27 mmol/L (ref 22–32)
Calcium: 8.3 mg/dL — ABNORMAL LOW (ref 8.9–10.3)
Chloride: 107 mmol/L (ref 98–111)
Creatinine, Ser: 4.46 mg/dL — ABNORMAL HIGH (ref 0.61–1.24)
GFR calc Af Amer: 13 mL/min — ABNORMAL LOW (ref >60)
GFR calc non Af Amer: 12 mL/min — ABNORMAL LOW (ref >60)
Glucose, Bld: 137 mg/dL — ABNORMAL HIGH (ref 70–99)
Phosphorus: 4.3 mg/dL (ref 2.5–4.6)
Potassium: 3.8 mmol/L (ref 3.5–5.1)
Sodium: 144 mmol/L (ref 135–145)

## 2019-12-26 LAB — CBC WITH DIFFERENTIAL/PLATELET
Abs Immature Granulocytes: 0.06 10*3/uL (ref 0.00–0.07)
Basophils Absolute: 0 10*3/uL (ref 0.0–0.1)
Basophils Relative: 0 %
Eosinophils Absolute: 0.1 10*3/uL (ref 0.0–0.5)
Eosinophils Relative: 1 %
HCT: 28.4 % — ABNORMAL LOW (ref 39.0–52.0)
Hemoglobin: 8.5 g/dL — ABNORMAL LOW (ref 13.0–17.0)
Immature Granulocytes: 1 %
Lymphocytes Relative: 15 %
Lymphs Abs: 1.3 10*3/uL (ref 0.7–4.0)
MCH: 28.1 pg (ref 26.0–34.0)
MCHC: 29.9 g/dL — ABNORMAL LOW (ref 30.0–36.0)
MCV: 93.7 fL (ref 80.0–100.0)
Monocytes Absolute: 0.6 10*3/uL (ref 0.1–1.0)
Monocytes Relative: 8 %
Neutro Abs: 6.2 10*3/uL (ref 1.7–7.7)
Neutrophils Relative %: 75 %
Platelets: 312 10*3/uL (ref 150–400)
RBC: 3.03 MIL/uL — ABNORMAL LOW (ref 4.22–5.81)
RDW: 16.7 % — ABNORMAL HIGH (ref 11.5–15.5)
WBC: 8.2 10*3/uL (ref 4.0–10.5)
nRBC: 0.6 % — ABNORMAL HIGH (ref 0.0–0.2)

## 2019-12-26 NOTE — Progress Notes (Signed)
This note also relates to the following rows which could not be included: Pulse Rate - Cannot attach notes to unvalidated device data Resp - Cannot attach notes to unvalidated device data SpO2 - Cannot attach notes to unvalidated device data  HD started.

## 2019-12-26 NOTE — Plan of Care (Signed)
Continuing with plan of care. 

## 2019-12-26 NOTE — Progress Notes (Signed)
[place], [place] 12/26/19  Subjective:   Hospital day # 2  Patient seen and evaluated during dialysis treatment.  Ultrafiltration target 1.5 kg today. Starting to feel a bit better.   Renal: 07/09 0701 - 07/10 0700 In: 23 [I.V.:23] Out: 1100 [Urine:600] Lab Results  Component Value Date   CREATININE 4.46 (H) 12/26/2019   CREATININE 5.62 (H) 12/25/2019   CREATININE 5.74 (H) 12/24/2019     Objective:  Vital signs in last 24 hours:  Temp:  [97.6 F (36.4 C)-98.3 F (36.8 C)] 98 F (36.7 C) (07/10 1239) Pulse Rate:  [82-100] 83 (07/10 1240) Resp:  [19-35] 30 (07/10 1340) BP: (109-135)/(42-74) 117/61 (07/10 1240) SpO2:  [91 %-100 %] 100 % (07/10 1340) FiO2 (%):  [40 %] 40 % (07/10 1340)  Weight change:  Filed Weights   12/24/19 1843 12/25/19 0420  Weight: 89.3 kg 87.9 kg    Intake/Output:    Intake/Output Summary (Last 24 hours) at 12/26/2019 1343 Last data filed at 12/26/2019 1316 Gross per 24 hour  Intake 155.79 ml  Output 2075 ml  Net -1919.21 ml     Physical Exam: General:  Older gentleman, laying in bed  HEENT  anicteric, moist oral mucous membrane  Pulm/lungs  decreased breath sounds at bases, Bud O2, mild basilar crackles  CVS/Heart  regular, no rub  Abdomen:   Soft, nontender  Extremities:  2+ pitting edema up to mid shins  Neurologic:  Alert, oriented  Skin:  Warm  Access:  Left arm brachiocephalic fistula.  Created 11/18/2019, right femoral dialysis catheter       Basic Metabolic Panel:  Recent Labs  Lab 12/24/19 1936 12/25/19 0507 12/26/19 0718  NA 147* 145 144  K 4.2 4.1 3.8  CL 114* 111 107  CO2 21* 22 27  GLUCOSE 147* 112* 137*  BUN 93* 93* 66*  CREATININE 5.74* 5.62* 4.46*  CALCIUM 8.6* 8.3* 8.3*  PHOS  --   --  4.3     CBC: Recent Labs  Lab 12/22/19 1027 12/24/19 1936 12/25/19 0507 12/26/19 0718  WBC 11.0* 8.7 9.0 8.2  NEUTROABS 8.8*  --   --  6.2  HGB 8.4* 8.6* 8.3* 8.5*  HCT 27.1*  27.3* 27.6* 28.4*  MCV 91.9 91.3 93.9 93.7  PLT 343 335 335 312      Lab Results  Component Value Date   HEPBSAG NON REACTIVE 12/25/2019   HEPBSAB NON REACTIVE 12/25/2019      Microbiology:  Recent Results (from the past 240 hour(s))  SARS Coronavirus 2 by RT PCR (hospital order, performed in [facility] hospital lab) Nasopharyngeal Nasopharyngeal Swab     Status: None   Collection Time: 12/24/19  7:27 PM   Specimen: Nasopharyngeal Swab  Result Value Ref Range Status   SARS Coronavirus 2 NEGATIVE NEGATIVE Final    Comment: (NOTE) SARS-CoV-2 target nucleic acids are NOT DETECTED.  The SARS-CoV-2 RNA is generally detectable in upper and lower respiratory specimens during the acute phase of infection. The lowest concentration of SARS-CoV-2 viral copies this assay can detect is 250 copies / mL. A negative result does not preclude SARS-CoV-2 infection and should not be used as the sole basis for treatment or other patient management decisions.  A negative result may occur with improper specimen collection / handling, submission of specimen other than nasopharyngeal swab, presence of viral mutation(s) within the areas targeted by this assay, and inadequate number of viral copies (<250 copies / mL). A negative result  must be combined with clinical observations, patient history, and epidemiological information.  Fact Sheet for Patients:   StrictlyIdeas.no  Fact Sheet for Healthcare Providers: BankingDealers.co.za  This test is not yet approved or  cleared by the [place] FDA and has been authorized for detection and/or diagnosis of SARS-CoV-2 by FDA under an Emergency Use Authorization (EUA).  This EUA will remain in effect (meaning this test can be used) for the duration of the COVID-19 declaration under Section 564(b)(1) of the Act, 21 U.S.C. section 360bbb-3(b)(1), unless the authorization is terminated or revoked  sooner.  Performed at [facility], [address] [place], [place] [address]     Coagulation Studies: No results for input(s): LABPROT, INR in the last 72 hours.  Urinalysis: No results for input(s): COLORURINE, LABSPEC, PHURINE, GLUCOSEU, HGBUR, BILIRUBINUR, KETONESUR, PROTEINUR, UROBILINOGEN, NITRITE, LEUKOCYTESUR in the last 72 hours.  Invalid input(s): APPERANCEUR    Imaging: X-ray chest PA and lateral  Result Date: 12/24/2019 CLINICAL DATA:  Shortness of breath. EXAM: CHEST - 2 VIEW COMPARISON:  06/05/2018 FINDINGS: Median sternotomy. Cardiomegaly with aortic atherosclerosis. Unchanged mediastinal contours. Bilateral pleural effusions, moderate on the right and small on the left. Vascular congestion. No pneumothorax. Multilevel degenerative change in the spine. IMPRESSION: 1. Bilateral pleural effusions, moderate on the right and small on the left. Vascular congestion. Suspect an CHF/fluid overload. 2. Cardiomegaly with aortic atherosclerosis. Electronically Signed   By: Keith Rake M.D.   On: 12/24/2019 20:49   PERIPHERAL VASCULAR CATHETERIZATION  Result Date: 12/25/2019 See op note    Medications:   . furosemide (LASIX) infusion 8 mg/hr (12/26/19 0442)   . aspirin EC  81 mg Oral Daily  . atorvastatin  40 mg Oral Daily  . Chlorhexidine Gluconate Cloth  6 each Topical Q0600  . finasteride  5 mg Oral Daily  . heparin  5,000 Units Subcutaneous Q8H  . loratadine  10 mg Oral Daily  . vitamin B-12  1,000 mcg Oral Daily     Assessment/ Plan:  79 y.o. 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 Patient has mild uremic symptoms of loss of appetite,  decreased energy levels.  He has also developed volume overload therefore is in need for starting dialysis Most recent labs from July 9 show creatinine of 5.62, GFR of 9, BUN 93 -Patient seen and evaluated during second dialysis treatment.  Tolerating well.  Suspect he will need PermCath on Monday.  Complete dialysis treatment today.  #Anemia of chronic kidney disease Lab Results  Component Value Date   HGB 8.5 (L) 12/26/2019   We will consider use of Epogen  #Secondary hyperparathyroidism Lab Results  Component Value Date   CALCIUM 8.3 (L) 12/26/2019   CAION 1.28 11/18/2019   PHOS 4.3 12/26/2019  Phosphorus acceptable at 4.3.  Continue to monitor.  #Left renal mass Last imaging from October 10, 2018 indicates stable 4.8 cm complex and peripherally calcified exophytic lesion arising from midpole of left kidney consistent with benign complex or proteinaceous cyst   LOS: 2 Arthur Bowen 7/10/20211:43 PM  [place], [place]  Note: This note was prepared with Dragon dictation. Any transcription errors are unintentional

## 2019-12-26 NOTE — Progress Notes (Signed)
This note also relates to the following rows which could not be included: Pulse Rate - Cannot attach notes to unvalidated device data ECG Heart Rate - Cannot attach notes to unvalidated device data Resp - Cannot attach notes to unvalidated device data SpO2 - Cannot attach notes to unvalidated device data    12/26/19 1353  Assess: MEWS Score  Temp 98 F (36.7 C)  BP 117/71  Level of Consciousness Alert  O2 Device Bi-PAP  Patient Activity (if Appropriate) In bed  FiO2 (%) 40 %  Assess: if the MEWS score is Yellow or Red  Were vital signs taken at a resting state? Yes  Focused Assessment Documented focused assessment  Early Detection of Sepsis Score *See Row Information* Low  MEWS guidelines implemented *See Row Information* Yes  Treat  MEWS Interventions Other (Comment);Consulted Respiratory Therapy (order received to transfer to progressive unit)  Take Vital Signs  Increase Vital Sign Frequency  Yellow: Q 2hr X 2 then Q 4hr X 2, if remains yellow, continue Q 4hrs  Escalate  MEWS: Escalate Yellow: discuss with charge nurse/RN and consider discussing with provider and RRT  Notify: Charge Nurse/RN  Name of Charge Nurse/RN Notified [place], RN  Date Charge Nurse/RN Notified 12/26/19  Time Charge Nurse/RN Notified 1355  Notify: Provider  Provider Name/Title Dr. Mal Misty  Date Provider Notified 12/26/19  Time Provider Notified 1356  Notification Type Page  Notification Reason Change in status  Response See new orders  Date of Provider Response 12/26/19  Time of Provider Response 1356  Notify: Rapid Response  Name of Rapid Response RN Notified John, RN  Date Rapid Response Notified 12/26/19  Time Rapid Response Notified 1400  Document  Patient Outcome Transferred/level of care increased  Progress note created (see row info) Yes

## 2019-12-26 NOTE — Progress Notes (Addendum)
Report given to receiving nurse on progressive unit.  RT contacted and awaiting for RT assist in transport.  Patient transferred to progressive with RT.

## 2019-12-26 NOTE — Progress Notes (Addendum)
Progress Note    Arthur Bowen  MWU:[id number] DOB: 1941-05-19  DOA: 12/24/2019 PCP: Glean Hess, MD      Brief Narrative:    Medical records reviewed and are as summarized below:  Arthur Bowen is a 79 y.o. male       Assessment/Plan:   Principal Problem:   CKD (chronic kidney disease) stage 5, GFR less than 15 ml/min (HCC) Active Problems:   Coronary artery disease involving native coronary artery of native heart with angina pectoris ([place])   Dyslipidemia   Essential (primary) hypertension   Focal segmental glomerulosclerosis   Renal mass   Secondary hyperparathyroidism of renal origin ([place])   Anemia due to stage 4 chronic kidney disease ([place])   B12 deficiency   Shortness of breath   Acute hypoxemic respiratory failure ([place])   ESRD with fluid overload: Hemodialysis was started on 12/25/2019.  He is having another hemodialysis session today.  Continue IV Lasix infusion.  Follow-up with nephrologist.   Acute on chronic diastolic CHF: Fluid overload likely due to ESRD.  Continue IV Lasix infusion.  Follow-up with nephrologist for hemodialysis.  Acute hypoxemic respiratory failure: Patient was still hypoxemic and dyspneic even after hemodialysis today.  Placed on BiPAP.  Transfer to progressive care unit for further evaluation.    CAD: Continue aspirin and statins  Anemia of chronic kidney disease: H&H is stable.  Continue to monitor  Hypertension: Continue antihypertensives  Hyperlipidemia: Continue statins.  Body mass index is 26.28 kg/m.  Diet Order            Diet renal with fluid restriction Fluid restriction: 1200 mL Fluid; Room service appropriate? Yes; Fluid consistency: Thin  Diet effective now                       Medications:   . aspirin EC  81 mg Oral Daily  . atorvastatin  40 mg Oral Daily  . Chlorhexidine Gluconate Cloth  6 each Topical Q0600  . finasteride  5 mg Oral Daily  . heparin  5,000 Units Subcutaneous Q8H  .  loratadine  10 mg Oral Daily  . vitamin B-12  1,000 mcg Oral Daily   Continuous Infusions: . furosemide (LASIX) infusion 8 mg/hr (12/26/19 0442)     Anti-infectives (From admission, onward)   Start     Dose/Rate Route Frequency Ordered Stop   12/26/19 1000  levofloxacin (LEVAQUIN) tablet 250 mg  Status:  Discontinued        250 mg Oral Every other day 12/24/19 1916 12/25/19 1451   12/26/19 0000  clindamycin (CLEOCIN) IVPB 300 mg  Status:  Discontinued       Note to Pharmacy: To be given in specials   300 mg 100 mL/hr over 30 Minutes Intravenous  Once 12/25/19 1326 12/25/19 1451             Family Communication/Anticipated D/C date and plan/Code Status   DVT prophylaxis: heparin injection 5,000 Units Start: 12/24/19 2200     Code Status: Partial Code  Family Communication: Plan discussed with his daughter at the bedside Disposition Plan:    Status is: Inpatient  Remains inpatient appropriate because:Inpatient level of care appropriate due to severity of illness   Dispo: The patient is from: Home              Anticipated d/c is to: Home              Anticipated d/c  date is: > 3 days              Patient currently is not medically stable to d/c.           Subjective:   Overnight events noted.  He required BiPAP last night.  Patient was seen at the hemodialysis unit while he was having dialysis.  He still feels short of breath.  His legs are still swollen.  Objective:    Vitals:   12/26/19 1239 12/26/19 1240 12/26/19 1336 12/26/19 1340  BP: (!) 109/42 117/61    Pulse: 87 83    Resp: 19  (!) 35 (!) 30  Temp: 98 F (36.7 C)     TempSrc: Oral     SpO2: 94%   100%  Weight:      Height:       No data found.   Intake/Output Summary (Last 24 hours) at 12/26/2019 1344 Last data filed at 12/26/2019 1316 Gross per 24 hour  Intake 155.79 ml  Output 2075 ml  Net -1919.21 ml   Filed Weights   12/24/19 1843 12/25/19 0420  Weight: 89.3 kg 87.9 kg     Exam:  GEN: Mild respiratory distress SKIN: Warm and dry EYES: No pallor or icterus ENT: MMM CV: RRR PULM: Bilateral wheezing and rales ABD: soft, obese, NT, +BS CNS: AAO x 3, non focal EXT: Bilateral leg pitting edema, 3+, no tenderness   Data Reviewed:   I have personally reviewed following labs and imaging studies:  Labs: Labs show the following:   Basic Metabolic Panel: Recent Labs  Lab 12/24/19 1936 12/24/19 1936 12/25/19 0507 12/26/19 0718  NA 147*  --  145 144  K 4.2   < > 4.1 3.8  CL 114*  --  111 107  CO2 21*  --  22 27  GLUCOSE 147*  --  112* 137*  BUN 93*  --  93* 66*  CREATININE 5.74*  --  5.62* 4.46*  CALCIUM 8.6*  --  8.3* 8.3*  PHOS  --   --   --  4.3   < > = values in this interval not displayed.   GFR Estimated Creatinine Clearance: 14.3 mL/min (A) (by C-G formula based on SCr of 4.46 mg/dL (H)). Liver Function Tests: Recent Labs  Lab 12/26/19 0718  ALBUMIN 3.2*   No results for input(s): LIPASE, AMYLASE in the last 168 hours. No results for input(s): AMMONIA in the last 168 hours. Coagulation profile No results for input(s): INR, PROTIME in the last 168 hours.  CBC: Recent Labs  Lab 12/22/19 1027 12/24/19 1936 12/25/19 0507 12/26/19 0718  WBC 11.0* 8.7 9.0 8.2  NEUTROABS 8.8*  --   --  6.2  HGB 8.4* 8.6* 8.3* 8.5*  HCT 27.1* 27.3* 27.6* 28.4*  MCV 91.9 91.3 93.9 93.7  PLT 343 335 335 312   Cardiac Enzymes: No results for input(s): CKTOTAL, CKMB, CKMBINDEX, TROPONINI in the last 168 hours. BNP (last 3 results) No results for input(s): PROBNP in the last 8760 hours. CBG: No results for input(s): GLUCAP in the last 168 hours. D-Dimer: No results for input(s): DDIMER in the last 72 hours. Hgb A1c: No results for input(s): HGBA1C in the last 72 hours. Lipid Profile: No results for input(s): CHOL, HDL, LDLCALC, TRIG, CHOLHDL, LDLDIRECT in the last 72 hours. Thyroid function studies: No results for input(s): TSH, T4TOTAL,  T3FREE, THYROIDAB in the last 72 hours.  Invalid input(s): FREET3 Anemia work up: No results for  input(s): VITAMINB12, FOLATE, FERRITIN, TIBC, IRON, RETICCTPCT in the last 72 hours. Sepsis Labs: Recent Labs  Lab 12/22/19 1027 12/24/19 1936 12/25/19 0507 12/26/19 0718  WBC 11.0* 8.7 9.0 8.2    Microbiology Recent Results (from the past 240 hour(s))  SARS Coronavirus 2 by RT PCR (hospital order, performed in [facility] hospital lab) Nasopharyngeal Nasopharyngeal Swab     Status: None   Collection Time: 12/24/19  7:27 PM   Specimen: Nasopharyngeal Swab  Result Value Ref Range Status   SARS Coronavirus 2 NEGATIVE NEGATIVE Final    Comment: (NOTE) SARS-CoV-2 target nucleic acids are NOT DETECTED.  The SARS-CoV-2 RNA is generally detectable in upper and lower respiratory specimens during the acute phase of infection. The lowest concentration of SARS-CoV-2 viral copies this assay can detect is 250 copies / mL. A negative result does not preclude SARS-CoV-2 infection and should not be used as the sole basis for treatment or other patient management decisions.  A negative result may occur with improper specimen collection / handling, submission of specimen other than nasopharyngeal swab, presence of viral mutation(s) within the areas targeted by this assay, and inadequate number of viral copies (<250 copies / mL). A negative result must be combined with clinical observations, patient history, and epidemiological information.  Fact Sheet for Patients:   StrictlyIdeas.no  Fact Sheet for Healthcare Providers: BankingDealers.co.za  This test is not yet approved or  cleared by the [place] FDA and has been authorized for detection and/or diagnosis of SARS-CoV-2 by FDA under an Emergency Use Authorization (EUA).  This EUA will remain in effect (meaning this test can be used) for the duration of the COVID-19 declaration under Section  564(b)(1) of the Act, 21 U.S.C. section 360bbb-3(b)(1), unless the authorization is terminated or revoked sooner.  Performed at [facility], [address] [place], [place] [address]     Procedures and diagnostic studies:  X-ray chest PA and lateral  Result Date: 12/24/2019 CLINICAL DATA:  Shortness of breath. EXAM: CHEST - 2 VIEW COMPARISON:  06/05/2018 FINDINGS: Median sternotomy. Cardiomegaly with aortic atherosclerosis. Unchanged mediastinal contours. Bilateral pleural effusions, moderate on the right and small on the left. Vascular congestion. No pneumothorax. Multilevel degenerative change in the spine. IMPRESSION: 1. Bilateral pleural effusions, moderate on the right and small on the left. Vascular congestion. Suspect an CHF/fluid overload. 2. Cardiomegaly with aortic atherosclerosis. Electronically Signed   By: Keith Rake M.D.   On: 12/24/2019 20:49   PERIPHERAL VASCULAR CATHETERIZATION  Result Date: 12/25/2019 See op note              LOS: 2 days   Lucciano Vitali  [facility]     12/26/2019, 1:44 PM

## 2019-12-26 NOTE — Plan of Care (Signed)
Patient requested CODE STATUS be changed to full code.

## 2019-12-26 NOTE — Progress Notes (Signed)
Patient transferred to 2A from [facility] via bed. Patient started on BIPAP.  No other complaints at this time.  Family at bedside, oriented to room.

## 2019-12-26 NOTE — Progress Notes (Signed)
Called RT to place patient on Bipap per PRN orders. Patient's resps increasing and O2 sat's dropping.

## 2019-12-27 NOTE — Progress Notes (Signed)
[place], [place] 12/27/19  Subjective:   Hospital day # 3  Patient was on BiPAP overnight. Now weaned down to 4 L of oxygen via nasal cannula. Reports feeling better today.   Renal: 07/10 0701 - 07/11 0700 In: 227 [I.V.:227] Out: 1575 [Urine:575] Lab Results  Component Value Date   CREATININE 4.46 (H) 12/26/2019   CREATININE 5.62 (H) 12/25/2019   CREATININE 5.74 (H) 12/24/2019     Objective:  Vital signs in last 24 hours:  Temp:  [97.7 F (36.5 C)-98.8 F (37.1 C)] 97.7 F (36.5 C) (07/11 1008) Pulse Rate:  [82-83] 82 (07/11 1008) Resp:  [18-21] 18 (07/11 1008) BP: (103-123)/(54-74) 113/67 (07/11 1008) SpO2:  [98 %-100 %] 98 % (07/11 1100) Weight:  [86.2 kg-86.9 kg] 86.2 kg (07/11 0106)  Weight change:  Filed Weights   12/25/19 0420 12/26/19 1757 12/27/19 0106  Weight: 87.9 kg 86.9 kg 86.2 kg    Intake/Output:    Intake/Output Summary (Last 24 hours) at 12/27/2019 1617 Last data filed at 12/27/2019 1450 Gross per 24 hour  Intake 94.21 ml  Output 975 ml  Net -880.79 ml     Physical Exam: General:  No acute distress  HEENT  anicteric, moist oral mucous membrane  Pulm/lungs  diminished at bases, normal effort  CVS/Heart  regular, no rub  Abdomen:   Soft, nontender  Extremities:  1+ bilateral lower extremity edema  Neurologic:  Alert, oriented  Skin:  Warm  Access:  Left arm brachiocephalic fistula.  Created 11/18/2019, right femoral dialysis catheter       Basic Metabolic Panel:  Recent Labs  Lab 12/24/19 1936 12/25/19 0507 12/26/19 0718  NA 147* 145 144  K 4.2 4.1 3.8  CL 114* 111 107  CO2 21* 22 27  GLUCOSE 147* 112* 137*  BUN 93* 93* 66*  CREATININE 5.74* 5.62* 4.46*  CALCIUM 8.6* 8.3* 8.3*  PHOS  --   --  4.3     CBC: Recent Labs  Lab 12/22/19 1027 12/24/19 1936 12/25/19 0507 12/26/19 0718  WBC 11.0* 8.7 9.0 8.2  NEUTROABS 8.8*  --   --  6.2  HGB 8.4* 8.6* 8.3* 8.5*  HCT 27.1* 27.3* 27.6*  28.4*  MCV 91.9 91.3 93.9 93.7  PLT 343 335 335 312      Lab Results  Component Value Date   HEPBSAG NON REACTIVE 12/25/2019   HEPBSAB NON REACTIVE 12/25/2019      Microbiology:  Recent Results (from the past 240 hour(s))  SARS Coronavirus 2 by RT PCR (hospital order, performed in [facility] hospital lab) Nasopharyngeal Nasopharyngeal Swab     Status: None   Collection Time: 12/24/19  7:27 PM   Specimen: Nasopharyngeal Swab  Result Value Ref Range Status   SARS Coronavirus 2 NEGATIVE NEGATIVE Final    Comment: (NOTE) SARS-CoV-2 target nucleic acids are NOT DETECTED.  The SARS-CoV-2 RNA is generally detectable in upper and lower respiratory specimens during the acute phase of infection. The lowest concentration of SARS-CoV-2 viral copies this assay can detect is 250 copies / mL. A negative result does not preclude SARS-CoV-2 infection and should not be used as the sole basis for treatment or other patient management decisions.  A negative result may occur with improper specimen collection / handling, submission of specimen other than nasopharyngeal swab, presence of viral mutation(s) within the areas targeted by this assay, and inadequate number of viral copies (<250 copies / mL). A negative result must be combined with  clinical observations, patient history, and epidemiological information.  Fact Sheet for Patients:   StrictlyIdeas.no  Fact Sheet for Healthcare Providers: BankingDealers.co.za  This test is not yet approved or  cleared by the [place] FDA and has been authorized for detection and/or diagnosis of SARS-CoV-2 by FDA under an Emergency Use Authorization (EUA).  This EUA will remain in effect (meaning this test can be used) for the duration of the COVID-19 declaration under Section 564(b)(1) of the Act, 21 U.S.C. section 360bbb-3(b)(1), unless the authorization is terminated or revoked sooner.  Performed  at [facility], [address] [place], [place] [address]     Coagulation Studies: No results for input(s): LABPROT, INR in the last 72 hours.  Urinalysis: No results for input(s): COLORURINE, LABSPEC, PHURINE, GLUCOSEU, HGBUR, BILIRUBINUR, KETONESUR, PROTEINUR, UROBILINOGEN, NITRITE, LEUKOCYTESUR in the last 72 hours.  Invalid input(s): APPERANCEUR    Imaging: No results found.   Medications:   . furosemide (LASIX) infusion 8 mg/hr (12/26/19 0442)   . aspirin EC  81 mg Oral Daily  . atorvastatin  40 mg Oral Daily  . Chlorhexidine Gluconate Cloth  6 each Topical Q0600  . finasteride  5 mg Oral Daily  . heparin  5,000 Units Subcutaneous Q8H  . loratadine  10 mg Oral Daily  . vitamin B-12  1,000 mcg Oral Daily     Assessment/ Plan:  79 y.o. 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 Patient has mild uremic symptoms of loss of appetite, decreased energy levels.  He has also developed volume overload therefore is in need for starting dialysis Most recent labs from July 9 show creatinine of 5.62, GFR of 9, BUN 93 -Patient had second dialysis treatment yesterday.  No urgent indication for dialysis today.  We will plan for dialysis again tomorrow.  PermCath to be placed tomorrow as well.  #Anemia of chronic kidney disease Lab Results  Component Value Date   HGB 8.5 (L) 12/26/2019   Consider Epogen given stability of renal mass.  #Secondary hyperparathyroidism Lab Results  Component Value Date   CALCIUM 8.3 (L) 12/26/2019   CAION 1.28 11/18/2019   PHOS 4.3 12/26/2019  Repeat serum phosphorus tomorrow.  #Left renal mass Last imaging from October 10, 2018  indicates stable 4.8 cm complex and peripherally calcified exophytic lesion arising from midpole of left kidney consistent with benign complex or proteinaceous cyst   LOS: 3 Arthur Bowen 7/11/20214:17 PM  [place], [place]  Note: This note was prepared with Dragon dictation. Any transcription errors are unintentional

## 2019-12-27 NOTE — Progress Notes (Signed)
ESRD in need of permanent access Will make NPO for permacath placement tomorrow.

## 2019-12-27 NOTE — Progress Notes (Addendum)
Progress Note    Arthur Bowen  [id number]:[id number] DOB: 23-Nov-1940  DOA: 12/24/2019 PCP: Glean Hess, MD      Brief Narrative:    Medical records reviewed and are as summarized below:  Arthur Bowen is a 79 y.o. male       Assessment/Plan:   Principal Problem:   CKD (chronic kidney disease) stage 5, GFR less than 15 ml/min (HCC) Active Problems:   Coronary artery disease involving native coronary artery of native heart with angina pectoris ([place])   Dyslipidemia   Essential (primary) hypertension   Focal segmental glomerulosclerosis   Renal mass   Secondary hyperparathyroidism of renal origin ([place])   Anemia due to stage 4 chronic kidney disease ([place])   B12 deficiency   Shortness of breath   Acute hypoxemic respiratory failure ([place])   ESRD with fluid overload: Hemodialysis was started on 12/25/2019. Continue IV Lasix infusion. Temporary dialysis catheter was inserted in the right femoral region on 12/25/2019.  Plan for PermCath placement tomorrow.  Follow-up with nephrologist.  Acute on chronic diastolic CHF: Continue IV Lasix infusion.  Follow-up with nephrologist for hemodialysis.  Acute hypoxemic respiratory failure: He is off of BiPAP.  He is on 4 L/min oxygen via nasal cannula.  Wean off oxygen as able.  CAD: Continue aspirin and statins  Anemia of chronic kidney disease: H&H is stable.  Continue to monitor  Hypertension: Antihypertensives have been held.  Hyperlipidemia: Continue statins.  Left renal mass: Outpatient follow-up with PCP/urologist  Of note, patient said he is no longer on prednisone at home.  Body mass index is 25.78 kg/m.  Diet Order            Diet NPO time specified  Diet effective midnight           Diet renal with fluid restriction Fluid restriction: 1200 mL Fluid; Room service appropriate? Yes; Fluid consistency: Thin  Diet effective now                       Medications:   . aspirin EC  81 mg Oral Daily  .  atorvastatin  40 mg Oral Daily  . Chlorhexidine Gluconate Cloth  6 each Topical Q0600  . finasteride  5 mg Oral Daily  . heparin  5,000 Units Subcutaneous Q8H  . loratadine  10 mg Oral Daily  . vitamin B-12  1,000 mcg Oral Daily   Continuous Infusions: . furosemide (LASIX) infusion 8 mg/hr (12/26/19 0442)     Anti-infectives (From admission, onward)   Start     Dose/Rate Route Frequency Ordered Stop   12/26/19 1000  levofloxacin (LEVAQUIN) tablet 250 mg  Status:  Discontinued        250 mg Oral Every other day 12/24/19 1916 12/25/19 1451   12/26/19 0000  clindamycin (CLEOCIN) IVPB 300 mg  Status:  Discontinued       Note to Pharmacy: To be given in specials   300 mg 100 mL/hr over 30 Minutes Intravenous  Once 12/25/19 1326 12/25/19 1451             Family Communication/Anticipated D/C date and plan/Code Status   DVT prophylaxis: heparin injection 5,000 Units Start: 12/24/19 2200     Code Status: Full Code  Family Communication: Plan discussed with his daughter at the bedside Disposition Plan:    Status is: Inpatient  Remains inpatient appropriate because:Inpatient level of care appropriate due to severity of illness   Dispo:  The patient is from: Home              Anticipated d/c is to: Home              Anticipated d/c date is: > 3 days              Patient currently is not medically stable to d/c.           Subjective:   Overnight events noted.  He feels a little better today.  He used BiPAP last night for respiratory failure.  His breathing is better and leg swelling has resolved.   Objective:    Vitals:   12/27/19 0106 12/27/19 0446 12/27/19 1008 12/27/19 1100  BP:  112/68 113/67   Pulse:  83 82   Resp:  20 18   Temp:  98.7 F (37.1 C) 97.7 F (36.5 C)   TempSrc:  Oral Oral   SpO2:  100% 100% 98%  Weight: 86.2 kg     Height:       No data found.   Intake/Output Summary (Last 24 hours) at 12/27/2019 1333 Last data filed at  12/27/2019 1100 Gross per 24 hour  Intake 94.21 ml  Output 725 ml  Net -630.79 ml   Filed Weights   12/25/19 0420 12/26/19 1757 12/27/19 0106  Weight: 87.9 kg 86.9 kg 86.2 kg    Exam:  GEN: No acute distress SKIN: Warm and dry EYES: No pallor or icterus ENT: MMM CV: Regular rate and rhythm. PULM: Bilateral wheezing at the lung bases.  Rales are less prominent. ABD: soft, obese, NT, +BS CNS: AAO x 3, non focal EXT: No edema or tenderness.   Data Reviewed:   I have personally reviewed following labs and imaging studies:  Labs: Labs show the following:   Basic Metabolic Panel: Recent Labs  Lab 12/24/19 1936 12/24/19 1936 12/25/19 0507 12/26/19 0718  NA 147*  --  145 144  K 4.2   < > 4.1 3.8  CL 114*  --  111 107  CO2 21*  --  22 27  GLUCOSE 147*  --  112* 137*  BUN 93*  --  93* 66*  CREATININE 5.74*  --  5.62* 4.46*  CALCIUM 8.6*  --  8.3* 8.3*  PHOS  --   --   --  4.3   < > = values in this interval not displayed.   GFR Estimated Creatinine Clearance: 14.3 mL/min (A) (by C-G formula based on SCr of 4.46 mg/dL (H)). Liver Function Tests: Recent Labs  Lab 12/26/19 0718  ALBUMIN 3.2*   No results for input(s): LIPASE, AMYLASE in the last 168 hours. No results for input(s): AMMONIA in the last 168 hours. Coagulation profile No results for input(s): INR, PROTIME in the last 168 hours.  CBC: Recent Labs  Lab 12/22/19 1027 12/24/19 1936 12/25/19 0507 12/26/19 0718  WBC 11.0* 8.7 9.0 8.2  NEUTROABS 8.8*  --   --  6.2  HGB 8.4* 8.6* 8.3* 8.5*  HCT 27.1* 27.3* 27.6* 28.4*  MCV 91.9 91.3 93.9 93.7  PLT 343 335 335 312   Cardiac Enzymes: No results for input(s): CKTOTAL, CKMB, CKMBINDEX, TROPONINI in the last 168 hours. BNP (last 3 results) No results for input(s): PROBNP in the last 8760 hours. CBG: No results for input(s): GLUCAP in the last 168 hours. D-Dimer: No results for input(s): DDIMER in the last 72 hours. Hgb A1c: No results for  input(s): HGBA1C in the last  72 hours. Lipid Profile: No results for input(s): CHOL, HDL, LDLCALC, TRIG, CHOLHDL, LDLDIRECT in the last 72 hours. Thyroid function studies: No results for input(s): TSH, T4TOTAL, T3FREE, THYROIDAB in the last 72 hours.  Invalid input(s): FREET3 Anemia work up: No results for input(s): VITAMINB12, FOLATE, FERRITIN, TIBC, IRON, RETICCTPCT in the last 72 hours. Sepsis Labs: Recent Labs  Lab 12/22/19 1027 12/24/19 1936 12/25/19 0507 12/26/19 0718  WBC 11.0* 8.7 9.0 8.2    Microbiology Recent Results (from the past 240 hour(s))  SARS Coronavirus 2 by RT PCR (hospital order, performed in [facility] hospital lab) Nasopharyngeal Nasopharyngeal Swab     Status: None   Collection Time: 12/24/19  7:27 PM   Specimen: Nasopharyngeal Swab  Result Value Ref Range Status   SARS Coronavirus 2 NEGATIVE NEGATIVE Final    Comment: (NOTE) SARS-CoV-2 target nucleic acids are NOT DETECTED.  The SARS-CoV-2 RNA is generally detectable in upper and lower respiratory specimens during the acute phase of infection. The lowest concentration of SARS-CoV-2 viral copies this assay can detect is 250 copies / mL. A negative result does not preclude SARS-CoV-2 infection and should not be used as the sole basis for treatment or other patient management decisions.  A negative result may occur with improper specimen collection / handling, submission of specimen other than nasopharyngeal swab, presence of viral mutation(s) within the areas targeted by this assay, and inadequate number of viral copies (<250 copies / mL). A negative result must be combined with clinical observations, patient history, and epidemiological information.  Fact Sheet for Patients:   StrictlyIdeas.no  Fact Sheet for Healthcare Providers: BankingDealers.co.za  This test is not yet approved or  cleared by the [place] FDA and has been authorized for  detection and/or diagnosis of SARS-CoV-2 by FDA under an Emergency Use Authorization (EUA).  This EUA will remain in effect (meaning this test can be used) for the duration of the COVID-19 declaration under Section 564(b)(1) of the Act, 21 U.S.C. section 360bbb-3(b)(1), unless the authorization is terminated or revoked sooner.  Performed at [facility], [address] [place], [place] [address]     Procedures and diagnostic studies:  PERIPHERAL VASCULAR CATHETERIZATION  Result Date: 12/25/2019 See op note              LOS: 3 days   Cherokee Clowers  [facility]     12/27/2019, 1:33 PM

## 2019-12-28 ENCOUNTER — Encounter: Payer: Self-pay | Admitting: Vascular Surgery

## 2019-12-28 ENCOUNTER — Other Ambulatory Visit (INDEPENDENT_AMBULATORY_CARE_PROVIDER_SITE_OTHER): Payer: Self-pay | Admitting: Vascular Surgery

## 2019-12-28 ENCOUNTER — Inpatient Hospital Stay
Admission: AD | Disposition: A | Discharge status: Home / Self Care | Payer: Self-pay | Source: Ambulatory Visit | Attending: Internal Medicine

## 2019-12-28 ENCOUNTER — Other Ambulatory Visit (INDEPENDENT_AMBULATORY_CARE_PROVIDER_SITE_OTHER): Payer: Self-pay | Admitting: Nurse Practitioner

## 2019-12-28 DIAGNOSIS — N185 Chronic kidney disease, stage 5: Secondary | ICD-10-CM

## 2019-12-28 HISTORY — PX: DIALYSIS/PERMA CATHETER INSERTION: CATH118288

## 2019-12-28 LAB — RENAL FUNCTION PANEL
Albumin: 3.1 g/dL — ABNORMAL LOW (ref 3.5–5.0)
Anion gap: 13 (ref 5–15)
BUN: 57 mg/dL — ABNORMAL HIGH (ref 8–23)
CO2: 26 mmol/L (ref 22–32)
Calcium: 8.1 mg/dL — ABNORMAL LOW (ref 8.9–10.3)
Chloride: 105 mmol/L (ref 98–111)
Creatinine, Ser: 4.3 mg/dL — ABNORMAL HIGH (ref 0.61–1.24)
GFR calc Af Amer: 14 mL/min — ABNORMAL LOW (ref >60)
GFR calc non Af Amer: 12 mL/min — ABNORMAL LOW (ref >60)
Glucose, Bld: 103 mg/dL — ABNORMAL HIGH (ref 70–99)
Phosphorus: 3.8 mg/dL (ref 2.5–4.6)
Potassium: 3.5 mmol/L (ref 3.5–5.1)
Sodium: 144 mmol/L (ref 135–145)

## 2019-12-28 LAB — CBC WITH DIFFERENTIAL/PLATELET
Abs Immature Granulocytes: 0.03 10*3/uL (ref 0.00–0.07)
Basophils Absolute: 0 10*3/uL (ref 0.0–0.1)
Basophils Relative: 0 %
Eosinophils Absolute: 0.1 10*3/uL (ref 0.0–0.5)
Eosinophils Relative: 1 %
HCT: 29.3 % — ABNORMAL LOW (ref 39.0–52.0)
Hemoglobin: 8.8 g/dL — ABNORMAL LOW (ref 13.0–17.0)
Immature Granulocytes: 0 %
Lymphocytes Relative: 18 %
Lymphs Abs: 1.6 10*3/uL (ref 0.7–4.0)
MCH: 28.2 pg (ref 26.0–34.0)
MCHC: 30 g/dL (ref 30.0–36.0)
MCV: 93.9 fL (ref 80.0–100.0)
Monocytes Absolute: 0.8 10*3/uL (ref 0.1–1.0)
Monocytes Relative: 8 %
Neutro Abs: 6.6 10*3/uL (ref 1.7–7.7)
Neutrophils Relative %: 73 %
Platelets: 301 10*3/uL (ref 150–400)
RBC: 3.12 MIL/uL — ABNORMAL LOW (ref 4.22–5.81)
RDW: 16.9 % — ABNORMAL HIGH (ref 11.5–15.5)
WBC: 9.1 10*3/uL (ref 4.0–10.5)
nRBC: 0.2 % (ref 0.0–0.2)

## 2019-12-28 SURGERY — DIALYSIS/PERMA CATHETER INSERTION
Anesthesia: Choice

## 2019-12-28 MED ORDER — FAMOTIDINE 20 MG PO TABS: 40.0000 mg | ORAL_TABLET | Freq: Once | ORAL | Status: DC | PRN

## 2019-12-28 MED ORDER — FENTANYL CITRATE (PF) 100 MCG/2ML IJ SOLN
INTRAMUSCULAR | Status: AC
  Filled 2019-12-28: qty 2

## 2019-12-28 MED ORDER — CLINDAMYCIN PHOSPHATE 300 MG/50ML IV SOLN
300.0000 mg | Freq: Once | INTRAVENOUS | Status: AC
  Filled 2019-12-28: qty 50

## 2019-12-28 MED ORDER — CLINDAMYCIN PHOSPHATE 300 MG/50ML IV SOLN
INTRAVENOUS | Status: AC
  Administered 2019-12-28: 300 mg via INTRAVENOUS
  Filled 2019-12-28: qty 50

## 2019-12-28 MED ORDER — HEPARIN SODIUM (PORCINE) 10000 UNIT/ML IJ SOLN
INTRAMUSCULAR | Status: AC
  Filled 2019-12-28: qty 1

## 2019-12-28 MED ORDER — MIDAZOLAM HCL 5 MG/5ML IJ SOLN
INTRAMUSCULAR | Status: AC
  Filled 2019-12-28: qty 5

## 2019-12-28 MED ORDER — MIDAZOLAM HCL 2 MG/2ML IJ SOLN
INTRAMUSCULAR | Status: DC | PRN
  Administered 2019-12-28: 1 mg via INTRAVENOUS

## 2019-12-28 MED ORDER — SODIUM CHLORIDE 0.9 % IV SOLN: INTRAVENOUS | Status: DC

## 2019-12-28 MED ORDER — FENTANYL CITRATE (PF) 100 MCG/2ML IJ SOLN
INTRAMUSCULAR | Status: DC | PRN
  Administered 2019-12-28: 25 ug via INTRAVENOUS

## 2019-12-28 MED ORDER — MIDAZOLAM HCL 2 MG/ML PO SYRP: 8.0000 mg | ORAL_SOLUTION | Freq: Once | ORAL | Status: DC | PRN

## 2019-12-28 MED ORDER — HYDROMORPHONE HCL 1 MG/ML IJ SOLN: 1.0000 mg | Freq: Once | INTRAMUSCULAR | Status: DC | PRN

## 2019-12-28 MED ORDER — ONDANSETRON HCL 4 MG/2ML IJ SOLN: 4.0000 mg | Freq: Four times a day (QID) | INTRAMUSCULAR | Status: DC | PRN

## 2019-12-28 MED ORDER — DIPHENHYDRAMINE HCL 50 MG/ML IJ SOLN: 50.0000 mg | Freq: Once | INTRAMUSCULAR | Status: DC | PRN

## 2019-12-28 MED ORDER — METHYLPREDNISOLONE SODIUM SUCC 125 MG IJ SOLR: 125.0000 mg | Freq: Once | INTRAMUSCULAR | Status: DC | PRN

## 2019-12-28 SURGICAL SUPPLY — 10 items
BIOPATCH RED 1 DISK 7.0 (GAUZE/BANDAGES/DRESSINGS) ×1 IMPLANT
CATH CANNON HEMO 15FR 19 (HEMODIALYSIS SUPPLIES) ×1 IMPLANT
DERMABOND ADVANCED (GAUZE/BANDAGES/DRESSINGS) ×1
DERMABOND ADVANCED .7 DNX12 (GAUZE/BANDAGES/DRESSINGS) IMPLANT
PACK ANGIOGRAPHY (CUSTOM PROCEDURE TRAY) ×1 IMPLANT
SUT MNCRL 4-0 (SUTURE) ×1
SUT MNCRL 4-0 27XMFL (SUTURE) ×1
SUT PROLENE 0 CT 1 30 (SUTURE) ×1 IMPLANT
SUTURE MNCRL 4-0 27XMF (SUTURE) IMPLANT
TOWEL OR 17X26 4PK STRL BLUE (TOWEL DISPOSABLE) ×1 IMPLANT

## 2019-12-28 NOTE — Care Management Important Message (Signed)
Important Message  Patient Details  Name: Arthur Bowen MRN: [id number] Date of Birth: December 14, 1940   Medicare Important Message Given:  Yes     Dannette Barbara 12/28/2019, 12:07 PM

## 2019-12-28 NOTE — Progress Notes (Signed)
Progress Note    Arthur Bowen  IOE:[id number] DOB: 08/02/1940  DOA: 12/24/2019 PCP: Glean Hess, MD      Brief Narrative:    Medical records reviewed and are as summarized below:  Arthur Bowen is a 79 y.o. male       Assessment/Plan:   Principal Problem:   CKD (chronic kidney disease) stage 5, GFR less than 15 ml/min (HCC) Active Problems:   Coronary artery disease involving native coronary artery of native heart with angina pectoris ([place])   Dyslipidemia   Essential (primary) hypertension   Focal segmental glomerulosclerosis   Renal mass   Secondary hyperparathyroidism of renal origin ([place])   Anemia due to stage 4 chronic kidney disease ([place])   B12 deficiency   Shortness of breath   Acute hypoxemic respiratory failure ([place])   ESRD with fluid overload: Hemodialysis was started on 12/25/2019. Continue IV Lasix infusion. Temporary dialysis catheter was inserted in the right femoral region on 12/25/2019.  S/p placement of right IJ permacath by vascular surgeon today.  Plan for repeat hemodialysis today.  Follow-up with nephrologist.  Acute on chronic diastolic CHF: Continue IV Lasix infusion.  Follow-up with nephrologist for hemodialysis.  Acute hypoxemic respiratory failure: He is down to 2 L/min oxygen via nasal cannula.  Continue to wean as able.  CAD: Continue aspirin and statins  Anemia of chronic kidney disease: H&H is stable.  Continue to monitor  Hypertension: Antihypertensives have been held.  Hyperlipidemia: Continue statins.  Left renal mass: Outpatient follow-up with PCP/urologist  Of note, patient said he is no longer on prednisone at home.  Body mass index is 25.48 kg/m.  Diet Order            Diet renal with fluid restriction Fluid restriction: 1200 mL Fluid; Room service appropriate? Yes; Fluid consistency: Thin  Diet effective now                       Medications:   . [MAR Hold] aspirin EC  81 mg Oral Daily  . [MAR Hold]  atorvastatin  40 mg Oral Daily  . [MAR Hold] Chlorhexidine Gluconate Cloth  6 each Topical Q0600  . fentaNYL      . [MAR Hold] finasteride  5 mg Oral Daily  . heparin      . [MAR Hold] heparin  5,000 Units Subcutaneous Q8H  . [MAR Hold] loratadine  10 mg Oral Daily  . midazolam      . [MAR Hold] vitamin B-12  1,000 mcg Oral Daily   Continuous Infusions: . sodium chloride 10 mL/hr at 12/28/19 1201  . furosemide (LASIX) infusion 8 mg/hr (12/28/19 0644)     Anti-infectives (From admission, onward)   Start     Dose/Rate Route Frequency Ordered Stop   12/28/19 1145  clindamycin (CLEOCIN) IVPB 300 mg        300 mg 100 mL/hr over 30 Minutes Intravenous  Once 12/28/19 1134 12/28/19 1344   12/26/19 1000  levofloxacin (LEVAQUIN) tablet 250 mg  Status:  Discontinued        250 mg Oral Every other day 12/24/19 1916 12/25/19 1451   12/26/19 0000  clindamycin (CLEOCIN) IVPB 300 mg  Status:  Discontinued       Note to Pharmacy: To be given in specials   300 mg 100 mL/hr over 30 Minutes Intravenous  Once 12/25/19 1326 12/25/19 1451  Family Communication/Anticipated D/C date and plan/Code Status   DVT prophylaxis: heparin injection 5,000 Units Start: 12/24/19 2200     Code Status: Full Code  Family Communication: Plan discussed with his daughter at the bedside Disposition Plan:    Status is: Inpatient  Remains inpatient appropriate because:Inpatient level of care appropriate due to severity of illness   Dispo: The patient is from: Home              Anticipated d/c is to: Home              Anticipated d/c date is: > 3 days              Patient currently is not medically stable to d/c.           Subjective:   No acute events overnight.  He feels better.  Leg swelling and shortness of breath have improved.   Objective:    Vitals:   12/28/19 1400 12/28/19 1405 12/28/19 1410 12/28/19 1415  BP: (!) 110/55   130/63  Pulse: 77 93 93   Resp: 18 (!) 21  20 18   Temp:      TempSrc:      SpO2: 97% (!) 87% 97%   Weight:      Height:       No data found.   Intake/Output Summary (Last 24 hours) at 12/28/2019 1423 Last data filed at 12/28/2019 1300 Gross per 24 hour  Intake 195.07 ml  Output 1175 ml  Net -979.93 ml   Filed Weights   12/26/19 1757 12/27/19 0106 12/28/19 0457  Weight: 86.9 kg 86.2 kg 85.2 kg    Exam:  GEN: No acute distress SKIN: Warm and dry EYES: No pallor or icterus ENT: MMM CV: Regular rate and rhythm. PULM: He has bilateral wheezing.  No rales heard ABD: soft, obese, NT, +BS CNS: AAO x 3, non focal EXT: No edema or tenderness.   Data Reviewed:   I have personally reviewed following labs and imaging studies:  Labs: Labs show the following:   Basic Metabolic Panel: Recent Labs  Lab 12/24/19 1936 12/24/19 1936 12/25/19 0507 12/25/19 0507 12/26/19 0718 12/28/19 0620  NA 147*  --  145  --  144 144  K 4.2   < > 4.1   < > 3.8 3.5  CL 114*  --  111  --  107 105  CO2 21*  --  22  --  27 26  GLUCOSE 147*  --  112*  --  137* 103*  BUN 93*  --  93*  --  66* 57*  CREATININE 5.74*  --  5.62*  --  4.46* 4.30*  CALCIUM 8.6*  --  8.3*  --  8.3* 8.1*  PHOS  --   --   --   --  4.3 3.8   < > = values in this interval not displayed.   GFR Estimated Creatinine Clearance: 14.8 mL/min (A) (by C-G formula based on SCr of 4.3 mg/dL (H)). Liver Function Tests: Recent Labs  Lab 12/26/19 0718 12/28/19 0620  ALBUMIN 3.2* 3.1*   No results for input(s): LIPASE, AMYLASE in the last 168 hours. No results for input(s): AMMONIA in the last 168 hours. Coagulation profile No results for input(s): INR, PROTIME in the last 168 hours.  CBC: Recent Labs  Lab 12/22/19 1027 12/24/19 1936 12/25/19 0507 12/26/19 0718 12/28/19 0620  WBC 11.0* 8.7 9.0 8.2 9.1  NEUTROABS 8.8*  --   --  6.2 6.6  HGB 8.4* 8.6* 8.3* 8.5* 8.8*  HCT 27.1* 27.3* 27.6* 28.4* 29.3*  MCV 91.9 91.3 93.9 93.7 93.9  PLT 343 335 335 312 301    Cardiac Enzymes: No results for input(s): CKTOTAL, CKMB, CKMBINDEX, TROPONINI in the last 168 hours. BNP (last 3 results) No results for input(s): PROBNP in the last 8760 hours. CBG: No results for input(s): GLUCAP in the last 168 hours. D-Dimer: No results for input(s): DDIMER in the last 72 hours. Hgb A1c: No results for input(s): HGBA1C in the last 72 hours. Lipid Profile: No results for input(s): CHOL, HDL, LDLCALC, TRIG, CHOLHDL, LDLDIRECT in the last 72 hours. Thyroid function studies: No results for input(s): TSH, T4TOTAL, T3FREE, THYROIDAB in the last 72 hours.  Invalid input(s): FREET3 Anemia work up: No results for input(s): VITAMINB12, FOLATE, FERRITIN, TIBC, IRON, RETICCTPCT in the last 72 hours. Sepsis Labs: Recent Labs  Lab 12/24/19 1936 12/25/19 0507 12/26/19 0718 12/28/19 0620  WBC 8.7 9.0 8.2 9.1    Microbiology Recent Results (from the past 240 hour(s))  SARS Coronavirus 2 by RT PCR (hospital order, performed in [facility] hospital lab) Nasopharyngeal Nasopharyngeal Swab     Status: None   Collection Time: 12/24/19  7:27 PM   Specimen: Nasopharyngeal Swab  Result Value Ref Range Status   SARS Coronavirus 2 NEGATIVE NEGATIVE Final    Comment: (NOTE) SARS-CoV-2 target nucleic acids are NOT DETECTED.  The SARS-CoV-2 RNA is generally detectable in upper and lower respiratory specimens during the acute phase of infection. The lowest concentration of SARS-CoV-2 viral copies this assay can detect is 250 copies / mL. A negative result does not preclude SARS-CoV-2 infection and should not be used as the sole basis for treatment or other patient management decisions.  A negative result may occur with improper specimen collection / handling, submission of specimen other than nasopharyngeal swab, presence of viral mutation(s) within the areas targeted by this assay, and inadequate number of viral copies (<250 copies / mL). A negative result must be combined  with clinical observations, patient history, and epidemiological information.  Fact Sheet for Patients:   StrictlyIdeas.no  Fact Sheet for Healthcare Providers: BankingDealers.co.za  This test is not yet approved or  cleared by the [place] FDA and has been authorized for detection and/or diagnosis of SARS-CoV-2 by FDA under an Emergency Use Authorization (EUA).  This EUA will remain in effect (meaning this test can be used) for the duration of the COVID-19 declaration under Section 564(b)(1) of the Act, 21 U.S.C. section 360bbb-3(b)(1), unless the authorization is terminated or revoked sooner.  Performed at [facility], [address] [place], [place] [address]     Procedures and diagnostic studies:  PERIPHERAL VASCULAR CATHETERIZATION  Result Date: 12/28/2019 See op note              LOS: 4 days   Cage Gupton  [facility]     12/28/2019, 2:23 PM

## 2019-12-28 NOTE — Op Note (Signed)
OPERATIVE NOTE    PRE-OPERATIVE DIAGNOSIS: 1. ESRD   POST-OPERATIVE DIAGNOSIS: same as above  PROCEDURE: 1. Ultrasound guidance for vascular access to the right internal jugular vein 2. Fluoroscopic guidance for placement of catheter 3. Placement of a 19 cm tip to cuff tunneled hemodialysis catheter via the right internal jugular vein  SURGEON: Leotis Pain, MD  ANESTHESIA:  Local with Moderate conscious sedation for approximately 20 minutes using 1 mg of Versed and 25 mcg of Fentanyl  ESTIMATED BLOOD LOSS: 10 cc  FLUORO TIME: less than one minute  CONTRAST: none  FINDING(S): 1.  Patent right internal jugular vein  SPECIMEN(S):  None  INDICATIONS:   Arthur Bowen is a 79 y.o.male who presents with renal failure.  The patient needs long term dialysis access for their ESRD, and a Permcath is necessary.  Risks and benefits are discussed and informed consent is obtained.    DESCRIPTION: After obtaining full informed written consent, the patient was brought back to the vascular suited. The patient's right neck and chest were sterilely prepped and draped in a sterile surgical field was created. Moderate conscious sedation was administered during a face to face encounter with the patient throughout the procedure with my supervision of the RN administering medicines and monitoring the patient's vital signs, pulse oximetry, telemetry and mental status throughout from the start of the procedure until the patient was taken to the recovery room.  The right internal jugular vein was visualized with ultrasound and found to be patent. It was then accessed under direct ultrasound guidance and a permanent image was recorded. A wire was placed. After skin nick and dilatation, the peel-away sheath was placed over the wire. I then turned my attention to an area under the clavicle. Approximately 1-2 fingerbreadths below the clavicle a small counterincision was created and tunneled from the subclavicular  incision to the access site. Using fluoroscopic guidance, a 19 centimeter tip to cuff tunneled hemodialysis catheter was selected, and tunneled from the subclavicular incision to the access site. It was then placed through the peel-away sheath and the peel-away sheath was removed. Using fluoroscopic guidance the catheter tips were parked in the right atrium. The appropriate distal connectors were placed. It withdrew blood well and flushed easily with heparinized saline and a concentrated heparin solution was then placed. It was secured to the chest wall with 2 Prolene sutures. The access incision was closed single 4-0 Monocryl. A 4-0 Monocryl pursestring suture was placed around the exit site. Sterile dressings were placed. The patient tolerated the procedure well and was taken to the recovery room in stable condition.  COMPLICATIONS: None  CONDITION: Stable  Leotis Pain, MD 12/28/2019 1:34 PM   This note was created with Dragon Medical transcription system. Any errors in dictation are purely unintentional.

## 2019-12-28 NOTE — H&P (Signed)
[facility] History & Physical Update  The patient was interviewed and re-examined.  The patient's previous History and Physical has been reviewed and is unchanged.  There is no change in the plan of care. We plan to proceed with the scheduled procedure.  Leotis Pain, MD  12/28/2019, 12:50 PM

## 2019-12-28 NOTE — Progress Notes (Signed)
[place], [place] 12/28/19  Subjective:   Hospital day # 4  Patient doing very well today. Oxygen has been weaned down to 2 L. Due for another dialysis session today as well as PermCath placement.   Renal: 07/11 0701 - 07/12 0700 In: 195.1 [I.V.:195.1] Out: 1175 [Urine:1175] Lab Results  Component Value Date   CREATININE 4.30 (H) 12/28/2019   CREATININE 4.46 (H) 12/26/2019   CREATININE 5.62 (H) 12/25/2019     Objective:  Vital signs in last 24 hours:  Temp:  [97.8 F (36.6 C)-98.6 F (37 C)] 98.5 F (36.9 C) (07/12 1500) Pulse Rate:  [77-93] 80 (07/12 1515) Resp:  [17-22] 17 (07/12 1515) BP: (82-145)/(45-76) 82/45 (07/12 1515) SpO2:  [87 %-100 %] 97 % (07/12 1500) Weight:  [85.2 kg] 85.2 kg (07/12 0457)  Weight change: -1.633 kg Filed Weights   12/26/19 1757 12/27/19 0106 12/28/19 0457  Weight: 86.9 kg 86.2 kg 85.2 kg    Intake/Output:    Intake/Output Summary (Last 24 hours) at 12/28/2019 1525 Last data filed at 12/28/2019 1300 Gross per 24 hour  Intake 195.07 ml  Output 1075 ml  Net -879.93 ml     Physical Exam: General:  No acute distress  HEENT  anicteric, moist oral mucous membrane  Pulm/lungs  diminished at bases, normal effort  CVS/Heart  regular, no rub  Abdomen:   Soft, nontender  Extremities:  1+ bilateral lower extremity edema  Neurologic:  Alert, oriented  Skin:  Warm  Access:  Left arm brachiocephalic fistula.  Created 11/18/2019, right femoral dialysis catheter       Basic Metabolic Panel:  Recent Labs  Lab 12/24/19 1936 12/24/19 1936 12/25/19 0507 12/26/19 0718 12/28/19 0620  NA 147*  --  145 144 144  K 4.2  --  4.1 3.8 3.5  CL 114*  --  111 107 105  CO2 21*  --  22 27 26   GLUCOSE 147*  --  112* 137* 103*  BUN 93*  --  93* 66* 57*  CREATININE 5.74*  --  5.62* 4.46* 4.30*  CALCIUM 8.6*   < > 8.3* 8.3* 8.1*  PHOS  --   --   --  4.3 3.8   < > = values in this interval not displayed.      CBC: Recent Labs  Lab 12/22/19 1027 12/24/19 1936 12/25/19 0507 12/26/19 0718 12/28/19 0620  WBC 11.0* 8.7 9.0 8.2 9.1  NEUTROABS 8.8*  --   --  6.2 6.6  HGB 8.4* 8.6* 8.3* 8.5* 8.8*  HCT 27.1* 27.3* 27.6* 28.4* 29.3*  MCV 91.9 91.3 93.9 93.7 93.9  PLT 343 335 335 312 301      Lab Results  Component Value Date   HEPBSAG NON REACTIVE 12/25/2019   HEPBSAB NON REACTIVE 12/25/2019      Microbiology:  Recent Results (from the past 240 hour(s))  SARS Coronavirus 2 by RT PCR (hospital order, performed in [facility] hospital lab) Nasopharyngeal Nasopharyngeal Swab     Status: None   Collection Time: 12/24/19  7:27 PM   Specimen: Nasopharyngeal Swab  Result Value Ref Range Status   SARS Coronavirus 2 NEGATIVE NEGATIVE Final    Comment: (NOTE) SARS-CoV-2 target nucleic acids are NOT DETECTED.  The SARS-CoV-2 RNA is generally detectable in upper and lower respiratory specimens during the acute phase of infection. The lowest concentration of SARS-CoV-2 viral copies this assay can detect is 250 copies / mL. A negative result does not preclude SARS-CoV-2  infection and should not be used as the sole basis for treatment or other patient management decisions.  A negative result may occur with improper specimen collection / handling, submission of specimen other than nasopharyngeal swab, presence of viral mutation(s) within the areas targeted by this assay, and inadequate number of viral copies (<250 copies / mL). A negative result must be combined with clinical observations, patient history, and epidemiological information.  Fact Sheet for Patients:   StrictlyIdeas.no  Fact Sheet for Healthcare Providers: BankingDealers.co.za  This test is not yet approved or  cleared by the [place] FDA and has been authorized for detection and/or diagnosis of SARS-CoV-2 by FDA under an Emergency Use Authorization (EUA).  This EUA  will remain in effect (meaning this test can be used) for the duration of the COVID-19 declaration under Section 564(b)(1) of the Act, 21 U.S.C. section 360bbb-3(b)(1), unless the authorization is terminated or revoked sooner.  Performed at [facility], [address] [place], [place] [address]     Coagulation Studies: No results for input(s): LABPROT, INR in the last 72 hours.  Urinalysis: No results for input(s): COLORURINE, LABSPEC, PHURINE, GLUCOSEU, HGBUR, BILIRUBINUR, KETONESUR, PROTEINUR, UROBILINOGEN, NITRITE, LEUKOCYTESUR in the last 72 hours.  Invalid input(s): APPERANCEUR    Imaging: PERIPHERAL VASCULAR CATHETERIZATION  Result Date: 12/28/2019 See op note    Medications:   . furosemide (LASIX) infusion 8 mg/hr (12/28/19 0644)   . aspirin EC  81 mg Oral Daily  . atorvastatin  40 mg Oral Daily  . Chlorhexidine Gluconate Cloth  6 each Topical Q0600  . fentaNYL      . finasteride  5 mg Oral Daily  . heparin      . heparin  5,000 Units Subcutaneous Q8H  . loratadine  10 mg Oral Daily  . midazolam      . vitamin B-12  1,000 mcg Oral Daily     Assessment/ Plan:  79 y.o. 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 Patient has mild uremic symptoms of loss of appetite, decreased energy levels.  He has also developed volume overload therefore is in need for starting dialysis Most recent labs from July 9 show creatinine of 5.62, GFR of 9, BUN 93 -Patient due for another dialysis session today as well as PermCath placement.  Outpatient hemodialysis center placement pending.  #Anemia of chronic kidney disease Lab Results  Component Value Date   HGB  8.8 (L) 12/28/2019   Consider Epogen given stability of renal mass.  #Secondary hyperparathyroidism Lab Results  Component Value Date   CALCIUM 8.1 (L) 12/28/2019   CAION 1.28 11/18/2019   PHOS 3.8 12/28/2019  Continue to monitor serum phosphorus as it is currently acceptable.  #Left renal mass Last imaging from October 10, 2018 indicates stable 4.8 cm complex and peripherally calcified exophytic lesion arising from midpole of left kidney consistent with benign complex or proteinaceous cyst   LOS: 4 Arthur Bowen 7/12/20213:25 PM  [place], [place]  Note: This note was prepared with Dragon dictation. Any transcription errors are unintentional

## 2019-12-28 NOTE — Progress Notes (Signed)
This note also relates to the following rows which could not be included: Pulse Rate - Cannot attach notes to unvalidated device data Resp - Cannot attach notes to unvalidated device data  Hd completed  

## 2019-12-28 NOTE — OR Nursing (Signed)
Pt reports burning right forearm IV, unable to flush or aspirate, removed. Furosemide drip connected to trialysis access floor nurse given report. One attempt at IV access right forearm unsuccessful.

## 2019-12-28 NOTE — Progress Notes (Signed)
This note also relates to the following rows which could not be included: Resp - Cannot attach notes to unvalidated device data  Hd started

## 2019-12-28 NOTE — OR Nursing (Signed)
Report called to [facility] floor nurse and Eduard Clos dialysis nurse.

## 2019-12-29 ENCOUNTER — Encounter: Payer: Self-pay | Admitting: Vascular Surgery

## 2019-12-29 NOTE — Discharge Summary (Addendum)
Physician Discharge Summary  Arthur Bowen ATF:[id number] DOB: December 18, 1940 DOA: 12/24/2019  PCP: Glean Hess, MD  Admit date: 12/24/2019 Discharge date: 12/29/2019  Discharge disposition: Home   Recommendations for Outpatient Follow-Up:   Outpatient follow-up with follow-up with PCP in 1 week Follow nephrologist for patient dialysis schedule Mondays, Wednesdays and Fridays   Discharge Diagnosis:   Principal Problem:   CKD (chronic kidney disease) stage 5, GFR less than 15 ml/min (HCC) Active Problems:   Coronary artery disease involving native coronary artery of native heart with angina pectoris ([place])   Dyslipidemia   Essential (primary) hypertension   Focal segmental glomerulosclerosis   Renal mass   Secondary hyperparathyroidism of renal origin ([place])   Anemia due to stage 4 chronic kidney disease ([place])   B12 deficiency   Shortness of breath   Acute hypoxemic respiratory failure ([place])    Discharge Condition: Stable.  Diet recommendation:  Diet Order            Diet renal 60/70-07-20-1198                       Code Status: Full Code     Hospital Course:   [name] is an 79 year old man with medical history significant for CAD, CKD stage IV biopsy proven FSGS, CAD s/p CABG, BPH, hyperlipidemia, hypertension, left renal mass.  He was referred to the hospital by his nephrologist for direct admission because of increasing shortness of breath and lower extremity edema.  He was admitted to the hospital for end-stage renal disease with fluid overload, acute on chronic diastolic CHF and acute hypoxemic respiratory failure.  He was treated with IV Lasix infusion and BiPAP and was subsequently transitioned to oxygen via nasal cannula.  He was started on hemodialysis on this admission for ESRD and fluid overload.  His condition has improved and he has been successfully weaned off of oxygen.  He is deemed stable for discharge to home.  Antihypertensives were  discontinued at discharge because BP has been running on the low side.  Discharge plan was discussed with Dr. Holley Raring, nephrologist.    Medical Consultants:   Vascular surgeon placement of temporary hemodialysis catheter followed by permacath placement. Nephrologist for hemodialysis   Discharge Exam:   Vitals:   12/29/19 0339 12/29/19 0719  BP: 109/62 (!) 105/58  Pulse: 87 80  Resp:  18  Temp: 98.6 F (37 C) 99 F (37.2 C)  SpO2: 100% 100%   Vitals:   12/28/19 1842 12/28/19 2035 12/29/19 0339 12/29/19 0719  BP: (!) 188/66 113/68 109/62 (!) 105/58  Pulse: 88 95 87 80  Resp: 18   18  Temp: 98.7 F (37.1 C) 98.3 F (36.8 C) 98.6 F (37 C) 99 F (37.2 C)  TempSrc:  Oral Oral Oral  SpO2: 100% 99% 100% 100%  Weight:   82.2 kg   Height:         GEN: NAD SKIN: Warm and dry. EYES: No pallor or icterus ENT: MMM CV: RRR PULM: No wheezing or rales heard ABD: soft, ND, NT, +BS CNS: AAO x 3, non focal EXT: No edema or tenderness   The results of significant diagnostics from this hospitalization (including imaging, microbiology, ancillary and laboratory) are listed below for reference.     Procedures and Diagnostic Studies:   X-ray chest PA and lateral  Result Date: 12/24/2019 CLINICAL DATA:  Shortness of breath. EXAM: CHEST - 2 VIEW COMPARISON:  06/05/2018 FINDINGS: Median sternotomy.  Cardiomegaly with aortic atherosclerosis. Unchanged mediastinal contours. Bilateral pleural effusions, moderate on the right and small on the left. Vascular congestion. No pneumothorax. Multilevel degenerative change in the spine. IMPRESSION: 1. Bilateral pleural effusions, moderate on the right and small on the left. Vascular congestion. Suspect an CHF/fluid overload. 2. Cardiomegaly with aortic atherosclerosis. Electronically Signed   By: Keith Rake M.D.   On: 12/24/2019 20:49   PERIPHERAL VASCULAR CATHETERIZATION  Result Date: 12/25/2019 See op note    Labs:   Basic Metabolic  Panel: Recent Labs  Lab 12/24/19 1936 12/24/19 1936 12/25/19 0507 12/25/19 0507 12/26/19 0718 12/28/19 0620  NA 147*  --  145  --  144 144  K 4.2   < > 4.1   < > 3.8 3.5  CL 114*  --  111  --  107 105  CO2 21*  --  22  --  27 26  GLUCOSE 147*  --  112*  --  137* 103*  BUN 93*  --  93*  --  66* 57*  CREATININE 5.74*  --  5.62*  --  4.46* 4.30*  CALCIUM 8.6*  --  8.3*  --  8.3* 8.1*  PHOS  --   --   --   --  4.3 3.8   < > = values in this interval not displayed.   GFR Estimated Creatinine Clearance: 14.8 mL/min (A) (by C-G formula based on SCr of 4.3 mg/dL (H)). Liver Function Tests: Recent Labs  Lab 12/26/19 0718 12/28/19 0620  ALBUMIN 3.2* 3.1*   No results for input(s): LIPASE, AMYLASE in the last 168 hours. No results for input(s): AMMONIA in the last 168 hours. Coagulation profile No results for input(s): INR, PROTIME in the last 168 hours.  CBC: Recent Labs  Lab 12/24/19 1936 12/25/19 0507 12/26/19 0718 12/28/19 0620  WBC 8.7 9.0 8.2 9.1  NEUTROABS  --   --  6.2 6.6  HGB 8.6* 8.3* 8.5* 8.8*  HCT 27.3* 27.6* 28.4* 29.3*  MCV 91.3 93.9 93.7 93.9  PLT 335 335 312 301   Cardiac Enzymes: No results for input(s): CKTOTAL, CKMB, CKMBINDEX, TROPONINI in the last 168 hours. BNP: Invalid input(s): POCBNP CBG: No results for input(s): GLUCAP in the last 168 hours. D-Dimer No results for input(s): DDIMER in the last 72 hours. Hgb A1c No results for input(s): HGBA1C in the last 72 hours. Lipid Profile No results for input(s): CHOL, HDL, LDLCALC, TRIG, CHOLHDL, LDLDIRECT in the last 72 hours. Thyroid function studies No results for input(s): TSH, T4TOTAL, T3FREE, THYROIDAB in the last 72 hours.  Invalid input(s): FREET3 Anemia work up No results for input(s): VITAMINB12, FOLATE, FERRITIN, TIBC, IRON, RETICCTPCT in the last 72 hours. Microbiology Recent Results (from the past 240 hour(s))  SARS Coronavirus 2 by RT PCR (hospital order, performed in [facility]  hospital lab) Nasopharyngeal Nasopharyngeal Swab     Status: None   Collection Time: 12/24/19  7:27 PM   Specimen: Nasopharyngeal Swab  Result Value Ref Range Status   SARS Coronavirus 2 NEGATIVE NEGATIVE Final    Comment: (NOTE) SARS-CoV-2 target nucleic acids are NOT DETECTED.  The SARS-CoV-2 RNA is generally detectable in upper and lower respiratory specimens during the acute phase of infection. The lowest concentration of SARS-CoV-2 viral copies this assay can detect is 250 copies / mL. A negative result does not preclude SARS-CoV-2 infection and should not be used as the sole basis for treatment or other patient management decisions.  A negative result  may occur with improper specimen collection / handling, submission of specimen other than nasopharyngeal swab, presence of viral mutation(s) within the areas targeted by this assay, and inadequate number of viral copies (<250 copies / mL). A negative result must be combined with clinical observations, patient history, and epidemiological information.  Fact Sheet for Patients:   StrictlyIdeas.no  Fact Sheet for Healthcare Providers: BankingDealers.co.za  This test is not yet approved or  cleared by the [place] FDA and has been authorized for detection and/or diagnosis of SARS-CoV-2 by FDA under an Emergency Use Authorization (EUA).  This EUA will remain in effect (meaning this test can be used) for the duration of the COVID-19 declaration under Section 564(b)(1) of the Act, 21 U.S.C. section 360bbb-3(b)(1), unless the authorization is terminated or revoked sooner.  Performed at [facility] [address] [place], [place] [address]      Discharge Instructions:   Discharge Instructions    Diet renal 60/70-07-20-1198   Complete by: As directed    Discharge instructions   Complete by: As directed    Follow up with nephrologist for hemodialysis as scheduled on  Mondays, Wednesdays and Fridays   Increase activity slowly   Complete by: As directed      Allergies as of 12/29/2019      Reactions   Penicillins Hives   Sulfa Antibiotics Hives      Medication List    STOP taking these medications   amLODipine 10 MG tablet Commonly known as: NORVASC   hydrALAZINE 50 MG tablet Commonly known as: APRESOLINE   HYDROcodone-acetaminophen 5-325 MG tablet Commonly known as: Norco   levofloxacin 250 MG tablet Commonly known as: LEVAQUIN   lisinopril 20 MG tablet Commonly known as: ZESTRIL   predniSONE 10 MG tablet Commonly known as: DELTASONE   Veltassa 8.4 g packet Generic drug: patiromer     TAKE these medications   aspirin 81 MG tablet Take 1 tablet by mouth daily. Reported on 09/02/2015   atorvastatin 40 MG tablet Commonly known as: LIPITOR Take 1 tablet by mouth once daily   VITAMIN B 12 PO Take 1,000 mcg by mouth daily. What changed: Another medication with the same name was removed. Continue taking this medication, and follow the directions you see here.   B-12 1000 MCG/ML Kit Inject 1,000 mcg as directed every 30 (thirty) days. What changed: Another medication with the same name was removed. Continue taking this medication, and follow the directions you see here.   famotidine 20 MG tablet Commonly known as: PEPCID Take 1 tablet (20 mg total) by mouth daily.   finasteride 5 MG tablet Commonly known as: PROSCAR Take 1 tablet (5 mg total) by mouth daily.   nitroGLYCERIN 0.4 MG SL tablet Commonly known as: NITROSTAT PLACE 1 TABLET UNDER THE TONGUE EVERY 5 MINUTES AS NEEDED FOR CHEST PAIN   sodium bicarbonate 650 MG tablet Take 650 mg by mouth daily.       Follow-up Information    Schnier, Dolores Lory, MD Follow up in 1 week(s).   Specialties: Vascular Surgery, Cardiology, Radiology, Vascular Surgery Why: Can see Schnier found. Will need bilateral upper extremity vein mapping.  Seen as consult in hospital. Contact  information: [address] [place] [address] [phone]                Time coordinating discharge: 28 minutes  Signed:  Jennye Bowen  [facility] 12/29/2019, 11:46 AM

## 2019-12-29 NOTE — Progress Notes (Signed)
D/c telemetry and piv. No questions from daughter or patient.  Escorted out of hospital via wheelchair by nursing staff.

## 2019-12-29 NOTE — Progress Notes (Signed)
Patient accepted at [facility] MWF 2:12. Patient can start tomorrow 7/14 at 2:15pm.

## 2019-12-29 NOTE — Progress Notes (Signed)
[facility] Daily Progress Note   Subjective: 12/28/19: 1. Ultrasound guidance for vascular access to the right internal jugular vein 2. Fluoroscopic guidance for placement of catheter 3. Placement of a 19 cm tip to cuff tunneled hemodialysis catheter via the right internal jugular vein  12/25/19: 1. Insertion of temporary dialysis catheter catheter right femoral approach.  Patient without complaint this AM.  Patient states he underwent dialysis through PermCath yesterday without issue.  Objective: Vitals:   12/28/19 1842 12/28/19 2035 12/29/19 0339 12/29/19 0719  BP: (!) 188/66 113/68 109/62 (!) 105/58  Pulse: 88 95 87 80  Resp: 18   18  Temp: 98.7 F (37.1 C) 98.3 F (36.8 C) 98.6 F (37 C) 99 F (37.2 C)  TempSrc:  Oral Oral Oral  SpO2: 100% 99% 100% 100%  Weight:   82.2 kg   Height:        Intake/Output Summary (Last 24 hours) at 12/29/2019 0948 Last data filed at 12/29/2019 6803 Gross per 24 hour  Intake 135.05 ml  Output 575 ml  Net -439.95 ml   Physical Exam: A&Ox3, NAD CV: RRR Pulmonary: CTA Bilaterally Abdomen: Soft, Nontender, Nondistended Vascular:  Right IJ PermCath: Intact, clean and dry.  No swelling or drainage.   Laboratory: CBC    Component Value Date/Time   WBC 9.1 12/28/2019 0620   HGB 8.8 (L) 12/28/2019 0620   HGB 13.5 06/03/2017 0909   HCT 29.3 (L) 12/28/2019 0620   HCT 41.8 06/03/2017 0909   PLT 301 12/28/2019 0620   PLT 330 06/03/2017 0909   BMET    Component Value Date/Time   NA 144 12/28/2019 0620   NA 143 06/05/2018 0837   K 3.5 12/28/2019 0620   CL 105 12/28/2019 0620   CO2 26 12/28/2019 0620   GLUCOSE 103 (H) 12/28/2019 0620   BUN 57 (H) 12/28/2019 0620   BUN 42 (H) 06/05/2018 0837   CREATININE 4.30 (H) 12/28/2019 0620   CALCIUM 8.1 (L) 12/28/2019 0620   GFRNONAA 12 (L) 12/28/2019 0620   GFRAA 14 (L) 12/28/2019 0620   Assessment/Planning: The patient is an 79 year old male status post temporary  dialysis placement on Friday and now permacath placement POD #1  1) Patient underwent dialysis via PermCath yesterday.  States functioning well. 2) Happy to see the patient in the outpatient setting if he would like vein mapping for more permanent access. 3) At this time, vascular to sign off.  Please reconsult if needed.  Discussed with Eliot Ford PA-C 12/29/2019 9:48 AM

## 2019-12-29 NOTE — Progress Notes (Signed)
[facility], [place] 12/29/19  Subjective:   [facility] day # 5  Doing much better.   Edema resolved.    Renal: 07/12 0701 - 07/13 0700 In: 135.1 [I.V.:135.1] Out: 975 [Urine:475] Lab Results  Component Value Date   CREATININE 4.30 (H) 12/28/2019   CREATININE 4.46 (H) 12/26/2019   CREATININE 5.62 (H) 12/25/2019     Objective:  Vital signs in last 24 hours:  Temp:  [98.3 F (36.8 C)-99 F (37.2 C)] 99 F (37.2 C) (07/13 0719) Pulse Rate:  [77-95] 80 (07/13 0719) Resp:  [17-26] 18 (07/13 0719) BP: (82-188)/(45-71) 105/58 (07/13 0719) SpO2:  [87 %-100 %] 100 % (07/13 0719) Weight:  [82.2 kg] 82.2 kg (07/13 0339)  Weight change: -3.039 kg Filed Weights   12/27/19 0106 12/28/19 0457 12/29/19 0339  Weight: 86.2 kg 85.2 kg 82.2 kg    Intake/Output:    Intake/Output Summary (Last 24 hours) at 12/29/2019 1029 Last data filed at 12/29/2019 5329 Gross per 24 hour  Intake 135.05 ml  Output 575 ml  Net -439.95 ml     Physical Exam: General:  No acute distress  HEENT  anicteric, moist oral mucous membrane  Pulm/lungs  CTAB, normal effort  CVS/Heart  regular, no rub  Abdomen:   Soft, nontender  Extremities:  No LE edema  Neurologic:  Alert, oriented  Skin:  Warm  Access:  Left arm brachiocephalic fistula.  Created 11/18/2019, right femoral dialysis catheter       Basic Metabolic Panel:  Recent Labs  Lab 12/24/19 1936 12/24/19 1936 12/25/19 0507 12/26/19 0718 12/28/19 0620  NA 147*  --  145 144 144  K 4.2  --  4.1 3.8 3.5  CL 114*  --  111 107 105  CO2 21*  --  22 27 26   GLUCOSE 147*  --  112* 137* 103*  BUN 93*  --  93* 66* 57*  CREATININE 5.74*  --  5.62* 4.46* 4.30*  CALCIUM 8.6*   < > 8.3* 8.3* 8.1*  PHOS  --   --   --  4.3 3.8   < > = values in this interval not displayed.     CBC: Recent Labs  Lab 12/24/19 1936 12/25/19 0507 12/26/19 0718 12/28/19 0620  WBC 8.7 9.0 8.2 9.1  NEUTROABS  --   --  6.2 6.6  HGB  8.6* 8.3* 8.5* 8.8*  HCT 27.3* 27.6* 28.4* 29.3*  MCV 91.3 93.9 93.7 93.9  PLT 335 335 312 301      Lab Results  Component Value Date   HEPBSAG NON REACTIVE 12/25/2019   HEPBSAB NON REACTIVE 12/25/2019      Microbiology:  Recent Results (from the past 240 hour(s))  SARS Coronavirus 2 by RT PCR ([facility] order, performed in [facility]) Nasopharyngeal Nasopharyngeal Swab     Status: None   Collection Time: 12/24/19  7:27 PM   Specimen: Nasopharyngeal Swab  Result Value Ref Range Status   SARS Coronavirus 2 NEGATIVE NEGATIVE Final    Comment: (NOTE) SARS-CoV-2 target nucleic acids are NOT DETECTED.  The SARS-CoV-2 RNA is generally detectable in upper and lower respiratory specimens during the acute phase of infection. The lowest concentration of SARS-CoV-2 viral copies this assay can detect is 250 copies / mL. A negative result does not preclude SARS-CoV-2 infection and should not be used as the sole basis for treatment or other patient management decisions.  A negative result may occur with improper specimen collection /  handling, submission of specimen other than nasopharyngeal swab, presence of viral mutation(s) within the areas targeted by this assay, and inadequate number of viral copies (<250 copies / mL). A negative result must be combined with clinical observations, patient history, and epidemiological information.  Fact Sheet for Patients:   StrictlyIdeas.no  Fact Sheet for Healthcare Providers: BankingDealers.co.za  This test is not yet approved or  cleared by the [place] FDA and has been authorized for detection and/or diagnosis of SARS-CoV-2 by FDA under an Emergency Use Authorization (EUA).  This EUA will remain in effect (meaning this test can be used) for the duration of the COVID-19 declaration under Section 564(b)(1) of the Act, 21 U.S.C. section 360bbb-3(b)(1), unless the authorization is  terminated or revoked sooner.  Performed at [facility], [address] [place], [place] [address]     Coagulation Studies: No results for input(s): LABPROT, INR in the last 72 hours.  Urinalysis: No results for input(s): COLORURINE, LABSPEC, PHURINE, GLUCOSEU, HGBUR, BILIRUBINUR, KETONESUR, PROTEINUR, UROBILINOGEN, NITRITE, LEUKOCYTESUR in the last 72 hours.  Invalid input(s): APPERANCEUR    Imaging: PERIPHERAL VASCULAR CATHETERIZATION  Result Date: 12/28/2019 See op note    Medications:   . furosemide (LASIX) infusion 8 mg/hr (12/29/19 0649)   . aspirin EC  81 mg Oral Daily  . atorvastatin  40 mg Oral Daily  . Chlorhexidine Gluconate Cloth  6 each Topical Q0600  . finasteride  5 mg Oral Daily  . heparin  5,000 Units Subcutaneous Q8H  . loratadine  10 mg Oral Daily  . vitamin B-12  1,000 mcg Oral Daily     Assessment/ Plan:  79 y.o. 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 Patient has mild uremic symptoms of loss of appetite, decreased energy levels.  He has also developed volume overload therefore is in need for starting dialysis Most recent labs from July 9 show creatinine of 5.62, GFR of 9, BUN 93 -Pt had HD yesterday, no acute indication today.  Outpt HD planning for [facility].   #Anemia of chronic kidney disease Lab Results  Component Value Date   HGB 8.8 (L) 12/28/2019   Consider Epogen given stability of renal mass as oupt.   #Secondary hyperparathyroidism Lab Results  Component Value Date   CALCIUM 8.1 (L) 12/28/2019   CAION 1.28 11/18/2019   PHOS 3.8 12/28/2019  Phos at target.   #Left renal mass Last imaging from October 10, 2018 indicates stable 4.8 cm complex and peripherally calcified exophytic lesion arising from midpole of left kidney consistent with benign complex or proteinaceous cyst   LOS: 5 [facility] 7/13/202110:29 AM  [facility], [place]  Note: This note was prepared with Dragon dictation. Any transcription errors are unintentional

## 2019-12-29 NOTE — Progress Notes (Signed)
Right femoral temporary dialysis catheter removed as ordered. Catheter intact. No bleeding noted from site. Pressure held for 30 minutes. Vaseline gauze and dry sterile occlusive dressing applied to site. Pt instructed to lie flat with right leg straight. Pt verbalizes understanding.

## 2019-12-29 NOTE — Progress Notes (Signed)
Primary RN made aware that she can discontinue the HD line as ordered. AC made aware of the order and floor RN can do it.

## 2019-12-30 ENCOUNTER — Other Ambulatory Visit: Payer: Self-pay

## 2019-12-30 DIAGNOSIS — N184 Chronic kidney disease, stage 4 (severe): Secondary | ICD-10-CM

## 2019-12-30 LAB — QUANTIFERON-TB GOLD PLUS (RQFGPL)
QuantiFERON Mitogen Value: >10 IU/mL
QuantiFERON Nil Value: 0.09 IU/mL
QuantiFERON TB1 Ag Value: 0.07 IU/mL
QuantiFERON TB2 Ag Value: 0.09 IU/mL

## 2019-12-30 LAB — QUANTIFERON-TB GOLD PLUS: QuantiFERON-TB Gold Plus: NEGATIVE

## 2020-01-01 DIAGNOSIS — Z992 Dependence on renal dialysis: Secondary | ICD-10-CM | POA: Diagnosis not present

## 2020-01-01 DIAGNOSIS — N186 End stage renal disease: Secondary | ICD-10-CM | POA: Diagnosis not present

## 2020-01-02 ENCOUNTER — Other Ambulatory Visit: Payer: Self-pay | Admitting: Hematology and Oncology

## 2020-01-04 ENCOUNTER — Telehealth: Payer: Self-pay | Admitting: Hematology and Oncology

## 2020-01-04 DIAGNOSIS — Z992 Dependence on renal dialysis: Secondary | ICD-10-CM | POA: Diagnosis not present

## 2020-01-04 DIAGNOSIS — N186 End stage renal disease: Secondary | ICD-10-CM | POA: Diagnosis not present

## 2020-01-04 NOTE — Telephone Encounter (Signed)
His daughter called and said he no longer needed appt for lab/ inj because he is on Dialysis. She cancelled all his appts.

## 2020-01-05 ENCOUNTER — Ambulatory Visit: Payer: Medicare Other

## 2020-01-05 ENCOUNTER — Inpatient Hospital Stay: Payer: Medicare Other

## 2020-01-05 ENCOUNTER — Other Ambulatory Visit: Payer: Medicare Other

## 2020-01-06 ENCOUNTER — Other Ambulatory Visit: Payer: Self-pay

## 2020-01-06 ENCOUNTER — Emergency Department: Payer: Medicare Other

## 2020-01-06 ENCOUNTER — Observation Stay
Admission: EM | Admit: 2020-01-06 | Discharge: 2020-01-09 | Disposition: A | Discharge status: Home / Self Care | Payer: Medicare Other | Attending: Internal Medicine | Admitting: Internal Medicine

## 2020-01-06 DIAGNOSIS — N186 End stage renal disease: Secondary | ICD-10-CM | POA: Diagnosis not present

## 2020-01-06 DIAGNOSIS — I252 Old myocardial infarction: Secondary | ICD-10-CM | POA: Insufficient documentation

## 2020-01-06 DIAGNOSIS — E785 Hyperlipidemia, unspecified: Secondary | ICD-10-CM | POA: Diagnosis not present

## 2020-01-06 DIAGNOSIS — N184 Chronic kidney disease, stage 4 (severe): Secondary | ICD-10-CM

## 2020-01-06 DIAGNOSIS — J90 Pleural effusion, not elsewhere classified: Secondary | ICD-10-CM | POA: Diagnosis not present

## 2020-01-06 DIAGNOSIS — Z87891 Personal history of nicotine dependence: Secondary | ICD-10-CM | POA: Diagnosis not present

## 2020-01-06 DIAGNOSIS — N185 Chronic kidney disease, stage 5: Secondary | ICD-10-CM | POA: Diagnosis present

## 2020-01-06 DIAGNOSIS — I214 Non-ST elevation (NSTEMI) myocardial infarction: Secondary | ICD-10-CM

## 2020-01-06 DIAGNOSIS — I120 Hypertensive chronic kidney disease with stage 5 chronic kidney disease or end stage renal disease: Secondary | ICD-10-CM | POA: Insufficient documentation

## 2020-01-06 DIAGNOSIS — N401 Enlarged prostate with lower urinary tract symptoms: Secondary | ICD-10-CM | POA: Diagnosis present

## 2020-01-06 DIAGNOSIS — Z20822 Contact with and (suspected) exposure to covid-19: Secondary | ICD-10-CM | POA: Insufficient documentation

## 2020-01-06 DIAGNOSIS — R079 Chest pain, unspecified: Principal | ICD-10-CM

## 2020-01-06 DIAGNOSIS — R0789 Other chest pain: Secondary | ICD-10-CM | POA: Diagnosis not present

## 2020-01-06 DIAGNOSIS — Z951 Presence of aortocoronary bypass graft: Secondary | ICD-10-CM | POA: Diagnosis not present

## 2020-01-06 DIAGNOSIS — I25119 Atherosclerotic heart disease of native coronary artery with unspecified angina pectoris: Secondary | ICD-10-CM | POA: Diagnosis not present

## 2020-01-06 DIAGNOSIS — I2510 Atherosclerotic heart disease of native coronary artery without angina pectoris: Secondary | ICD-10-CM | POA: Diagnosis not present

## 2020-01-06 DIAGNOSIS — N051 Unspecified nephritic syndrome with focal and segmental glomerular lesions: Secondary | ICD-10-CM | POA: Diagnosis present

## 2020-01-06 DIAGNOSIS — Z7982 Long term (current) use of aspirin: Secondary | ICD-10-CM | POA: Diagnosis not present

## 2020-01-06 DIAGNOSIS — D631 Anemia in chronic kidney disease: Secondary | ICD-10-CM

## 2020-01-06 DIAGNOSIS — R0902 Hypoxemia: Secondary | ICD-10-CM | POA: Diagnosis not present

## 2020-01-06 DIAGNOSIS — Z743 Need for continuous supervision: Secondary | ICD-10-CM | POA: Diagnosis not present

## 2020-01-06 DIAGNOSIS — R778 Other specified abnormalities of plasma proteins: Secondary | ICD-10-CM

## 2020-01-06 DIAGNOSIS — I10 Essential (primary) hypertension: Secondary | ICD-10-CM

## 2020-01-06 DIAGNOSIS — J9811 Atelectasis: Secondary | ICD-10-CM | POA: Diagnosis not present

## 2020-01-06 DIAGNOSIS — Z992 Dependence on renal dialysis: Secondary | ICD-10-CM | POA: Insufficient documentation

## 2020-01-06 DIAGNOSIS — N138 Other obstructive and reflux uropathy: Secondary | ICD-10-CM

## 2020-01-06 DIAGNOSIS — I5043 Acute on chronic combined systolic (congestive) and diastolic (congestive) heart failure: Secondary | ICD-10-CM

## 2020-01-06 LAB — BASIC METABOLIC PANEL
Anion gap: 16 — ABNORMAL HIGH (ref 5–15)
BUN: 21 mg/dL (ref 8–23)
CO2: 26 mmol/L (ref 22–32)
Calcium: 8.7 mg/dL — ABNORMAL LOW (ref 8.9–10.3)
Chloride: 95 mmol/L — ABNORMAL LOW (ref 98–111)
Creatinine, Ser: 3.7 mg/dL — ABNORMAL HIGH (ref 0.61–1.24)
GFR calc Af Amer: 17 mL/min — ABNORMAL LOW (ref >60)
GFR calc non Af Amer: 14 mL/min — ABNORMAL LOW (ref >60)
Glucose, Bld: 100 mg/dL — ABNORMAL HIGH (ref 70–99)
Potassium: 3.6 mmol/L (ref 3.5–5.1)
Sodium: 137 mmol/L (ref 135–145)

## 2020-01-06 LAB — TYPE AND SCREEN
ABO/RH(D): O POS
Antibody Screen: NEGATIVE

## 2020-01-06 LAB — PROTIME-INR
INR: 1 (ref 0.8–1.2)
Prothrombin Time: 12.3 seconds (ref 11.4–15.2)

## 2020-01-06 LAB — TROPONIN I (HIGH SENSITIVITY)
Troponin I (High Sensitivity): 198 ng/L (ref <18)
Troponin I (High Sensitivity): 219 ng/L (ref <18)
Troponin I (High Sensitivity): 215 ng/L (ref <18)

## 2020-01-06 LAB — CBC
HCT: 26.7 % — ABNORMAL LOW (ref 39.0–52.0)
Hemoglobin: 8 g/dL — ABNORMAL LOW (ref 13.0–17.0)
MCH: 27.5 pg (ref 26.0–34.0)
MCHC: 30 g/dL (ref 30.0–36.0)
MCV: 91.8 fL (ref 80.0–100.0)
Platelets: 204 10*3/uL (ref 150–400)
RBC: 2.91 MIL/uL — ABNORMAL LOW (ref 4.22–5.81)
RDW: 16.6 % — ABNORMAL HIGH (ref 11.5–15.5)
WBC: 5.6 10*3/uL (ref 4.0–10.5)
nRBC: 0 % (ref 0.0–0.2)

## 2020-01-06 LAB — BRAIN NATRIURETIC PEPTIDE: B Natriuretic Peptide: 1954.1 pg/mL — ABNORMAL HIGH (ref 0.0–100.0)

## 2020-01-06 LAB — APTT: aPTT: 31 seconds (ref 24–36)

## 2020-01-06 MED ORDER — PATIROMER SORBITEX CALCIUM 8.4 G PO PACK
1.0000 | PACK | Freq: Every day | ORAL | Status: DC
  Filled 2020-01-06: qty 1

## 2020-01-06 MED ORDER — FAMOTIDINE 20 MG PO TABS
20.0000 mg | ORAL_TABLET | Freq: Every day | ORAL | Status: DC
  Administered 2020-01-06 – 2020-01-09 (×4): 20 mg via ORAL
  Filled 2020-01-06 (×5): qty 1

## 2020-01-06 MED ORDER — ASPIRIN EC 81 MG PO TBEC
81.0000 mg | DELAYED_RELEASE_TABLET | Freq: Every day | ORAL | Status: DC
  Administered 2020-01-07 – 2020-01-09 (×3): 81 mg via ORAL
  Filled 2020-01-06 (×4): qty 1

## 2020-01-06 MED ORDER — ATORVASTATIN CALCIUM 20 MG PO TABS
40.0000 mg | ORAL_TABLET | Freq: Every day | ORAL | Status: DC
  Administered 2020-01-06 – 2020-01-09 (×4): 40 mg via ORAL
  Filled 2020-01-06 (×5): qty 2

## 2020-01-06 MED ORDER — HEPARIN BOLUS VIA INFUSION
4000.0000 [IU] | Freq: Once | INTRAVENOUS | Status: AC
  Administered 2020-01-06: 4000 [IU] via INTRAVENOUS
  Filled 2020-01-06: qty 4000

## 2020-01-06 MED ORDER — SODIUM CHLORIDE 0.9% FLUSH: 3.0000 mL | Freq: Once | INTRAVENOUS | Status: DC

## 2020-01-06 MED ORDER — MORPHINE SULFATE (PF) 2 MG/ML IV SOLN
2.0000 mg | INTRAVENOUS | Status: DC | PRN
  Administered 2020-01-06: 2 mg via INTRAVENOUS
  Filled 2020-01-06: qty 1

## 2020-01-06 MED ORDER — ASPIRIN 81 MG PO CHEW
324.0000 mg | CHEWABLE_TABLET | Freq: Once | ORAL | Status: AC
  Administered 2020-01-06: 324 mg via ORAL
  Filled 2020-01-06: qty 4

## 2020-01-06 MED ORDER — HEPARIN (PORCINE) 25000 UT/250ML-% IV SOLN
1100.0000 [IU]/h | INTRAVENOUS | Status: DC
  Administered 2020-01-06 – 2020-01-07 (×2): 1100 [IU]/h via INTRAVENOUS
  Filled 2020-01-06 (×2): qty 250

## 2020-01-06 MED ORDER — METHOCARBAMOL 1000 MG/10ML IJ SOLN
500.0000 mg | Freq: Three times a day (TID) | INTRAVENOUS | Status: DC | PRN
  Administered 2020-01-06: 500 mg via INTRAVENOUS
  Filled 2020-01-06 (×3): qty 5

## 2020-01-06 MED ORDER — FINASTERIDE 5 MG PO TABS
5.0000 mg | ORAL_TABLET | Freq: Every day | ORAL | Status: DC
  Administered 2020-01-07 – 2020-01-09 (×3): 5 mg via ORAL
  Filled 2020-01-06 (×4): qty 1

## 2020-01-06 MED ORDER — ACETAMINOPHEN 325 MG PO TABS: 650.0000 mg | ORAL_TABLET | ORAL | Status: DC | PRN

## 2020-01-06 NOTE — H&P (Addendum)
[name]:[id number] DOB: [birth year] DOA: 01/06/2020     PCP: Glean Hess, MD   Outpatient Specialists:    Card[facility] NEphrology:   [facility] in the past    Patient arrived to ER on 01/06/20 at 1324 Referred by Attending Blake Divine, MD   Patient coming from: home Lives With family    Chief Complaint:  Chief Complaint  Patient presents with  . Chest Pain    HPI: Arthur Bowen is a 79 y.o. 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  EMS given IV nitroglycerin and helped again with chest pain.  On arrival vital signs 189/83 heart rate function between 100-112  Now chest pain has resolved patient denies any prior fever cough or shortness of breath He is getting dialysis through right IJ catheter and no swelling or pain noted.  Recently been admitted for respiratory failure and fluid overload Felt to be secondary to acute on chronic diastolic CHF he was given IV Lasix and BiPAP and was started on hemodialysis he was able to be weaned off oxygen and discharged to home his antihypertensives had to be held secondary to hypotension during admission He was discharged home on 13 July  Now CP free  Infectious risk factors:  Reports  shortness of breath,   chest pain,      Has   been fully vaccinated against COVID    Initial COVID TEST pending    Lab Results  Component Value Date   [place] 12/24/2019   [place] NEGATIVE 11/13/2019   [place] CANCELED 06/18/2019     Regarding pertinent Chronic problems:    Hyperlipidemia  -  on statins Lipitor Lipid Panel     Component Value Date/Time   CHOL 142 08/19/2019 0917   TRIG 138 08/19/2019 0917   HDL 39 (L) 08/19/2019 0917   CHOLHDL 3.6 08/19/2019  0917   LDLCALC 79 08/19/2019 0917   LABVLDL 24 08/19/2019 0917    HTN on Norvasc, hydralazine lisinopril all on hold for now  chronic CHF diastolic - last echo 7902 showed preserved EF   CAD  - On Aspirin, statin,                 -  followed by cardiology         Status post CABG   On hemodialysis recently started this month Estimated Creatinine Clearance: 17.2 mL/min (A) (by C-G formula based on SCr of 3.7 mg/dL (H)).  Lab Results  Component Value Date   CREATININE 3.70 (H) 01/06/2020   CREATININE 4.30 (H) 12/28/2019   CREATININE 4.46 (H) 12/26/2019      BPH - on Proscar      Chronic anemia -felt to be anemia of chronic disease secondary to CKD baseline hg Hemoglobin & Hematocrit  Recent Labs    12/26/19 0718 12/28/19 0620 01/06/20 1405  HGB 8.5* 8.8* 8.0*    While in ER: Troponin elevated 198 -219   Chest x-ray showing small pleural effusion Got Aspirin 324mg   ER Provider Called: Cardiology   Dr. Gigi Gin They Recommend admit to medicine start heparin dripp Will see in AM    Hospitalist was called for admission for chest pain    The following  Work up has been ordered so far:  Orders Placed This Encounter  Procedures  . SARS CORONAVIRUS 2 (TAT 6-24 HRS) Nasopharyngeal Nasopharyngeal Swab  . DG Chest 2 View  . Basic metabolic panel  . CBC  . Cardiac monitoring  . Document Height and Actual Weight  . Saline Lock IV, Maintain IV access  . Consult to hospitalist  ALL PATIENTS BEING ADMITTED/HAVING PROCEDURES NEED COVID-19 SCREENING  . Pulse oximetry, continuous  . ED EKG     Following Medications were ordered in ER: Medications  sodium chloride flush (NS) 0.9 % injection 3 mL (has no administration in time range)  aspirin chewable tablet 324 mg (324 mg Oral Given 01/06/20 1851)        Consult Orders  (From admission, onward)         Start     Ordered   01/06/20 1853  Consult to hospitalist  ALL PATIENTS BEING ADMITTED/HAVING PROCEDURES NEED  COVID-19 SCREENING  Once       Comments: ALL PATIENTS BEING ADMITTED/HAVING PROCEDURES NEED COVID-19 SCREENING  Provider:  (Not yet assigned)  Question Answer Comment  Place call to: [phone]   Reason for Consult Admit      01/06/20 1853          Significant initial  Findings: Abnormal Labs Reviewed  BASIC METABOLIC PANEL - Abnormal; Notable for the following components:      Result Value   Chloride 95 (*)    Glucose, Bld 100 (*)    Creatinine, Ser 3.70 (*)    Calcium 8.7 (*)    GFR calc non Af Amer 14 (*)    GFR calc Af Amer 17 (*)    Anion gap 16 (*)    All other components within normal limits  CBC - Abnormal; Notable for the following components:   RBC 2.91 (*)    Hemoglobin 8.0 (*)    HCT 26.7 (*)    RDW 16.6 (*)    All other components within normal limits  TROPONIN I (HIGH SENSITIVITY) - Abnormal; Notable for the following components:   Troponin I (High Sensitivity) 198 (*)    All other components within normal limits  TROPONIN I (HIGH SENSITIVITY) - Abnormal; Notable for the following components:   Troponin I (High Sensitivity) 219 (*)    All other components within normal limits     Otherwise labs showing:    Recent Labs  Lab 01/06/20 1405  NA 137  K 3.6  CO2 26  GLUCOSE 100*  BUN 21  CREATININE 3.70*  CALCIUM 8.7*   Cr  Lab Results  Component Value Date   CREATININE 3.70 (H) 01/06/2020   CREATININE 4.30 (H) 12/28/2019   CREATININE 4.46 (H) 12/26/2019    No results for input(s): AST, ALT, ALKPHOS, BILITOT, PROT, ALBUMIN in the last 168 hours. Lab Results  Component Value Date   CALCIUM 8.7 (L) 01/06/2020   PHOS 3.8 12/28/2019     WBC      Component Value Date/Time   WBC 5.6 01/06/2020 1405   ANC    Component Value Date/Time   NEUTROABS 6.6 12/28/2019 0620   NEUTROABS 4.5 06/03/2017 0909   ALC No components found for: LYMPHAB    Plt: Lab Results  Component Value Date   PLT 204 01/06/2020       COVID-19 Labs  No results  for input(s): DDIMER, FERRITIN, LDH, CRP in the last 72 hours.  Lab Results  Component Value Date   SARSCOV2NAA NEGATIVE  12/24/2019   SARSCOV2NAA NEGATIVE 11/13/2019   SARSCOV2NAA CANCELED 06/18/2019       HG/HCT    Stable     Component Value Date/Time   HGB 8.0 (L) 01/06/2020 1405   HGB 13.5 06/03/2017 0909   HCT 26.7 (L) 01/06/2020 1405   HCT 41.8 06/03/2017 0909    No results for input(s): LIPASE, AMYLASE in the last 168 hours. No results for input(s): AMMONIA in the last 168 hours.    Troponin 198 - 219   ECG: Ordered Personally reviewed by me showing: HR : 98 Rhythm: , RBBB, LAFB  nonspecific changes,   QTC 528   BNP (last 3 results) Recent Labs    12/24/19 1936  BNP 2,021.2*    DM  labs:  HbA1C: No results for input(s): HGBA1C in the last 8760 hours.     CBG (last 3)  No results for input(s): GLUCAP in the last 72 hours.       Ordered   CXR -  Small bilateral pleural effusion R>L    ED Triage Vitals  Enc Vitals Group     BP 01/06/20 1404 102/87     Pulse Rate 01/06/20 1404 95     Resp 01/06/20 1404 20     Temp 01/06/20 1404 98.1 F (36.7 C)     Temp Source 01/06/20 1404 Oral     SpO2 01/06/20 1404 100 %     Weight 01/06/20 1405 185 lb (83.9 kg)     Height 01/06/20 1405 6' (1.829 m)     Head Circumference --      Peak Flow --      Pain Score 01/06/20 1411 1     Pain Loc --      Pain Edu? --      Excl. in [place]? --   TMAX(24)@       Latest  Blood pressure 125/68, pulse 96, temperature 98.1 F (36.7 C), temperature source Oral, resp. rate 20, height 6' (1.829 m), weight 83.9 kg, SpO2 98 %.     Review of Systems:    Pertinent positives include:  chest pain, shortness of breath at rest  Constitutional:  No weight loss, night sweats, Fevers, chills, fatigue, weight loss  HEENT:  No headaches, Difficulty swallowing,Tooth/dental problems,Sore throat,  No sneezing, itching, ear ache, nasal congestion, post nasal drip,  Cardio-vascular:   No Orthopnea, PND, anasarca, dizziness, palpitations.no Bilateral lower extremity swelling  GI:  No heartburn, indigestion, abdominal pain, nausea, vomiting, diarrhea, change in bowel habits, loss of appetite, melena, blood in stool, hematemesis Resp:  no . No dyspnea on exertion, No excess mucus, no productive cough, No non-productive cough, No coughing up of blood.No change in color of mucus.No wheezing. Skin:  no rash or lesions. No jaundice GU:  no dysuria, change in color of urine, no urgency or frequency. No straining to urinate.  No flank pain.  Musculoskeletal:  No joint pain or no joint swelling. No decreased range of motion. No back pain.  Psych:  No change in mood or affect. No depression or anxiety. No memory loss.  Neuro: no localizing neurological complaints, no tingling, no weakness, no double vision, no gait abnormality, no slurred speech, no confusion  All systems reviewed and apart from [place] all are negative  Past Medical History:   Past Medical History:  Diagnosis Date  . Acquired phimosis   . BPH (benign prostatic hypertrophy)   . Chronic kidney disease   . Dyspnea    with  exertion  . Elevated PSA   . GERD (gastroesophageal reflux disease)   . Gross hematuria   . Hematuria   . History of hiatal hernia   . Hypertension   . 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 Renal mass, left   . Symptomatic anemia 11/21/2017       Past Surgical History:  Procedure Laterality Date  . AV FISTULA PLACEMENT Left 11/18/2019   Procedure: ARTERIOVENOUS (AV) FISTULA CREATION (BRACHIAL CEPHALIC);  Surgeon: Katha Cabal, MD;  Location: [facility] ORS;  Service: Vascular;  Laterality: Left;  . Circumscision  09/2015  . CORONARY ARTERY BYPASS GRAFT  2002   x3 @ [place]  . DIALYSIS/PERMA CATHETER INSERTION N/A 12/25/2019   Procedure: DIALYSIS/PERMA CATHETER INSERTION;  Surgeon: Katha Cabal, MD;  Location: [place] [facility];  Service: Cardiovascular;  Laterality:  N/A;  . DIALYSIS/PERMA CATHETER INSERTION N/A 12/28/2019   Procedure: DIALYSIS/PERMA CATHETER INSERTION;  Surgeon: Algernon Huxley, MD;  Location: [place] [facility];  Service: Cardiovascular;  Laterality: N/A;  . INGUINAL HERNIA REPAIR Left 2011  . RENAL BIOPSY  2014   neg in w/u renal mass/hematuria    Social History:  Ambulatory  cane,       reports that he quit smoking about 2 years ago. His smoking use included cigarettes. He has a 25.00 pack-year smoking history. He has quit using smokeless tobacco.  His smokeless tobacco use included chew. He reports that he does not drink alcohol and does not use drugs.   Family History:   Family History  Problem Relation Age of Onset  . Lymphoma Mother   . Hodgkin's lymphoma Mother   . Lung cancer Father   . Heart disease Brother   . Dementia Sister   . Dementia Brother   . Breast cancer Maternal Aunt     Allergies: Allergies  Allergen Reactions  . Penicillins Hives  . Sulfa Antibiotics Hives     Prior to Admission medications   Medication Sig Start Date End Date Taking? Authorizing Provider  aspirin 81 MG tablet Take 1 tablet by mouth daily. Reported on 09/02/2015   Yes [provider]  atorvastatin (LIPITOR) 40 MG tablet Take 1 tablet by mouth once daily 08/13/19  Yes Glean Hess, MD  cyanocobalamin (,VITAMIN B-12,) 1000 MCG/ML injection INJECT 1,000 MCG (1ML) AS DIRECTED EVERY 30 DAYS 01/04/20  Yes Corcoran, Drue Second, MD  famotidine (PEPCID) 20 MG tablet Take 1 tablet (20 mg total) by mouth daily. 08/19/19  Yes Glean Hess, MD  finasteride (PROSCAR) 5 MG tablet Take 1 tablet (5 mg total) by mouth daily. 08/19/19  Yes Glean Hess, MD  VELTASSA 8.4 g packet Take 1 packet by mouth daily. 12/31/19  Yes [provider]  nitroGLYCERIN (NITROSTAT) 0.4 MG SL tablet PLACE 1 TABLET UNDER THE TONGUE EVERY 5 MINUTES AS NEEDED FOR CHEST PAIN 11/28/19   Glean Hess, MD   Physical Exam: Vitals with BMI  01/06/2020 01/06/2020 01/06/2020  Height - 6\' 0"  -  Weight - 185 lbs -  BMI - 40.10 -  Systolic 272 - 536  Diastolic 68 - 87  Pulse 96 - 95     1. General:  in No  Acute distress    Chronically ill  -appearing 2. Psychological: Alert and  Oriented 3. Head/ENT:   Moist   Mucous Membranes  Head Non traumatic, neck supple                            Poor Dentition 4. SKIN: normal   Skin turgor,  Skin clean Dry and intact no rash 5. Heart: Regular rate and rhythm no  Murmur, no Rub or gallop 6. Lungs:  no wheezes or crackles   7. Abdomen: Soft,  non-tender, Non distended   Obese bowel sounds present 8. Lower extremities: no clubbing, cyanosis, no  edema 9. Neurologically Grossly intact, moving all 4 extremities equally   10. MSK: Normal range of motion Chest pain reproduced by palpation of her chest  All other LABS:     Recent Labs  Lab 01/06/20 1405  WBC 5.6  HGB 8.0*  HCT 26.7*  MCV 91.8  PLT 204     Recent Labs  Lab 01/06/20 1405  NA 137  K 3.6  CL 95*  CO2 26  GLUCOSE 100*  BUN 21  CREATININE 3.70*  CALCIUM 8.7*     No results for input(s): AST, ALT, ALKPHOS, BILITOT, PROT, ALBUMIN in the last 168 hours.     Cultures: No results found for: SDES, [place], CULT, REPTSTATUS   Radiological Exams on Admission: DG Chest 2 View  Result Date: 01/06/2020 CLINICAL DATA:  Left-sided chest pain EXAM: CHEST - 2 VIEW COMPARISON:  12/24/2019 FINDINGS: Interval placement right internal jugular approach hemodialysis catheter with distal tip terminating level of the superior cavoatrial junction. Stable heart size. Atherosclerotic calcification of the aortic knob. Small bilateral pleural effusions, right greater than left. Streaky bibasilar opacities, favor atelectasis. Pneumothorax. IMPRESSION: 1. Small bilateral pleural effusions, right greater than left. 2. Streaky bibasilar opacities, favor atelectasis. Electronically Signed   By: Davina Poke  D.O.   On: 01/06/2020 15:01    Chart has been reviewed    Assessment/Plan  79 y.o. 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 Chest pain versus NSTEMI- - H=  1  ,E=   1  ,A=  2   , R   2  , T  1  ,  for the  Total of 7  therefore will admit for observation and further evaluation ( Risk of MACE: Scores 0-3  of 0.9-1.7%.,  4-6: 12-16.6% , Scores ?7: 50-65% )   - troponin elevated but stable around 200 -No acute ischemic changes on ECG   -  Other explanation for chest pain could be  musculoskeletal will treat supportively   - monitor on telemetry, cycle cardiac enzymes, obtain serial ECG and  ECHO in AM.   - Daily aspirin -  Further risk stratify with lipid panel, hgA1C, obtain TSH.  Make sure patient is on Aspirin.    cardiology is aware regarding patient's admission. Further management depends on pending  Workup  Patient had a episode of chest pain while being evaluated by me.  EKG nonchanging troponin remained stable around 200.  Chest pain while occurred was easily reproducible by palpation over a quarter area in the left upper chest reproducible also by motion of the left arm.   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 as he has not completed his hemodialysis today.  Appreciate their help  . Anemia  due to stage 4 chronic kidney disease ([place]) - defer to nephrology if able to transfuse during HD or perhaps epoetin could be given in case will obtain type and screen  . BPH with obstruction/lower urinary tract symptoms -chronic stable continue home medications  . Coronary artery disease involving native coronary artery of native heart with angina pectoris ([place]) -continue aspirin and statin appreciate cardiology input  . Dyslipidemia -continue Lipitor  .  Essential (primary) hypertension - chronic stable blood pressure medications were on hold continue to monitor and resume if able patient is to get hemodialysis tomorrow  . Focal segmental glomerulosclerosis chronic defer to nephrology   . Elevated troponin -  - no EKG changes but positive for Chest pain  in the setting of  chronic kidney disease likely due to demand ischemia and poor clearance, given known hx of CAD discussed with cardiology who recommended starting heparin drip monitor on telemetry and cycle cardiac enzymes to trend.    QTC - prolongation - will monitor on tele avoid QT prolonging medications, rehydrate correct electrolytes  Renal Mass - need to follow up as an outpatient    Other plan as per orders.  DVT prophylaxis:  heparin    Code Status:    Code Status: Prior FULL CODE  as per patient   I had personally discussed CODE STATUS with patient    Family Communication:   Family not at  Bedside    Disposition Plan:   To home once workup is complete and patient is stable   Following barriers for discharge:                            Electrolytes corrected                                                           Pain controlled with PO medications                                                         Will need consultants to evaluate patient prior to discharge                        Consults called:     Cardiology is made aware by ER will see in AM I sent msg to Dr. Juleen China with nephrology plant to HD in AM  Admission status:  ED Disposition    None      Obs     Level of care  progressive   Lab Results  Component Value Date   [place] 12/24/2019     Precautions: admitted as asymptomatic screening protocol    PPE: Used by the provider:   N95  eye Goggles,  Gloves      Kaleya Douse 01/06/2020, 10:55 PM    [facility]     after 2 AM please page floor coverage PA If 7AM-7PM, please contact the day team taking care  of the patient using [email or web address]   Patient was evaluated in the context of the global COVID-19 pandemic, which necessitated consideration that  the patient might be at risk for infection with the SARS-CoV-2 virus that causes COVID-19. Institutional protocols and algorithms that pertain to the evaluation of patients at risk for COVID-19 are in a state of rapid change based on information released by regulatory bodies including the CDC and federal and state organizations. These policies and algorithms were followed during the patient's care.

## 2020-01-06 NOTE — ED Triage Notes (Signed)
Pt comes into the ED via EMS from dialysis with c/o chest pain that started about 1hr ago during dialysis, given nitro at dialysis and had some relief and pain returned , EMS gave another nitro and pt had relief again.. VSS 189 /83, HR 100-112, 94%RA on 2L [place]

## 2020-01-06 NOTE — ED Notes (Signed)
Pharmacy contacted by this nurse to request medication verification and time adjustments for administration.

## 2020-01-06 NOTE — ED Notes (Signed)
Pt complains of unchanged pain, this nurse requests from pharmacy for PRN med to ED

## 2020-01-06 NOTE — Consult Note (Signed)
ANTICOAGULATION CONSULT NOTE - Initial Consult  Pharmacy Consult for Heparin drip Indication: chest pain/ACS/STEMI  Allergies  Allergen Reactions  . Penicillins Hives  . Sulfa Antibiotics Hives    Patient Measurements: Height: 6' (182.9 cm) Weight: 83.9 kg (185 lb) IBW/kg (Calculated) : 77.6 Heparin Dosing Weight: 83.9kg  Vital Signs: Temp: 98.1 F (36.7 C) (07/21 1716) Temp Source: Oral (07/21 1716) BP: 125/68 (07/21 1716) Pulse Rate: 99 (07/21 1930)  Labs: Recent Labs    01/06/20 1405 01/06/20 1716  HGB 8.0*  --   HCT 26.7*  --   PLT 204  --   CREATININE 3.70*  --   TROPONINIHS 198* 219*    Estimated Creatinine Clearance: 17.2 mL/min (A) (by C-G formula based on SCr of 3.7 mg/dL (H)).   Medical History: Past Medical History:  Diagnosis Date  . Acquired phimosis   . BPH (benign prostatic hypertrophy)   . Chronic kidney disease   . Dyspnea    with exertion  . Elevated PSA   . GERD (gastroesophageal reflux disease)   . Gross hematuria   . Hematuria   . History of hiatal hernia   . Hypertension   . 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 Renal mass, left   . Symptomatic anemia 11/21/2017    Medications:  No PTA anticoagulant of record  Assessment: 79 yo male with ESRD on HD-MWF with prior hx of CAD post CABG.    Pt presenting with chest pain.    Pharmacy has been consulted to initiate and monitor a heparin drip.  Goal of Therapy:  Heparin level 0.3-0.7 units/ml Monitor platelets by anticoagulation protocol: Yes   Plan:  Will obtain baseline APTT and INR labs.  Give 4000 units bolus x 1 Start heparin infusion at 1100 units/hr Check anti-Xa level in 8 hours and daily while on heparin Continue to monitor H&H and platelets \ (Currently HGB 8.0, plt 204)  Lu Duffel, PharmD, BCPS Clinical Pharmacist 01/06/2020 8:01 PM

## 2020-01-06 NOTE — ED Provider Notes (Signed)
[facility] Emergency Department Provider Note   ____________________________________________   First MD Initiated Contact with Patient 01/06/20 1723     (approximate)  I have reviewed the triage vital signs and the nursing notes.   HISTORY  Chief Complaint Chest Pain    HPI Arthur Bowen is a 79 y.o. male with past medical history of CAD status post CABG, hypertension, hyperlipidemia, FSGS, and ESRD on HD who presents to the ED complaining of chest pain.  Patient was recently transitioned to hemodialysis during hospital admission, which she has been receiving on Mondays, Wednesdays, and Fridays without issue.  He had his regularly scheduled dialysis appointment today but started having tightness in his chest while undergoing treatment.  Dialysis treatment was stopped and he was given a dose of nitroglycerin, after which pain improved.  A few minutes later the pain seemed to come back and EMS was called.  He was given a second dose of nitroglycerin, after which chest pain has resolved.  He states he has been feeling otherwise well with no fevers, cough, or shortness of breath.  He has a right IJ TDC has been functioning without issue.        Past Medical History:  Diagnosis Date  . Acquired phimosis   . BPH (benign prostatic hypertrophy)   . Chronic kidney disease   . Dyspnea    with exertion  . Elevated PSA   . GERD (gastroesophageal reflux disease)   . Gross hematuria   . Hematuria   . History of hiatal hernia   . Hypertension   . 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 Renal mass, left   . Symptomatic anemia 11/21/2017    Patient Active Problem List   Diagnosis Date Noted  . Acute hypoxemic respiratory failure ([place]) 12/26/2019  . Shortness of breath 12/24/2019  . B12 deficiency 08/06/2018  . Anemia due to stage 4 chronic kidney disease ([place]) 07/24/2018  . Bradycardia 06/05/2018  . Symptomatic anemia 11/21/2017  . Secondary  hyperparathyroidism of renal origin ([place]) 10/15/2017  . CKD (chronic kidney disease) stage 5, GFR less than 15 ml/min (HCC) 10/09/2017  . Primary osteoarthritis of right knee 01/09/2017  . Renal mass 08/14/2015  . BPH with obstruction/lower urinary tract symptoms 08/14/2015  . History of hematuria 08/14/2015  . Coronary artery disease involving native coronary artery of native heart with angina pectoris ([place]) 04/06/2015  . Dyslipidemia 04/06/2015  . Essential (primary) hypertension 04/06/2015  . Neuropathy 04/06/2015  . Peripheral vascular disease ([place]) 04/06/2015  . 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 Senile purpura ([place]) 04/06/2015    Past Surgical History:  Procedure Laterality Date  . AV FISTULA PLACEMENT Left 11/18/2019   Procedure: ARTERIOVENOUS (AV) FISTULA CREATION (BRACHIAL CEPHALIC);  Surgeon: Katha Cabal, MD;  Location: [facility] ORS;  Service: Vascular;  Laterality: Left;  . Circumscision  09/2015  . CORONARY ARTERY BYPASS GRAFT  2002   x3 @ [place]  . DIALYSIS/PERMA CATHETER INSERTION N/A 12/25/2019   Procedure: DIALYSIS/PERMA CATHETER INSERTION;  Surgeon: Katha Cabal, MD;  Location: [place] [facility];  Service: Cardiovascular;  Laterality: N/A;  . DIALYSIS/PERMA CATHETER INSERTION N/A 12/28/2019   Procedure: DIALYSIS/PERMA CATHETER INSERTION;  Surgeon: Algernon Huxley, MD;  Location: [place] [facility];  Service: Cardiovascular;  Laterality: N/A;  . INGUINAL HERNIA REPAIR Left 2011  . RENAL BIOPSY  2014   neg in w/u renal mass/hematuria    Prior to Admission medications   Medication  Sig Start Date End Date Taking? Authorizing Provider  aspirin 81 MG tablet Take 1 tablet by mouth daily. Reported on 09/02/2015   Yes [provider]  atorvastatin (LIPITOR) 40 MG tablet Take 1 tablet by mouth once daily 08/13/19  Yes Glean Hess, MD  cyanocobalamin (,VITAMIN B-12,) 1000 MCG/ML injection INJECT 1,000 MCG (1ML) AS DIRECTED EVERY 30 DAYS  01/04/20  Yes Corcoran, Drue Second, MD  famotidine (PEPCID) 20 MG tablet Take 1 tablet (20 mg total) by mouth daily. 08/19/19  Yes Glean Hess, MD  finasteride (PROSCAR) 5 MG tablet Take 1 tablet (5 mg total) by mouth daily. 08/19/19  Yes Glean Hess, MD  VELTASSA 8.4 g packet Take 1 packet by mouth daily. 12/31/19  Yes [provider]  nitroGLYCERIN (NITROSTAT) 0.4 MG SL tablet PLACE 1 TABLET UNDER THE TONGUE EVERY 5 MINUTES AS NEEDED FOR CHEST PAIN 11/28/19   Glean Hess, MD    Allergies Penicillins and Sulfa antibiotics  Family History  Problem Relation Age of Onset  . Lymphoma Mother   . Hodgkin's lymphoma Mother   . Lung cancer Father   . Heart disease Brother   . Dementia Sister   . Dementia Brother   . Breast cancer Maternal Aunt     Social History Social History   Tobacco Use  . Smoking status: Former Smoker    Packs/day: 0.50    Years: 50.00    Pack years: 25.00    Types: Cigarettes    Quit date: 10/10/2017    Years since quitting: 2.2  . Smokeless tobacco: Former Systems developer    Types: Chew  . Tobacco comment: last cig 10/10/17   Vaping Use  . Vaping Use: Never used  Substance Use Topics  . Alcohol use: No    Alcohol/week: 0.0 standard drinks  . Drug use: No    Review of Systems  Constitutional: No fever/chills Eyes: No visual changes. ENT: No sore throat. Cardiovascular: Positive for chest pain. Respiratory: Denies shortness of breath. Gastrointestinal: No abdominal pain.  No nausea, no vomiting.  No diarrhea.  No constipation. Genitourinary: Negative for dysuria. Musculoskeletal: Negative for back pain. Skin: Negative for rash. Neurological: Negative for headaches, focal weakness or numbness.  ____________________________________________   PHYSICAL EXAM:  VITAL SIGNS: ED Triage Vitals  Enc Vitals Group     BP 01/06/20 1404 102/87     Pulse Rate 01/06/20 1404 95     Resp 01/06/20 1404 20     Temp 01/06/20 1404 98.1 F (36.7 C)      Temp Source 01/06/20 1404 Oral     SpO2 01/06/20 1404 100 %     Weight 01/06/20 1405 185 lb (83.9 kg)     Height 01/06/20 1405 6' (1.829 m)     Head Circumference --      Peak Flow --      Pain Score 01/06/20 1411 1     Pain Loc --      Pain Edu? --      Excl. in [place]? --     Constitutional: Alert and oriented. Eyes: Conjunctivae are normal. Head: Atraumatic. Nose: No congestion/rhinnorhea. Mouth/Throat: Mucous membranes are moist. Neck: Normal ROM Cardiovascular: Normal rate, regular rhythm. Grossly normal heart sounds.  Right IJ TDC site clean, dry, and intact.  Developing left upper extremity fistula with palpable thrill. Respiratory: Normal respiratory effort.  No retractions. Lungs CTAB. Gastrointestinal: Soft and nontender. No distention. Genitourinary: deferred Musculoskeletal: No lower extremity tenderness nor edema. Neurologic:  Normal speech and language. No gross focal neurologic deficits are appreciated. Skin:  Skin is warm, dry and intact. No rash noted. Psychiatric: Mood and affect are normal. Speech and behavior are normal.  ____________________________________________   LABS (all labs ordered are listed, but only abnormal results are displayed)  Labs Reviewed  BASIC METABOLIC PANEL - Abnormal; Notable for the following components:      Result Value   Chloride 95 (*)    Glucose, Bld 100 (*)    Creatinine, Ser 3.70 (*)    Calcium 8.7 (*)    GFR calc non Af Amer 14 (*)    GFR calc Af Amer 17 (*)    Anion gap 16 (*)    All other components within normal limits  CBC - Abnormal; Notable for the following components:   RBC 2.91 (*)    Hemoglobin 8.0 (*)    HCT 26.7 (*)    RDW 16.6 (*)    All other components within normal limits  TROPONIN I (HIGH SENSITIVITY) - Abnormal; Notable for the following components:   Troponin I (High Sensitivity) 198 (*)    All other components within normal limits  TROPONIN I (HIGH SENSITIVITY) - Abnormal; Notable for the  following components:   Troponin I (High Sensitivity) 219 (*)    All other components within normal limits  SARS CORONAVIRUS 2 (TAT 6-24 HRS)  BRAIN NATRIURETIC PEPTIDE   ____________________________________________  EKG  ED ECG REPORT I, Blake Divine, the attending physician, personally viewed and interpreted this ECG.   Date: 01/06/2020  EKG Time: 17:55  Rate: 98  Rhythm: normal sinus rhythm  Axis: LAD  Intervals:right bundle branch block and left anterior fascicular block  ST&T Change: None   PROCEDURES  Procedure(s) performed (including Critical Care):  Procedures   ____________________________________________   INITIAL IMPRESSION / ASSESSMENT AND PLAN / ED COURSE       79 year old male with past medical history of CAD status post CABG, ESRD on HD, hypertension, hyperlipidemia, and FSGS who presents to the ED complaining of sewed of chest pain while undergoing dialysis treatment today.  Symptoms persisted after dialysis was stopped, however pain has now resolved following his second dose of nitroglycerin.  EKG shows no evidence of arrhythmia or acute ischemia, chest x-ray shows small pleural effusions but no explanation for his chest pain.  Lab work remarkable for elevated troponin, potentially related to patient's ESRD although no prior baseline for comparison.  Repeat troponin is mildly uptrending and given patient's high risk status, plan for admission for observation.  Case discussed with hospitalist for admission, who requests I consult cardiology.  I spoke with Dr. Nehemiah Massed, who recommends starting patient on heparin drip for now and cardiology team will evaluate in the morning.  Heparin drip was ordered and hospitalist was updated.      ____________________________________________   FINAL CLINICAL IMPRESSION(S) / ED DIAGNOSES  Final diagnoses:  Nonspecific chest pain  ESRD on dialysis [facility])     ED Discharge Orders    None       Note:  This  document was prepared using Dragon voice recognition software and may include unintentional dictation errors.   Blake Divine, MD 01/06/20 [phone]

## 2020-01-06 NOTE — ED Notes (Signed)
Pt reports decrease in pain at this time, will attempt to sleep

## 2020-01-06 NOTE — ED Notes (Addendum)
Pt provided with sandwich tray and ginger ale per request

## 2020-01-06 NOTE — ED Notes (Signed)
Pt placed on 2LPM via [place] at this time due to sats at 88-91% on RA

## 2020-01-06 NOTE — ED Notes (Signed)
Rainbow sent to the lab.  

## 2020-01-06 NOTE — ED Notes (Signed)
Pt complaining of chest pain, reports same as had come earlier. EKG completed and troponin drawn. Dr Roel Cluck notified and goes into room. Pt pain is reproducible with palpation on chest.

## 2020-01-07 ENCOUNTER — Other Ambulatory Visit: Payer: Self-pay

## 2020-01-07 ENCOUNTER — Encounter: Payer: Self-pay | Admitting: Internal Medicine

## 2020-01-07 ENCOUNTER — Observation Stay
Admit: 2020-01-07 | Discharge: 2020-01-07 | Disposition: A | Discharge status: Home / Self Care | Payer: Medicare Other | Attending: Internal Medicine | Admitting: Internal Medicine

## 2020-01-07 DIAGNOSIS — N2581 Secondary hyperparathyroidism of renal origin: Secondary | ICD-10-CM | POA: Diagnosis not present

## 2020-01-07 DIAGNOSIS — R079 Chest pain, unspecified: Secondary | ICD-10-CM | POA: Diagnosis not present

## 2020-01-07 DIAGNOSIS — D631 Anemia in chronic kidney disease: Secondary | ICD-10-CM | POA: Diagnosis not present

## 2020-01-07 DIAGNOSIS — I120 Hypertensive chronic kidney disease with stage 5 chronic kidney disease or end stage renal disease: Secondary | ICD-10-CM | POA: Diagnosis not present

## 2020-01-07 DIAGNOSIS — I5043 Acute on chronic combined systolic (congestive) and diastolic (congestive) heart failure: Secondary | ICD-10-CM | POA: Diagnosis not present

## 2020-01-07 DIAGNOSIS — N186 End stage renal disease: Secondary | ICD-10-CM | POA: Diagnosis not present

## 2020-01-07 DIAGNOSIS — N185 Chronic kidney disease, stage 5: Secondary | ICD-10-CM

## 2020-01-07 LAB — RENAL FUNCTION PANEL
Albumin: 2.9 g/dL — ABNORMAL LOW (ref 3.5–5.0)
Anion gap: 14 (ref 5–15)
BUN: 29 mg/dL — ABNORMAL HIGH (ref 8–23)
CO2: 25 mmol/L (ref 22–32)
Calcium: 8.6 mg/dL — ABNORMAL LOW (ref 8.9–10.3)
Chloride: 96 mmol/L — ABNORMAL LOW (ref 98–111)
Creatinine, Ser: 4.7 mg/dL — ABNORMAL HIGH (ref 0.61–1.24)
GFR calc Af Amer: 13 mL/min — ABNORMAL LOW (ref >60)
GFR calc non Af Amer: 11 mL/min — ABNORMAL LOW (ref >60)
Glucose, Bld: 149 mg/dL — ABNORMAL HIGH (ref 70–99)
Phosphorus: 4.8 mg/dL — ABNORMAL HIGH (ref 2.5–4.6)
Potassium: 4.2 mmol/L (ref 3.5–5.1)
Sodium: 135 mmol/L (ref 135–145)

## 2020-01-07 LAB — CBC
HCT: 27.2 % — ABNORMAL LOW (ref 39.0–52.0)
Hemoglobin: 8.1 g/dL — ABNORMAL LOW (ref 13.0–17.0)
MCH: 27.5 pg (ref 26.0–34.0)
MCHC: 29.8 g/dL — ABNORMAL LOW (ref 30.0–36.0)
MCV: 92.2 fL (ref 80.0–100.0)
Platelets: 190 10*3/uL (ref 150–400)
RBC: 2.95 MIL/uL — ABNORMAL LOW (ref 4.22–5.81)
RDW: 16.6 % — ABNORMAL HIGH (ref 11.5–15.5)
WBC: 7.3 10*3/uL (ref 4.0–10.5)
nRBC: 0 % (ref 0.0–0.2)

## 2020-01-07 LAB — MRSA PCR SCREENING: MRSA by PCR: NEGATIVE

## 2020-01-07 LAB — SARS CORONAVIRUS 2 (TAT 6-24 HRS): SARS Coronavirus 2: NEGATIVE

## 2020-01-07 LAB — LIPID PANEL
Cholesterol: 121 mg/dL (ref 0–200)
HDL: 27 mg/dL — ABNORMAL LOW (ref >40)
LDL Cholesterol: 64 mg/dL (ref 0–99)
Total CHOL/HDL Ratio: 4.5 RATIO
Triglycerides: 151 mg/dL — ABNORMAL HIGH (ref <150)
VLDL: 30 mg/dL (ref 0–40)

## 2020-01-07 LAB — HEPARIN LEVEL (UNFRACTIONATED)
Heparin Unfractionated: 0.36 IU/mL (ref 0.30–0.70)
Heparin Unfractionated: 0.55 IU/mL (ref 0.30–0.70)

## 2020-01-07 LAB — HEMOGLOBIN A1C
Hgb A1c MFr Bld: 4.8 % (ref 4.8–5.6)
Mean Plasma Glucose: 91.06 mg/dL

## 2020-01-07 LAB — TSH: TSH: 1.972 u[IU]/mL (ref 0.350–4.500)

## 2020-01-07 LAB — TROPONIN I (HIGH SENSITIVITY): Troponin I (High Sensitivity): 226 ng/L (ref <18)

## 2020-01-07 MED ORDER — FUROSEMIDE 10 MG/ML IJ SOLN
40.0000 mg | Freq: Two times a day (BID) | INTRAMUSCULAR | Status: DC
  Administered 2020-01-07 – 2020-01-08 (×2): 40 mg via INTRAVENOUS
  Filled 2020-01-07 (×3): qty 4

## 2020-01-07 MED ORDER — ISOSORBIDE MONONITRATE ER 30 MG PO TB24
30.0000 mg | ORAL_TABLET | Freq: Every day | ORAL | Status: DC
  Administered 2020-01-07 – 2020-01-08 (×2): 30 mg via ORAL
  Filled 2020-01-07 (×2): qty 1

## 2020-01-07 NOTE — Consult Note (Deleted)
ANTICOAGULATION CONSULT NOTE - Initial Consult  Pharmacy Consult for Heparin drip Indication: chest pain/ACS/STEMI  Allergies  Allergen Reactions  . Penicillins Hives  . Sulfa Antibiotics Hives   Patient Measurements: Height: 6' (182.9 cm) Weight: 83.9 kg (185 lb) IBW/kg (Calculated) : 77.6 Heparin Dosing Weight: 83.9kg  Vital Signs: BP: 121/74 (07/22 0900) Pulse Rate: 99 (07/22 0900)  Labs: Recent Labs    01/06/20 1405 01/06/20 1405 01/06/20 1716 01/06/20 2005 01/06/20 2148 01/07/20 0006 01/07/20 0540 01/07/20 1408  HGB 8.0*  --   --   --   --   --   --  8.1*  HCT 26.7*  --   --   --   --   --   --  27.2*  PLT 204  --   --   --   --   --   --  190  APTT  --   --   --  31  --   --   --   --   LABPROT  --   --   --  12.3  --   --   --   --   INR  --   --   --  1.0  --   --   --   --   HEPARINUNFRC  --   --   --   --   --   --  0.36 0.55  CREATININE 3.70*  --   --   --   --   --   --   --   TROPONINIHS 198*   < > 219*  --  215* 226*  --   --    < > = values in this interval not displayed.    Estimated Creatinine Clearance: 17.2 mL/min (A) (by C-G formula based on SCr of 3.7 mg/dL (H)).   Medical History: Past Medical History:  Diagnosis Date  . Acquired phimosis   . BPH (benign prostatic hypertrophy)   . Chronic kidney disease   . Dyspnea    with exertion  . Elevated PSA   . GERD (gastroesophageal reflux disease)   . Gross hematuria   . Hematuria   . History of hiatal hernia   . Hypertension   . 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 Renal mass, left   . Symptomatic anemia 11/21/2017    Medications:  No PTA anticoagulant of record  Assessment: 79 yo male with ESRD on HD-MWF with prior hx of CAD post CABG.  Pt presenting with chest pain.  Pharmacy has been consulted to initiate and monitor a heparin drip.  7/21 Heparin infusion initiated at 1100 units/hr.  0722 0540 HL 0.36, therapeutic x 1.   Goal of Therapy:  Heparin level 0.3-0.7  units/ml Monitor platelets by anticoagulation protocol: Yes   Plan:  0722 1408 HL 0.55, therapeutic x 1. Hgb stable at 8.1. Will continue Heparin at current rate of 1100 units/hr.  Continue to check HL and CBC daily while on heparin infusion.   Pernell Dupre, PharmD, BCPS Clinical Pharmacist 01/07/2020 2:46 PM

## 2020-01-07 NOTE — Consult Note (Signed)
CARDIOLOGY CONSULT NOTE               Patient ID: Arthur Bowen MRN: [id number] DOB/AGE: [birth year] 79 y.o.  Admit date: 01/06/2020 Referring Physician Dr. Roel Cluck  Primary Physician Dr. Halina Maidens  Primary Cardiologist Dr. Bartholome Bill  Reason for Consultation Chest pain, elevated troponin   HPI: [name] is an 79 year old male with a past medical history significant for coronary artery disease s/p CABG x 3 with a LIMA to the LAD, SVG to OM, and SVG to PD at [place], ESRD on HD, FSGS, hyperlipidemia, and hypertension who presented to the ED on 01/06/20 for an acute onset of chest pain that developed during dialysis treatment.  He was given nitroglycerin with resolution in symptoms, but developed CP again and was advised to present to the ED for further evaluation.  Workup in the ED has been significant for high sensitivity troponin 198, 219, 215, and 226 respectively, BNP 1954, chest xray revealing small bilateral pleural effusions, right greater than left, and ECG revealing sinus tachycardia, rate of 101bpm, with a RBBB.   He is currently chest pain free and denies palpitations, shortness of breath, lower extremity swelling, orthopnea, or PND.    He is followed in outpatient cardiology by Dr. Ubaldo Glassing. Most recent left heart catheterization through [facility] in 2013 revealed patent grafts.  Stress test in 2020 revealed normal LV function with no evidence of ischemia.   Review of systems complete and found to be negative unless listed above     Past Medical History:  Diagnosis Date  . Acquired phimosis   . BPH (benign prostatic hypertrophy)   . Chronic kidney disease   . Dyspnea    with exertion  . Elevated PSA   . GERD (gastroesophageal reflux disease)   . Gross hematuria   . Hematuria   . History of hiatal hernia   . Hypertension   . 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 Renal mass, left   . Symptomatic anemia 11/21/2017    Past Surgical History:  Procedure  Laterality Date  . AV FISTULA PLACEMENT Left 11/18/2019   Procedure: ARTERIOVENOUS (AV) FISTULA CREATION (BRACHIAL CEPHALIC);  Surgeon: Katha Cabal, MD;  Location: [facility] ORS;  Service: Vascular;  Laterality: Left;  . Circumscision  09/2015  . CORONARY ARTERY BYPASS GRAFT  2002   x3 @ [place]  . DIALYSIS/PERMA CATHETER INSERTION N/A 12/25/2019   Procedure: DIALYSIS/PERMA CATHETER INSERTION;  Surgeon: Katha Cabal, MD;  Location: [place] [facility];  Service: Cardiovascular;  Laterality: N/A;  . DIALYSIS/PERMA CATHETER INSERTION N/A 12/28/2019   Procedure: DIALYSIS/PERMA CATHETER INSERTION;  Surgeon: Algernon Huxley, MD;  Location: [place] [facility];  Service: Cardiovascular;  Laterality: N/A;  . INGUINAL HERNIA REPAIR Left 2011  . RENAL BIOPSY  2014   neg in w/u renal mass/hematuria    (Not in a hospital admission)  Social History   Socioeconomic History  . Marital status: Divorced    Spouse name: Not on file  . Number of children: 2  . Years of education: Not on file  . Highest education level: Not on file  Occupational History  . Not on file  Tobacco Use  . Smoking status: Former Smoker    Packs/day: 0.50    Years: 50.00    Pack years: 25.00    Types: Cigarettes    Quit date: 10/10/2017    Years since quitting: 2.2  . Smokeless tobacco: Former Systems developer  Types: Chew  . Tobacco comment: last cig 10/10/17   Vaping Use  . Vaping Use: Never used  Substance and Sexual Activity  . Alcohol use: No    Alcohol/week: 0.0 standard drinks  . Drug use: No  . Sexual activity: Not Currently  Other Topics Concern  . Not on file  Social History Narrative  . Not on file   Social Determinants of Health   Financial Resource Strain:   . Difficulty of Paying Living Expenses:   Food Insecurity:   . Worried About Charity fundraiser in the Last Year:   . Arboriculturist in the Last Year:   Transportation Needs:   . Film/video editor (Medical):   Marland Kitchen Lack of Transportation  (Non-Medical):   Physical Activity:   . Days of Exercise per Week:   . Minutes of Exercise per Session:   Stress:   . Feeling of Stress :   Social Connections:   . Frequency of Communication with Friends and Family:   . Frequency of Social Gatherings with Friends and Family:   . Attends Religious Services:   . Active Member of Clubs or Organizations:   . Attends Archivist Meetings:   Marland Kitchen Marital Status:   Intimate Partner Violence:   . Fear of Current or Ex-Partner:   . Emotionally Abused:   Marland Kitchen Physically Abused:   . Sexually Abused:     Family History  Problem Relation Age of Onset  . Lymphoma Mother   . Hodgkin's lymphoma Mother   . Lung cancer Father   . Heart disease Brother   . Dementia Sister   . Dementia Brother   . Breast cancer Maternal Aunt       Review of systems complete and found to be negative unless listed above      PHYSICAL EXAM  General: Well developed, well nourished, in no acute distress HEENT:  Normocephalic and atramatic Neck:  No JVD.  Lungs: Clear bilaterally to auscultation and percussion. Heart: HRRR . Normal S1 and S2 without gallops or murmurs.  Abdomen: Bowel sounds are positive, abdomen soft and non-tender  Msk:  Back normal.  Normal strength and tone for age. Extremities: No clubbing, cyanosis or edema.   Neuro: Alert and oriented X 3. Psych:  Good affect, responds appropriately  Labs:   Lab Results  Component Value Date   WBC 5.6 01/06/2020   HGB 8.0 (L) 01/06/2020   HCT 26.7 (L) 01/06/2020   MCV 91.8 01/06/2020   PLT 204 01/06/2020    Recent Labs  Lab 01/06/20 1405  NA 137  K 3.6  CL 95*  CO2 26  BUN 21  CREATININE 3.70*  CALCIUM 8.7*  GLUCOSE 100*   Lab Results  Component Value Date   TROPONINI 1.02 (HH) 06/07/2018    Lab Results  Component Value Date   CHOL 121 01/07/2020   CHOL 142 08/19/2019   CHOL 124 06/05/2018   Lab Results  Component Value Date   HDL 27 (L) 01/07/2020   HDL 39 (L)  08/19/2019   HDL 40 06/05/2018   Lab Results  Component Value Date   LDLCALC 64 01/07/2020   LDLCALC 79 08/19/2019   LDLCALC 52 06/05/2018   Lab Results  Component Value Date   TRIG 151 (H) 01/07/2020   TRIG 138 08/19/2019   TRIG 158 (H) 06/05/2018   Lab Results  Component Value Date   CHOLHDL 4.5 01/07/2020   CHOLHDL 3.6 08/19/2019  CHOLHDL 3.1 06/05/2018   No results found for: LDLDIRECT    Radiology: DG Chest 2 View  Result Date: 01/06/2020 CLINICAL DATA:  Left-sided chest pain EXAM: CHEST - 2 VIEW COMPARISON:  12/24/2019 FINDINGS: Interval placement right internal jugular approach hemodialysis catheter with distal tip terminating level of the superior cavoatrial junction. Stable heart size. Atherosclerotic calcification of the aortic knob. Small bilateral pleural effusions, right greater than left. Streaky bibasilar opacities, favor atelectasis. Pneumothorax. IMPRESSION: 1. Small bilateral pleural effusions, right greater than left. 2. Streaky bibasilar opacities, favor atelectasis. Electronically Signed   By: Davina Poke D.O.   On: 01/06/2020 15:01   X-ray chest PA and lateral  Result Date: 12/24/2019 CLINICAL DATA:  Shortness of breath. EXAM: CHEST - 2 VIEW COMPARISON:  06/05/2018 FINDINGS: Median sternotomy. Cardiomegaly with aortic atherosclerosis. Unchanged mediastinal contours. Bilateral pleural effusions, moderate on the right and small on the left. Vascular congestion. No pneumothorax. Multilevel degenerative change in the spine. IMPRESSION: 1. Bilateral pleural effusions, moderate on the right and small on the left. Vascular congestion. Suspect an CHF/fluid overload. 2. Cardiomegaly with aortic atherosclerosis. Electronically Signed   By: Keith Rake M.D.   On: 12/24/2019 20:49   PERIPHERAL VASCULAR CATHETERIZATION  Result Date: 12/28/2019 See op note  PERIPHERAL VASCULAR CATHETERIZATION  Result Date: 12/25/2019 See op note   EKG: Sinus tachycardia at a  ventricular rate of 101bpm; RBBB; no obvious evidence of acute ischemia   ASSESSMENT AND PLAN:  1.  Chest pain/elevated troponin   -Elevated with no significant delta; patient is currently chest pain free and has responded well to nitroglycerin   -Will attempt to treat medically and add Imdur 30mg  daily with close monitoring of BP  -Continue aspirin 81mg  and atorvastatin 40mg    2. ESRD on HD  -Nephrology evaluated; HD planned for tomorrow  3. Hyperlipidemia   -Continue atorvastatin 40mg  daily   The history, physical exam findings, and plan of care were all discussed with Dr. Bartholome Bill, and all decision making was made in collaboration.   Signed: Avie Arenas PA-C 01/07/2020, 8:47 AM

## 2020-01-07 NOTE — ED Notes (Signed)
Pt on 2L o2 sats 95% at rest. During assessment pt sats dropped to 88% without any exertion or activity. Pt was awake o2 in proper placement. Placed pt on 3L for brief time and quickly raised him to 96%. Pt was able to be placed on 2L o2 prior to end of assessment and was maintaing above 90% average around 95%

## 2020-01-07 NOTE — ED Notes (Signed)
Pt to toilet for BM at this time, assisted by Raquel, RN

## 2020-01-07 NOTE — ED Notes (Signed)
Patient observed resting in bed. Denies pain at this time. Assisted to bathroom. BM noted. Dressed out into hospital gown. No acute distress at this time.

## 2020-01-07 NOTE — ED Notes (Signed)
Pt ambulates to toilet at this time to urinate, returned to bed and assisted to adjust bed to position of comfort.

## 2020-01-07 NOTE — Consult Note (Signed)
ANTICOAGULATION CONSULT NOTE - Initial Consult  Pharmacy Consult for Heparin drip Indication: chest pain/ACS/STEMI  Allergies  Allergen Reactions   Penicillins Hives   Sulfa Antibiotics Hives   Patient Measurements: Height: 6' (182.9 cm) Weight: 83.9 kg (185 lb) IBW/kg (Calculated) : 77.6 Heparin Dosing Weight: 83.9kg  Vital Signs: BP: 107/73 (07/22 0630) Pulse Rate: 95 (07/22 0630)  Labs: Recent Labs    01/06/20 1405 01/06/20 1405 01/06/20 1716 01/06/20 2005 01/06/20 2148 01/07/20 0006 01/07/20 0540  HGB 8.0*  --   --   --   --   --   --   HCT 26.7*  --   --   --   --   --   --   PLT 204  --   --   --   --   --   --   APTT  --   --   --  31  --   --   --   LABPROT  --   --   --  12.3  --   --   --   INR  --   --   --  1.0  --   --   --   HEPARINUNFRC  --   --   --   --   --   --  0.36  CREATININE 3.70*  --   --   --   --   --   --   TROPONINIHS 198*   < > 219*  --  215* 226*  --    < > = values in this interval not displayed.    Estimated Creatinine Clearance: 17.2 mL/min (A) (by C-G formula based on SCr of 3.7 mg/dL (H)).   Medical History: Past Medical History:  Diagnosis Date   Acquired phimosis    BPH (benign prostatic hypertrophy)    Chronic kidney disease    Dyspnea    with exertion   Elevated PSA    GERD (gastroesophageal reflux disease)    Gross hematuria    Hematuria    History of hiatal hernia    Hypertension    Myocardial infarction [facility])    CABG-triple   Renal mass, left    Symptomatic anemia 11/21/2017    Medications:  No PTA anticoagulant of record  Assessment: 79 yo male with ESRD on HD-MWF with prior hx of CAD post CABG.    Pt presenting with chest pain.    Pharmacy has been consulted to initiate and monitor a heparin drip.  Goal of Therapy:  Heparin level 0.3-0.7 units/ml Monitor platelets by anticoagulation protocol: Yes   Plan:  Will obtain baseline APTT and INR labs.  Give 4000 units bolus x 1 Start  heparin infusion at 1100 units/hr Check anti-Xa level in 8 hours and daily while on heparin Continue to monitor H&H and platelets \ (Currently HGB 8.0, plt 204)  0722 0540 HL 0.36, therapeutic x 1.  Will continue Heparin at current rate and recheck HL in 8 hours to confirm.  Ena Dawley, PharmD Clinical Pharmacist 01/07/2020 6:55 AM

## 2020-01-07 NOTE — Consult Note (Signed)
ANTICOAGULATION CONSULT NOTE  Pharmacy Consult for Heparin drip Indication: chest pain/ACS/STEMI  Patient Measurements: Height: 6' (182.9 cm) Weight: 83.9 kg (185 lb) IBW/kg (Calculated) : 77.6  Vital Signs: BP: 121/74 (07/22 0900) Pulse Rate: 99 (07/22 0900)  Labs: Recent Labs    01/06/20 1405 01/06/20 1405 01/06/20 1716 01/06/20 2005 01/06/20 2148 01/07/20 0006 01/07/20 0540 01/07/20 1408  HGB 8.0*  --   --   --   --   --   --  8.1*  HCT 26.7*  --   --   --   --   --   --  27.2*  PLT 204  --   --   --   --   --   --  190  APTT  --   --   --  31  --   --   --   --   LABPROT  --   --   --  12.3  --   --   --   --   INR  --   --   --  1.0  --   --   --   --   HEPARINUNFRC  --   --   --   --   --   --  0.36  --   CREATININE 3.70*  --   --   --   --   --   --   --   TROPONINIHS 198*   < > 219*  --  215* 226*  --   --    < > = values in this interval not displayed.    Estimated Creatinine Clearance: 17.2 mL/min (A) (by C-G formula based on SCr of 3.7 mg/dL (H)).   Medical History: Past Medical History:  Diagnosis Date   Acquired phimosis    BPH (benign prostatic hypertrophy)    Chronic kidney disease    Dyspnea    with exertion   Elevated PSA    GERD (gastroesophageal reflux disease)    Gross hematuria    Hematuria    History of hiatal hernia    Hypertension    Myocardial infarction [facility])    CABG-triple   Renal mass, left    Symptomatic anemia 11/21/2017    Assessment: 79 y.o. male with past medical history of CAD status post CABG, hypertension, hyperlipidemia, FSGS, and ESRD on HD who presents to the ED complaining of chest pain. Pharmacy was consulted to initiate and monitor a heparin drip for ACS/STEMI. H&H low at baseline but appear to be stable, platelets wnl, no chronic anticoagulation PTA  Heparin Course: 07/21 initiation: 1100 units/hr 07/22 0540 HL 0.36: no change 07/22 1408 HL 0.55: no change  Goal of Therapy:  Heparin level  0.3-0.7 units/ml Monitor platelets by anticoagulation protocol: Yes   Plan:   Anti-Xa level therapeutic: continue heparin infusion at 1100 units/hr  This is the 2nd consecutive therapeutic heparin level  Per protocol move to once daily anti-Xa levels  Check anti-Xa level daily while on heparin: next 7/23 am  Continue to monitor H&H and platelets: next CBC am 7/23  Dallie Piles, PharmD Clinical Pharmacist 01/07/2020 2:34 PM

## 2020-01-07 NOTE — ED Notes (Signed)
Breakfast tray provided. 

## 2020-01-07 NOTE — Progress Notes (Addendum)
Progress Note    Arthur Bowen  JHE:[id number] DOB: [birth year]  DOA: 01/06/2020 PCP: Glean Hess, MD      Brief Narrative:    Medical records reviewed and are as summarized below:  Arthur Bowen is a 79 y.o. male with medical history significant for CAD, CKD stage IV biopsy proven FSGS, CAD s/p CABG, BPH, hyperlipidemia, hypertension, left renal mass.  He was referred from the outpatient hemodialysis center because he developed chest pain/chest tightness after dialysis.  He was given nitroglycerin with some improvement in chest pain.  His daughter said there was some suspicion that chest pain/tightness may have been due to too much fluid being pulled off at dialysis.  His troponins were mildly elevated there was no significant change in troponins.  He was seen in consultation by the cardiologist recommended conservative management in addition of Imdur.  He was also seen by the nephrologist because of ESRD.  He was treated with IV Lasix for acute on chronic diastolic CHF/fluid overload.      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  Patient evaluated by cardiologist who recommended conservative management and addition of Imdur.  Continue aspirin and Lipitor.  ESRD on HD: Plan for hemodialysis tomorrow  Acute on chronic diastolic CHF: Start IV Lasix.  Monitor BMP and urine output.  Discussed with nephrologist, Dr. Juleen China.  Acute hypoxemic respiratory failure: Continue 2 L/min oxygen via nasal cannula    Body mass index is 25.09 kg/m.  Diet Order             Diet renal with fluid restriction Fluid restriction: 1200 mL Fluid; Room service appropriate? Yes; Fluid consistency: Thin  Diet effective now                       Medications:   . aspirin EC  81 mg Oral Daily  . atorvastatin  40 mg Oral Daily  . famotidine  20 mg Oral Daily  . finasteride  5 mg Oral Daily  . furosemide  40 mg Intravenous BID  . isosorbide mononitrate  30 mg Oral Daily   Continuous Infusions: . heparin 1,100 Units/hr (01/07/20 0533)  . methocarbamol (ROBAXIN) IV Stopped (01/07/20 0001)     Anti-infectives (From admission, onward)   None             Family Communication/Anticipated D/C date and plan/Code Status   DVT prophylaxis:      Code Status: Full Code  Family Communication: Discussed with his daughter at bedside Disposition Plan:    Status is: Observation  The patient will require care spanning > 2 midnights and should be moved to inpatient because: IV treatments appropriate due to intensity of illness or inability to take PO and Inpatient level of care appropriate due to severity of illness  Dispo: The patient is from: Home              Anticipated d/c is to: Home              Anticipated d/c date is: 1 day  Patient currently is not medically stable to d/c.           Subjective:   Overnight events noted.  Oxygen saturation dropped to 88%.  C/o shortness of breath and wheezing.  No chest pain.  Objective:    Vitals:   01/07/20 0532 01/07/20 0600 01/07/20 0630 01/07/20 0900  BP: 125/75 126/71 107/73 121/74  Pulse: 98 99 95 99  Resp: (!) 25 20 20    Temp:      TempSrc:      SpO2: 95% 96% 96% 100%  Weight:      Height:       No data found.  No intake or output data in the 24 hours ending 01/07/20 1421 Filed Weights   01/06/20 1405  Weight: 83.9 kg    Exam:  GEN: NAD SKIN: No rash EYES: EOMI ENT: MMM CV: RRR PULM: Bilateral expiratory wheezing, bilateral rales ABD: soft,  ND, NT, +BS CNS: AAO x 3, non focal EXT: No edema or tenderness   Data Reviewed:   I have personally reviewed following labs and imaging studies:  Labs: Labs show the following:   Basic Metabolic Panel: Recent Labs  Lab 01/06/20 1405  NA 137  K 3.6  CL 95*  CO2 26  GLUCOSE 100*  BUN 21  CREATININE 3.70*  CALCIUM 8.7*   GFR Estimated Creatinine Clearance: 17.2 mL/min (A) (by C-G formula based on SCr of 3.7 mg/dL (H)). Liver Function Tests: No results for input(s): AST, ALT, ALKPHOS, BILITOT, PROT, ALBUMIN in the last 168 hours. No results for input(s): LIPASE, AMYLASE in the last 168 hours. No results for input(s): AMMONIA in the last 168 hours. Coagulation profile Recent Labs  Lab 01/06/20 2005  INR 1.0    CBC: Recent Labs  Lab 01/06/20 1405 01/07/20 1408  WBC 5.6 7.3  HGB 8.0* 8.1*  HCT 26.7* 27.2*  MCV 91.8 92.2  PLT 204 190   Cardiac Enzymes: No results for input(s): CKTOTAL, CKMB, CKMBINDEX, TROPONINI in the last 168 hours. BNP (last 3 results) No results for input(s): PROBNP in the last 8760 hours. CBG: No results for input(s): GLUCAP in the last 168 hours. D-Dimer: No results for input(s): DDIMER in the last 72 hours. Hgb A1c: Recent Labs    01/07/20 0540  HGBA1C 4.8   Lipid Profile: Recent Labs    01/07/20 0540  CHOL 121  HDL 27*  LDLCALC 64  TRIG 151*  CHOLHDL 4.5   Thyroid function studies: Recent Labs    01/07/20 0540  TSH 1.972   Anemia work up: No results for input(s): VITAMINB12, FOLATE, FERRITIN, TIBC, IRON, RETICCTPCT in the last 72 hours. Sepsis Labs: Recent Labs  Lab 01/06/20 1405 01/07/20 1408  WBC 5.6 7.3    Microbiology Recent Results (from the past 240 hour(s))  SARS CORONAVIRUS 2 (TAT 6-24 HRS) Nasopharyngeal Nasopharyngeal Swab     Status: None   Collection Time: 01/06/20  8:05 PM   Specimen: Nasopharyngeal Swab  Result Value Ref Range Status   SARS Coronavirus 2 NEGATIVE NEGATIVE Final    Comment:  (NOTE) SARS-CoV-2 target nucleic acids are NOT DETECTED.  The SARS-CoV-2 RNA is generally detectable in upper and lower respiratory specimens during the acute phase of infection. Negative results do not preclude SARS-CoV-2 infection, do not rule out co-infections with other pathogens, and should not be used as the sole basis for treatment or other patient management decisions. Negative results must be combined with clinical observations, patient history, and epidemiological information.  The expected result is Negative.  Fact Sheet for Patients: SugarRoll.be  Fact Sheet for Healthcare Providers: https://www.woods-mathews.com/  This test is not yet approved or cleared by the [place] FDA and  has been authorized for detection and/or diagnosis of SARS-CoV-2 by FDA under an Emergency Use Authorization (EUA). This EUA will remain  in effect (meaning this test can be used) for the duration of the COVID-19 declaration under Se ction 564(b)(1) of the Act, 21 U.S.C. section 360bbb-3(b)(1), unless the authorization is terminated or revoked sooner.  Performed at [facility], [place] [address], [place], [place] [address]     Procedures and diagnostic studies:  DG Chest 2 View  Result Date: 01/06/2020 CLINICAL DATA:  Left-sided chest pain EXAM: CHEST - 2 VIEW COMPARISON:  12/24/2019 FINDINGS: Interval placement right internal jugular approach hemodialysis catheter with distal tip terminating level of the superior cavoatrial junction. Stable heart size. Atherosclerotic calcification of the aortic knob. Small bilateral pleural effusions, right greater than left. Streaky bibasilar opacities, favor atelectasis. Pneumothorax. IMPRESSION: 1. Small bilateral pleural effusions, right greater than left. 2. Streaky bibasilar opacities, favor atelectasis. Electronically Signed   By: Davina Poke D.O.   On: 01/06/2020 15:01                LOS: 0 days   Blaike Vickers  [facility]     01/07/2020, 2:21 PM

## 2020-01-07 NOTE — Progress Notes (Signed)
[facility]  ROUNDING NOTE   Subjective:   [name] admitted to [facility] on 01/06/2020 for NSTEMI (non-ST elevated myocardial infarction) [facility]) [I21.4]  Patient was on hemodialysis treatment yesterday and after 53 minutes was taken off treatment due to chest pain. He states he does not want to make up his dialysis treatment today.   Objective:  Vital signs in last 24 hours:  Temp:  [98.1 F (36.7 C)] 98.1 F (36.7 C) (07/21 1716) Pulse Rate:  [95-111] 99 (07/22 0900) Resp:  [18-28] 20 (07/22 0630) BP: (102-144)/(59-92) 121/74 (07/22 0900) SpO2:  [94 %-100 %] 100 % (07/22 0900) Weight:  [83.9 kg] 83.9 kg (07/21 1405)  Weight change:  Filed Weights   01/06/20 1405  Weight: 83.9 kg    Intake/Output: No intake/output data recorded.   Intake/Output this shift:  No intake/output data recorded.  Physical Exam: General: NAD,   Head: Normocephalic, atraumatic. Moist oral mucosal membranes  Eyes: Anicteric, PERRL  Neck: Supple, trachea midline  Lungs:  Clear to auscultation  Heart: Regular rate and rhythm  Abdomen:  Soft, nontender,   Extremities:  + peripheral edema.  Neurologic: Nonfocal, moving all four extremities  Skin: No lesions  Access: RIJ permcath, left AVF maturing    Basic Metabolic Panel: Recent Labs  Lab 01/06/20 1405  NA 137  K 3.6  CL 95*  CO2 26  GLUCOSE 100*  BUN 21  CREATININE 3.70*  CALCIUM 8.7*    Liver Function Tests: No results for input(s): AST, ALT, ALKPHOS, BILITOT, PROT, ALBUMIN in the last 168 hours. No results for input(s): LIPASE, AMYLASE in the last 168 hours. No results for input(s): AMMONIA in the last 168 hours.  CBC: Recent Labs  Lab 01/06/20 1405  WBC 5.6  HGB 8.0*  HCT 26.7*  MCV 91.8  PLT 204    Cardiac Enzymes: No results for input(s): CKTOTAL, CKMB, CKMBINDEX, TROPONINI in the last 168 hours.  BNP: Invalid input(s): POCBNP  CBG: No results for input(s): GLUCAP in the last 168  hours.  Microbiology: Results for orders placed or performed during the hospital encounter of 01/06/20  SARS CORONAVIRUS 2 (TAT 6-24 HRS) Nasopharyngeal Nasopharyngeal Swab     Status: None   Collection Time: 01/06/20  8:05 PM   Specimen: Nasopharyngeal Swab  Result Value Ref Range Status   SARS Coronavirus 2 NEGATIVE NEGATIVE Final    Comment: (NOTE) SARS-CoV-2 target nucleic acids are NOT DETECTED.  The SARS-CoV-2 RNA is generally detectable in upper and lower respiratory specimens during the acute phase of infection. Negative results do not preclude SARS-CoV-2 infection, do not rule out co-infections with other pathogens, and should not be used as the sole basis for treatment or other patient management decisions. Negative results must be combined with clinical observations, patient history, and epidemiological information. The expected result is Negative.  Fact Sheet for Patients: SugarRoll.be  Fact Sheet for Healthcare Providers: https://www.woods-mathews.com/  This test is not yet approved or cleared by the [place] FDA and  has been authorized for detection and/or diagnosis of SARS-CoV-2 by FDA under an Emergency Use Authorization (EUA). This EUA will remain  in effect (meaning this test can be used) for the duration of the COVID-19 declaration under Se ction 564(b)(1) of the Act, 21 U.S.C. section 360bbb-3(b)(1), unless the authorization is terminated or revoked sooner.  Performed at [facility], [place] [address], [place], [place] [address]     Coagulation Studies: Recent Labs    01/06/20  2005  LABPROT 12.3  INR 1.0    Urinalysis: No results for input(s): COLORURINE, LABSPEC, PHURINE, GLUCOSEU, HGBUR, BILIRUBINUR, KETONESUR, PROTEINUR, UROBILINOGEN, NITRITE, LEUKOCYTESUR in the last 72 hours.  Invalid input(s): APPERANCEUR    Imaging: DG Chest 2 View  Result Date: 01/06/2020 CLINICAL DATA:  Left-sided  chest pain EXAM: CHEST - 2 VIEW COMPARISON:  12/24/2019 FINDINGS: Interval placement right internal jugular approach hemodialysis catheter with distal tip terminating level of the superior cavoatrial junction. Stable heart size. Atherosclerotic calcification of the aortic knob. Small bilateral pleural effusions, right greater than left. Streaky bibasilar opacities, favor atelectasis. Pneumothorax. IMPRESSION: 1. Small bilateral pleural effusions, right greater than left. 2. Streaky bibasilar opacities, favor atelectasis. Electronically Signed   By: Davina Poke D.O.   On: 01/06/2020 15:01     Medications:   . heparin 1,100 Units/hr (01/07/20 0533)  . methocarbamol (ROBAXIN) IV Stopped (01/07/20 0001)   . aspirin EC  81 mg Oral Daily  . atorvastatin  40 mg Oral Daily  . famotidine  20 mg Oral Daily  . finasteride  5 mg Oral Daily  . patiromer  1 packet Oral Daily   acetaminophen, methocarbamol (ROBAXIN) IV, morphine injection  Assessment/ Plan:  [name] is a 79 y.o. white male with end stage renal disease on hemodialysis, hypertension, hyperlipidemia, BPH, coronary artery disease status post CABG, GERD who is admitted to [facility] on 01/06/2020 for NSTEMI (non-ST elevated myocardial infarction) ([facility]) [I21.4]  [facility] RIJ permcath 83.5kg RIJ permcath   1. End Stage Renal Disease: did not complete hemodialysis treatment yesterday. No acute indication for dialysis today. Next treatment for tomorrow.  - discontinue veltassa  2. Hypertension: currently off all blood pressure agents  3. Anemia of chronic kidney disease: hemoglobin 8.  - Hold EPO due to ischemic event.   4. Secondary Hyperparathyroidism: outpatient labs on 7/14 show PTH, calcium and phosphorus in goal. Not currently on any binders.    LOS: 0 Arthur Bowen 7/22/202110:07 AM

## 2020-01-08 DIAGNOSIS — I120 Hypertensive chronic kidney disease with stage 5 chronic kidney disease or end stage renal disease: Secondary | ICD-10-CM | POA: Diagnosis not present

## 2020-01-08 DIAGNOSIS — N185 Chronic kidney disease, stage 5: Secondary | ICD-10-CM | POA: Diagnosis not present

## 2020-01-08 DIAGNOSIS — D631 Anemia in chronic kidney disease: Secondary | ICD-10-CM | POA: Diagnosis not present

## 2020-01-08 DIAGNOSIS — N2581 Secondary hyperparathyroidism of renal origin: Secondary | ICD-10-CM | POA: Diagnosis not present

## 2020-01-08 DIAGNOSIS — I509 Heart failure, unspecified: Secondary | ICD-10-CM | POA: Diagnosis not present

## 2020-01-08 DIAGNOSIS — I5043 Acute on chronic combined systolic (congestive) and diastolic (congestive) heart failure: Secondary | ICD-10-CM | POA: Diagnosis present

## 2020-01-08 DIAGNOSIS — R079 Chest pain, unspecified: Secondary | ICD-10-CM | POA: Diagnosis not present

## 2020-01-08 DIAGNOSIS — N186 End stage renal disease: Secondary | ICD-10-CM | POA: Diagnosis not present

## 2020-01-08 LAB — CBC
HCT: 26.3 % — ABNORMAL LOW (ref 39.0–52.0)
Hemoglobin: 7.9 g/dL — ABNORMAL LOW (ref 13.0–17.0)
MCH: 27.5 pg (ref 26.0–34.0)
MCHC: 30 g/dL (ref 30.0–36.0)
MCV: 91.6 fL (ref 80.0–100.0)
Platelets: 190 10*3/uL (ref 150–400)
RBC: 2.87 MIL/uL — ABNORMAL LOW (ref 4.22–5.81)
RDW: 16.4 % — ABNORMAL HIGH (ref 11.5–15.5)
WBC: 6.7 10*3/uL (ref 4.0–10.5)
nRBC: 0 % (ref 0.0–0.2)

## 2020-01-08 LAB — ECHOCARDIOGRAM COMPLETE
Area-P 1/2: 6.96 cm2
Calc EF: 28.5 %
Height: 72 in
MV M vel: 5.71 m/s
MV Peak grad: 130.4 mmHg
S' Lateral: 5.39 cm
Single Plane A2C EF: 23.1 %
Single Plane A4C EF: 32.8 %
Weight: 2960 oz

## 2020-01-08 LAB — HEPARIN LEVEL (UNFRACTIONATED): Heparin Unfractionated: 0.45 IU/mL (ref 0.30–0.70)

## 2020-01-08 MED ORDER — CARVEDILOL 6.25 MG PO TABS
6.2500 mg | ORAL_TABLET | Freq: Two times a day (BID) | ORAL | Status: DC
  Administered 2020-01-08: 6.25 mg via ORAL
  Filled 2020-01-08 (×2): qty 1

## 2020-01-08 MED ORDER — ACETAMINOPHEN 325 MG PO TABS
650.0000 mg | ORAL_TABLET | ORAL | Status: DC | PRN
  Administered 2020-01-08: 650 mg via ORAL
  Filled 2020-01-08: qty 2

## 2020-01-08 MED ORDER — EPOETIN ALFA 10000 UNIT/ML IJ SOLN
10000.0000 [IU] | INTRAMUSCULAR | Status: DC
  Administered 2020-01-08: 10000 [IU] via INTRAVENOUS

## 2020-01-08 MED ORDER — CHLORHEXIDINE GLUCONATE CLOTH 2 % EX PADS
6.0000 | MEDICATED_PAD | Freq: Every day | CUTANEOUS | Status: DC
  Administered 2020-01-08: 6 via TOPICAL

## 2020-01-08 NOTE — Progress Notes (Signed)
[facility]  ROUNDING NOTE   Subjective:   Seen and examined on hemodialysis treatment. Tolerating treatment well. Denies any chest pain.   Objective:  Vital signs in last 24 hours:  Temp:  [97.9 F (36.6 C)-98.5 F (36.9 C)] 98.5 F (36.9 C) (07/23 0725) Pulse Rate:  [98-108] 99 (07/23 1100) Resp:  [16-33] 22 (07/23 1100) BP: (100-141)/(55-86) 114/69 (07/23 1100) SpO2:  [94 %-99 %] 96 % (07/23 1015) Weight:  [82.1 kg] 82.1 kg (07/23 0447)  Weight change: -1.814 kg Filed Weights   01/06/20 1405 01/08/20 0447  Weight: 83.9 kg 82.1 kg    Intake/Output: I/O last 3 completed shifts: In: 69 [I.V.:77] Out: -    Intake/Output this shift:  Total I/O In: 240 [P.O.:240] Out: -   Physical Exam: General: NAD,   Head: Normocephalic, atraumatic. Moist oral mucosal membranes  Eyes: Anicteric, PERRL  Neck: Supple, trachea midline  Lungs:  Clear to auscultation  Heart: Regular rate and rhythm  Abdomen:  Soft, nontender,   Extremities:  + peripheral edema.  Neurologic: Nonfocal, moving all four extremities  Skin: No lesions  Access: RIJ permcath, left AVF maturing    Basic Metabolic Panel: Recent Labs  Lab 01/06/20 1405 01/07/20 1408  NA 137 135  K 3.6 4.2  CL 95* 96*  CO2 26 25  GLUCOSE 100* 149*  BUN 21 29*  CREATININE 3.70* 4.70*  CALCIUM 8.7* 8.6*  PHOS  --  4.8*    Liver Function Tests: Recent Labs  Lab 01/07/20 1408  ALBUMIN 2.9*   No results for input(s): LIPASE, AMYLASE in the last 168 hours. No results for input(s): AMMONIA in the last 168 hours.  CBC: Recent Labs  Lab 01/06/20 1405 01/07/20 1408 01/08/20 0502  WBC 5.6 7.3 6.7  HGB 8.0* 8.1* 7.9*  HCT 26.7* 27.2* 26.3*  MCV 91.8 92.2 91.6  PLT 204 190 190    Cardiac Enzymes: No results for input(s): CKTOTAL, CKMB, CKMBINDEX, TROPONINI in the last 168 hours.  BNP: Invalid input(s): POCBNP  CBG: No results for input(s): GLUCAP in the last 168  hours.  Microbiology: Results for orders placed or performed during the hospital encounter of 01/06/20  SARS CORONAVIRUS 2 (TAT 6-24 HRS) Nasopharyngeal Nasopharyngeal Swab     Status: None   Collection Time: 01/06/20  8:05 PM   Specimen: Nasopharyngeal Swab  Result Value Ref Range Status   SARS Coronavirus 2 NEGATIVE NEGATIVE Final    Comment: (NOTE) SARS-CoV-2 target nucleic acids are NOT DETECTED.  The SARS-CoV-2 RNA is generally detectable in upper and lower respiratory specimens during the acute phase of infection. Negative results do not preclude SARS-CoV-2 infection, do not rule out co-infections with other pathogens, and should not be used as the sole basis for treatment or other patient management decisions. Negative results must be combined with clinical observations, patient history, and epidemiological information. The expected result is Negative.  Fact Sheet for Patients: SugarRoll.be  Fact Sheet for Healthcare Providers: https://www.woods-mathews.com/  This test is not yet approved or cleared by the [place] FDA and  has been authorized for detection and/or diagnosis of SARS-CoV-2 by FDA under an Emergency Use Authorization (EUA). This EUA will remain  in effect (meaning this test can be used) for the duration of the COVID-19 declaration under Se ction 564(b)(1) of the Act, 21 U.S.C. section 360bbb-3(b)(1), unless the authorization is terminated or revoked sooner.  Performed at [facility], [place] [address] [place], [place] [address]   MRSA  PCR Screening     Status: None   Collection Time: 01/07/20  7:48 PM   Specimen: Nasopharyngeal  Result Value Ref Range Status   MRSA by PCR NEGATIVE NEGATIVE Final    Comment:        The GeneXpert MRSA Assay (FDA approved for NASAL specimens only), is one component of a comprehensive MRSA colonization surveillance program. It is not intended to diagnose  MRSA infection nor to guide or monitor treatment for MRSA infections. Performed at [facility], [address] [place], [place] [address]     Coagulation Studies: Recent Labs    01/06/20 09-20-2003  LABPROT 12.3  INR 1.0    Urinalysis: No results for input(s): COLORURINE, LABSPEC, PHURINE, GLUCOSEU, HGBUR, BILIRUBINUR, KETONESUR, PROTEINUR, UROBILINOGEN, NITRITE, LEUKOCYTESUR in the last 72 hours.  Invalid input(s): APPERANCEUR    Imaging: DG Chest 2 View  Result Date: 01/06/2020 CLINICAL DATA:  Left-sided chest pain EXAM: CHEST - 2 VIEW COMPARISON:  12/24/2019 FINDINGS: Interval placement right internal jugular approach hemodialysis catheter with distal tip terminating level of the superior cavoatrial junction. Stable heart size. Atherosclerotic calcification of the aortic knob. Small bilateral pleural effusions, right greater than left. Streaky bibasilar opacities, favor atelectasis. Pneumothorax. IMPRESSION: 1. Small bilateral pleural effusions, right greater than left. 2. Streaky bibasilar opacities, favor atelectasis. Electronically Signed   By: Davina Poke D.O.   On: 01/06/2020 15:01   ECHOCARDIOGRAM COMPLETE  Result Date: 01/08/2020    ECHOCARDIOGRAM REPORT   Patient Name:   Arthur Bowen Date of Exam: 01/07/2020 Medical Rec #:  [id number]    Height:       72.0 in Accession #:    [id number]   Weight:       185.0 lb Date of Birth:  31-May-1941    BSA:          2.061 m Patient Age:    79 years     BP:           121/74 mmHg Patient Gender: M            HR:           105 bpm. Exam Location:  [facility] Procedure: 2D Echo Indications:     CHEST PAIN 756.50/R07.9  History:         Patient has prior history of Echocardiogram examinations, most                  recent 06/06/2018. CAD, Signs/Symptoms:Shortness of Breath;                  Risk Factors:Hypertension.  Sonographer:     Avanell Shackleton Referring Phys:  [place] Diagnosing Phys: Bartholome Bill MD  Sonographer  Comments: Technically difficult study due to poor echo windows. IMPRESSIONS  1. Left ventricular ejection fraction, by estimation, is 30 to 35%. The left ventricle has moderately decreased function. The left ventricle demonstrates regional wall motion abnormalities (see scoring diagram/findings for description). The left ventricular internal cavity size was mildly dilated. Left ventricular diastolic parameters were normal.  2. Right ventricular systolic function is normal. The right ventricular size is normal. There is severely elevated pulmonary artery systolic pressure.  3. Left atrial size was mildly dilated.  4. Right atrial size was mildly dilated.  5. The mitral valve was not well visualized. Mild to moderate mitral valve regurgitation.  6. The aortic valve is grossly normal. Aortic valve regurgitation is trivial. FINDINGS  Left Ventricle: Left ventricular ejection fraction, by  estimation, is 30 to 35%. The left ventricle has moderately decreased function. The left ventricle demonstrates regional wall motion abnormalities. The left ventricular internal cavity size was mildly dilated. There is no left ventricular hypertrophy. Left ventricular diastolic parameters were normal.  LV Wall Scoring: The antero-lateral wall and apical lateral segment are akinetic. The inferior septum and apex are hypokinetic. Right Ventricle: The right ventricular size is normal. No increase in right ventricular wall thickness. Right ventricular systolic function is normal. There is severely elevated pulmonary artery systolic pressure. The tricuspid regurgitant velocity is 3.73 m/s, and with an assumed right atrial pressure of 10 mmHg, the estimated right ventricular systolic pressure is 98.9 mmHg. Left Atrium: Left atrial size was mildly dilated. Right Atrium: Right atrial size was mildly dilated. Pericardium: There is no evidence of pericardial effusion. Mitral Valve: The mitral valve was not well visualized. Mild to moderate mitral  valve regurgitation. Tricuspid Valve: The tricuspid valve is not well visualized. Tricuspid valve regurgitation is mild. Aortic Valve: The aortic valve is grossly normal. Aortic valve regurgitation is trivial. Pulmonic Valve: The pulmonic valve was not well visualized. Pulmonic valve regurgitation is trivial. Aorta: The aortic root was not well visualized. IAS/Shunts: The interatrial septum was not assessed.  LEFT VENTRICLE PLAX 2D LVIDd:         5.93 cm      Diastology LVIDs:         5.39 cm      LV e' lateral:   9.03 cm/s LV PW:         1.34 cm      LV E/e' lateral: 14.2 LV IVS:        1.21 cm      LV e' medial:    5.11 cm/s LVOT diam:     1.70 cm      LV E/e' medial:  25.0 LVOT Area:     2.27 cm  LV Volumes (MOD) LV vol d, MOD A2C: 242.0 ml LV vol d, MOD A4C: 244.0 ml LV vol s, MOD A2C: 186.0 ml LV vol s, MOD A4C: 164.0 ml LV SV MOD A2C:     56.0 ml LV SV MOD A4C:     244.0 ml LV SV MOD BP:      70.8 ml IVC IVC diam: 2.26 cm LEFT ATRIUM              Index       RIGHT ATRIUM           Index LA diam:        5.30 cm  2.57 cm/m  RA Area:     19.20 cm LA Vol (A2C):   148.0 ml 71.81 ml/m RA Volume:   49.30 ml  23.92 ml/m LA Vol (A4C):   96.4 ml  46.77 ml/m LA Biplane Vol: 118.0 ml 57.25 ml/m   AORTA Ao Root diam: 3.20 cm MITRAL VALVE                TRICUSPID VALVE MV Area (PHT): 6.96 cm     TR Peak grad:   55.7 mmHg MV Decel Time: 109 msec     TR Vmax:        373.00 cm/s MR Peak grad: 130.4 mmHg MR Mean grad: 84.0 mmHg     SHUNTS MR Vmax:      571.00 cm/s   Systemic Diam: 1.70 cm MR Vmean:     438.5 cm/s MV E velocity: 128.00 cm/s MV A velocity: 72.80 cm/s  MV E/A ratio:  1.76 Bartholome Bill MD Electronically signed by Bartholome Bill MD Signature Date/Time: 01/08/2020/8:53:56 AM    Final      Medications:   . methocarbamol (ROBAXIN) IV Stopped (01/07/20 0001)   . aspirin EC  81 mg Oral Daily  . atorvastatin  40 mg Oral Daily  . carvedilol  6.25 mg Oral BID WC  . Chlorhexidine Gluconate Cloth  6 each  Topical Daily  . famotidine  20 mg Oral Daily  . finasteride  5 mg Oral Daily  . furosemide  40 mg Intravenous BID  . isosorbide mononitrate  30 mg Oral Daily   acetaminophen, methocarbamol (ROBAXIN) IV, morphine injection  Assessment/ Plan:  Mr. JEREMEY BASCOM is a 79 y.o. white male with end stage renal disease on hemodialysis, hypertension, hyperlipidemia, BPH, coronary artery disease status post CABG, GERD who is admitted to [facility] on 01/06/2020 for ESRD on dialysis [facility]) [N18.6, Z99.2] NSTEMI (non-ST elevated myocardial infarction) ([place]) [I21.4] Nonspecific chest pain [R07.9]  [facility] M[facility] RIJ permcath 83.5kg RIJ permcath   1. End Stage Renal Disease: Seen and examined on hemodialysis treatment. Tolerating treatment well.   2. Hypertension: blood pressure at goal today - IV furosemide - carvedilol and started on isosorbide mononitrate this admission  3. Anemia of chronic kidney disease: hemoglobin 7.9 - EPO with HD treatment  4. Secondary Hyperparathyroidism: outpatient labs on 7/14 show PTH, calcium and phosphorus in goal. Not currently on any binders.    LOS: 0 Levelle Edelen 7/23/202111:32 AM

## 2020-01-08 NOTE — Progress Notes (Signed)
2006 MD messaged regarding patients BP and asymptomatic 86/50 & 85/46. 2145 Orders received "Hold Coreg"

## 2020-01-08 NOTE — Progress Notes (Signed)
[facility] Cardiology    SUBJECTIVE: [name] is an 79 year old male with a past medical history significant for coronary artery disease s/p CABG x 3 with a LIMA to the LAD, SVG to OM, and SVG to PD at [place], ESRD on HD, FSGS, hyperlipidemia, and hypertension who presented to the ED on 01/06/20 for an acute onset of chest pain that developed during dialysis treatment.  He was given nitroglycerin with resolution in symptoms, but developed CP again and was advised to present to the ED for further evaluation.  Workup in the ED was significant for high sensitivity troponin 198, 219, 215, and 226 respectively, BNP 1954, chest xray revealing small bilateral pleural effusions, right greater than left, and ECG revealing sinus tachycardia, rate of 101bpm, with a RBBB.   He is followed in outpatient cardiology by Dr. Ubaldo Glassing. Most recent left heart catheterization through [facility] in 2013 revealed patent grafts.  Stress test in 2020 revealed normal LV function with no evidence of ischemia.   01/07/20: He is currently chest pain free and denies palpitations, shortness of breath, lower extremity swelling, orthopnea, or PND.   01/08/20: Admits to an episode of chest discomfort with an associated headache this morning that has since resolved.  He appears to have tolerated Imdur yesterday morning with no apparent immediate side effects.  He denies shortness of breath, orthopnea, or PND.    Vitals:   01/07/20 1933 01/07/20 1951 01/08/20 0447 01/08/20 0725  BP:  (!) 141/79 118/75 (!) 127/86  Pulse:  (!) 108 98 99  Resp:  18 16 18   Temp: 98.1 F (36.7 C) 97.9 F (36.6 C) 98.4 F (36.9 C) 98.5 F (36.9 C)  TempSrc: Oral Oral Oral Oral  SpO2:  99% 94% 98%  Weight:   82.1 kg   Height:         Intake/Output Summary (Last 24 hours) at 01/08/2020 7510 Last data filed at 01/08/2020 0827 Gross per 24 hour  Intake 317 ml  Output --  Net 317 ml      PHYSICAL EXAM  General: Well developed, well nourished, in no acute  distress HEENT:  Normocephalic and atramatic Neck:  No JVD.  Lungs: Clear bilaterally to auscultation and percussion. Heart: HRRR . Normal S1 and S2 without gallops or murmurs.  Abdomen: Bowel sounds are positive, abdomen soft and non-tender  Msk:  Back normal. Normal strength and tone for age. Extremities: No clubbing, cyanosis or edema.   Neuro: Alert and oriented X 3. Psych:  Good affect, responds appropriately   LABS: Basic Metabolic Panel: Recent Labs    01/06/20 1405 01/07/20 1408  NA 137 135  K 3.6 4.2  CL 95* 96*  CO2 26 25  GLUCOSE 100* 149*  BUN 21 29*  CREATININE 3.70* 4.70*  CALCIUM 8.7* 8.6*  PHOS  --  4.8*   Liver Function Tests: Recent Labs    01/07/20 1408  ALBUMIN 2.9*   No results for input(s): LIPASE, AMYLASE in the last 72 hours. CBC: Recent Labs    01/07/20 1408 01/08/20 0502  WBC 7.3 6.7  HGB 8.1* 7.9*  HCT 27.2* 26.3*  MCV 92.2 91.6  PLT 190 190   Cardiac Enzymes: No results for input(s): CKTOTAL, CKMB, CKMBINDEX, TROPONINI in the last 72 hours. BNP: Invalid input(s): POCBNP D-Dimer: No results for input(s): DDIMER in the last 72 hours. Hemoglobin A1C: Recent Labs    01/07/20 0540  HGBA1C 4.8   Fasting Lipid Panel: Recent Labs  01/07/20 0540  CHOL 121  HDL 27*  LDLCALC 64  TRIG 151*  CHOLHDL 4.5   Thyroid Function Tests: Recent Labs    01/07/20 0540  TSH 1.972   Anemia Panel: No results for input(s): VITAMINB12, FOLATE, FERRITIN, TIBC, IRON, RETICCTPCT in the last 72 hours.  DG Chest 2 View  Result Date: 01/06/2020 CLINICAL DATA:  Left-sided chest pain EXAM: CHEST - 2 VIEW COMPARISON:  12/24/2019 FINDINGS: Interval placement right internal jugular approach hemodialysis catheter with distal tip terminating level of the superior cavoatrial junction. Stable heart size. Atherosclerotic calcification of the aortic knob. Small bilateral pleural effusions, right greater than left. Streaky bibasilar opacities, favor  atelectasis. Pneumothorax. IMPRESSION: 1. Small bilateral pleural effusions, right greater than left. 2. Streaky bibasilar opacities, favor atelectasis. Electronically Signed   By: Davina Poke D.O.   On: 01/06/2020 15:01     Echo: Moderately reduced LV systolic function with an EF estimated between 30-35% with normal RV systolic function, severely elevated pulmonary pressures. Mild to moderate MR.  Evidence of antero-lateral and apical akinesis with hypokinesis in the septum and apex.   TELEMETRY: Sinus rhythm to sinus tachycardia; rate fluctuating in the upper 90s and lower 100s   ASSESSMENT AND PLAN:  Principal Problem:   Chest pain Active Problems:   Coronary artery disease involving native coronary artery of native heart with angina pectoris (HCC)   Dyslipidemia   Essential (primary) hypertension   Focal segmental glomerulosclerosis   BPH with obstruction/lower urinary tract symptoms   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  Chest pain/elevated troponin   -Elevated with no significant delta; had an episode of chest tightness this morning but is currently chest pain free  -Echocardiogram revealed reduced LV systolic function, EF estimated between 30-35%  -Will add carvedilol 6.25mg  BID; will make further additions/recomendations in an outpatient setting    -With newly reduced LV systolic function, will plan to optimize medications, ambulate, and tentatively plan to discharge tomorrow with an ischemic workup in an outpatient setting   -If clinical course worsens, may consider LHC Monday   2.  Pleural effusions   -Currently receiving Lasix injection 40mg  BID; nephrology on board   3.  ESRD on HD   -Nephrology evaluated; HD planned for today   4.  Hyperlipidemia   -Continue atorvastatin 40mg  daily   The history, physical exam findings, and plan of care were all discussed with Dr. Bartholome Bill, and all decision making was made in  collaboration.   Avie Arenas  PA-C 01/08/2020 8:42 AM

## 2020-01-08 NOTE — Progress Notes (Signed)
*  PRELIMINARY RESULTS* Echocardiogram 2D Echocardiogram has been performed.  Sherrie Sport 01/08/2020, 6:58 AM

## 2020-01-08 NOTE — Care Management Obs Status (Signed)
[place] NOTIFICATION   Patient Details  Name: Arthur Bowen MRN: [id number] Date of Birth: Jul 11, 1940   Medicare Observation Status Notification Given:  Yes    Meriel Flavors, LCSW 01/08/2020, 9:30 AM

## 2020-01-08 NOTE — Consult Note (Signed)
[place] for Heparin drip Indication: chest pain/ACS/STEMI  Patient Measurements: Height: 6' (182.9 cm) Weight: 83.9 kg (185 lb) IBW/kg (Calculated) : 77.6  Vital Signs: Temp: 98.4 F (36.9 C) (07/23 0447) Temp Source: Oral (07/23 0447) BP: 118/75 (07/23 0447) Pulse Rate: 98 (07/23 0447)  Labs: Recent Labs    01/06/20 1405 01/06/20 1405 01/06/20 1716 01/06/20 2005 01/06/20 2148 01/07/20 0006 01/07/20 0540 01/07/20 1408 01/08/20 0502  HGB 8.0*   < >  --   --   --   --   --  8.1* 7.9*  HCT 26.7*  --   --   --   --   --   --  27.2* 26.3*  PLT 204  --   --   --   --   --   --  190 190  APTT  --   --   --  31  --   --   --   --   --   LABPROT  --   --   --  12.3  --   --   --   --   --   INR  --   --   --  1.0  --   --   --   --   --   HEPARINUNFRC  --   --   --   --   --   --  0.36 0.55 0.45  CREATININE 3.70*  --   --   --   --   --   --  4.70*  --   TROPONINIHS 198*   < > 219*  --  215* 226*  --   --   --    < > = values in this interval not displayed.    Estimated Creatinine Clearance: 13.5 mL/min (A) (by C-G formula based on SCr of 4.7 mg/dL (H)).   Medical History: Past Medical History:  Diagnosis Date  . Acquired phimosis   . BPH (benign prostatic hypertrophy)   . Chronic kidney disease   . Dyspnea    with exertion  . Elevated PSA   . GERD (gastroesophageal reflux disease)   . Gross hematuria   . Hematuria   . History of hiatal hernia   . Hypertension   . 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 Renal mass, left   . Symptomatic anemia 11/21/2017    Assessment: 79 y.o. male with past medical history of CAD status post CABG, hypertension, hyperlipidemia, FSGS, and ESRD on HD who presents to the ED complaining of chest pain. Pharmacy was consulted to initiate and monitor a heparin drip for ACS/STEMI. H&H low at baseline but appear to be stable, platelets wnl, no chronic anticoagulation PTA  Heparin  Course: 07/21 initiation: 1100 units/hr 07/22 0540 HL 0.36: no change 07/22 1408 HL 0.55: no change 07/23 0501 HL 0.45: no change, CBC stable  Goal of Therapy:  Heparin level 0.3-0.7 units/ml Monitor platelets by anticoagulation protocol: Yes   Plan:   Anti-Xa level therapeutic: continue heparin infusion at 1100 units/hr  This is the 3rd consecutive therapeutic heparin level  Per protocol move to once daily anti-Xa levels  Check anti-Xa level daily while on heparin: next 7/24 am  Continue to monitor H&H and platelets: next CBC am 7/24  Ena Dawley, PharmD Clinical Pharmacist 01/08/2020 6:43 AM

## 2020-01-08 NOTE — Progress Notes (Addendum)
Progress Note    Arthur Bowen  OVF:[id number] DOB: [birth year]  DOA: 01/06/2020 PCP: Glean Hess, MD      Brief Narrative:    Medical records reviewed and are as summarized below:  Arthur Bowen is a 79 y.o. male with medical history significant for CAD, CKD stage IV biopsy proven FSGS, CAD s/p CABG, BPH, hyperlipidemia, hypertension, left renal mass.  He was referred from the outpatient hemodialysis center because he developed chest pain/chest tightness after dialysis.  He was given nitroglycerin with some improvement in chest pain.  His daughter said there was some suspicion that chest pain/tightness may have been due to too much fluid being pulled off at dialysis.  His troponins were mildly elevated there was no significant change in troponins.  He was seen in consultation by the cardiologist recommended conservative management in addition of Imdur.  He was also seen by the nephrologist because of ESRD.  He was treated with IV Lasix for acute on chronic diastolic CHF/fluid overload.      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  Patient evaluated by cardiologist who recommended conservative management and addition of Imdur.  Continue aspirin and Lipitor.  ESRD on HD: Plan for hemodialysis today  Acute on chronic systolic and diastolic CHF, severe pulmonary hypertension: 2D echo on 01/08/2020 showed severe pulmonary hypertension, EF estimated at 30-35% (which is  worse compared to EF of 55 to 60% in December 2019).  He has been started on Coreg by cardiologist.  Monitor BP closely.  Blood pressure has been soft.   Acute hypoxemic respiratory failure: Continue 2 L/min oxygen via nasal cannula.  Taper off oxygen as able.  Anemia of chronic kidney disease: H&H is stable.  No indication for blood transfusion at this time.  Body mass index is 24.55 kg/m.  Diet Order            Diet renal with fluid restriction Fluid restriction: 1200 mL Fluid; Room service appropriate? Yes; Fluid consistency: Thin  Diet effective now                       Medications:   . aspirin EC  81 mg Oral Daily  . atorvastatin  40 mg Oral Daily  . carvedilol  6.25 mg Oral BID WC  . Chlorhexidine Gluconate Cloth  6 each Topical Daily  . epoetin (EPOGEN/PROCRIT) injection  10,000 Units Intravenous Q M,W,F-HD  . famotidine  20 mg Oral Daily  . finasteride  5 mg Oral Daily  . furosemide  40 mg Intravenous BID  . isosorbide mononitrate  30 mg Oral Daily   Continuous Infusions: . methocarbamol (ROBAXIN) IV Stopped (01/07/20 0001)     Anti-infectives (From admission, onward)   None             Family Communication/Anticipated D/C date and plan/Code Status   DVT prophylaxis:      Code Status: Full Code  Family Communication: Plan discussed with patient Disposition Plan:  Status is: Observation  The patient will require care spanning > 2 midnights and should be moved to inpatient because: IV treatments appropriate due to intensity of illness or inability to take PO and Inpatient level of care appropriate due to severity of illness  Dispo: The patient is from: Home              Anticipated d/c is to: Home              Anticipated d/c date is: 1 day              Patient currently is not medically stable to d/c.           Subjective:   C/o shortness of breath.  No chest pain.  Objective:    Vitals:   01/08/20 1015 01/08/20 1030  01/08/20 1045 01/08/20 1100  BP: (!) 100/62 109/71 109/67 114/69  Pulse: (!) 106 101 99 99  Resp: (!) 33 19 (!) 24 22  Temp:      TempSrc:      SpO2: 96%     Weight:      Height:       No data found.   Intake/Output Summary (Last 24 hours) at 01/08/2020 1213 Last data filed at 01/08/2020 0827 Gross per 24 hour  Intake 317 ml  Output --  Net 317 ml   Filed Weights   01/06/20 1405 01/08/20 0447  Weight: 83.9 kg 82.1 kg    Exam:  GEN: NAD SKIN: No rash EYES: No pallor or icterus ENT: MMM CV: RRR PULM: Bilateral RR slightly diffuse bilateral expiratory wheezing ABD: soft, ND, NT, +BS CNS: AAO x 3, non focal EXT: No edema or tenderness   Data Reviewed:   I have personally reviewed following labs and imaging studies:  Labs: Labs show the following:   Basic Metabolic Panel: Recent Labs  Lab 01/06/20 1405 01/07/20 1408  NA 137 135  K 3.6 4.2  CL 95* 96*  CO2 26 25  GLUCOSE 100* 149*  BUN 21 29*  CREATININE 3.70* 4.70*  CALCIUM 8.7* 8.6*  PHOS  --  4.8*   GFR Estimated Creatinine Clearance: 13.5 mL/min (A) (by C-G formula based on SCr of 4.7 mg/dL (H)). Liver Function Tests: Recent Labs  Lab 01/07/20 1408  ALBUMIN 2.9*   No results for input(s): LIPASE, AMYLASE in the last 168 hours. No results for input(s): AMMONIA in the last 168 hours. Coagulation profile Recent Labs  Lab 01/06/20 2005  INR 1.0    CBC: Recent Labs  Lab 01/06/20 1405 01/07/20 1408 01/08/20 0502  WBC 5.6 7.3 6.7  HGB 8.0* 8.1* 7.9*  HCT 26.7* 27.2* 26.3*  MCV 91.8 92.2 91.6  PLT 204 190 190   Cardiac Enzymes: No results for input(s): CKTOTAL, CKMB, CKMBINDEX, TROPONINI in the last 168 hours. BNP (last 3 results) No results for input(s): PROBNP in the last 8760 hours. CBG: No results for input(s): GLUCAP in the last 168 hours. D-Dimer: No results for input(s): DDIMER in the last 72 hours. Hgb A1c: Recent Labs    01/07/20 0540  HGBA1C 4.8   Lipid  Profile: Recent Labs    01/07/20 0540  CHOL 121  HDL 27*  LDLCALC 64  TRIG 151*  CHOLHDL 4.5   Thyroid function studies: Recent Labs    01/07/20 0540  TSH 1.972   Anemia work up: No results for input(s): VITAMINB12, FOLATE, FERRITIN, TIBC, IRON, RETICCTPCT in the last 72 hours. Sepsis Labs: Recent  Labs  Lab 01/06/20 1405 01/07/20 1408 01/08/20 0502  WBC 5.6 7.3 6.7    Microbiology Recent Results (from the past 240 hour(s))  SARS CORONAVIRUS 2 (TAT 6-24 HRS) Nasopharyngeal Nasopharyngeal Swab     Status: None   Collection Time: 01/06/20  8:05 PM   Specimen: Nasopharyngeal Swab  Result Value Ref Range Status   SARS Coronavirus 2 NEGATIVE NEGATIVE Final    Comment: (NOTE) SARS-CoV-2 target nucleic acids are NOT DETECTED.  The SARS-CoV-2 RNA is generally detectable in upper and lower respiratory specimens during the acute phase of infection. Negative results do not preclude SARS-CoV-2 infection, do not rule out co-infections with other pathogens, and should not be used as the sole basis for treatment or other patient management decisions. Negative results must be combined with clinical observations, patient history, and epidemiological information. The expected result is Negative.  Fact Sheet for Patients: SugarRoll.be  Fact Sheet for Healthcare Providers: https://www.woods-mathews.com/  This test is not yet approved or cleared by the [place] FDA and  has been authorized for detection and/or diagnosis of SARS-CoV-2 by FDA under an Emergency Use Authorization (EUA). This EUA will remain  in effect (meaning this test can be used) for the duration of the COVID-19 declaration under Se ction 564(b)(1) of the Act, 21 U.S.C. section 360bbb-3(b)(1), unless the authorization is terminated or revoked sooner.  Performed at [facility], [place] [address] [place], [place] [address]   MRSA PCR Screening     Status: None    Collection Time: 01/07/20  7:48 PM   Specimen: Nasopharyngeal  Result Value Ref Range Status   MRSA by PCR NEGATIVE NEGATIVE Final    Comment:        The GeneXpert MRSA Assay (FDA approved for NASAL specimens only), is one component of a comprehensive MRSA colonization surveillance program. It is not intended to diagnose MRSA infection nor to guide or monitor treatment for MRSA infections. Performed at [facility], [address] [place], [place] [address]     Procedures and diagnostic studies:  DG Chest 2 View  Result Date: 01/06/2020 CLINICAL DATA:  Left-sided chest pain EXAM: CHEST - 2 VIEW COMPARISON:  12/24/2019 FINDINGS: Interval placement right internal jugular approach hemodialysis catheter with distal tip terminating level of the superior cavoatrial junction. Stable heart size. Atherosclerotic calcification of the aortic knob. Small bilateral pleural effusions, right greater than left. Streaky bibasilar opacities, favor atelectasis. Pneumothorax. IMPRESSION: 1. Small bilateral pleural effusions, right greater than left. 2. Streaky bibasilar opacities, favor atelectasis. Electronically Signed   By: Davina Poke D.O.   On: 01/06/2020 15:01   ECHOCARDIOGRAM COMPLETE  Result Date: 01/08/2020    ECHOCARDIOGRAM REPORT   Patient Name:   Arthur Bowen Date of Exam: 01/07/2020 Medical Rec #:  [id number]    Height:       72.0 in Accession #:    [id number]   Weight:       185.0 lb Date of Birth:  1940/08/16    BSA:          2.061 m Patient Age:    89 years     BP:           121/74 mmHg Patient Gender: M            HR:           105 bpm. Exam Location:  [facility] Procedure: 2D Echo Indications:     CHEST PAIN 756.50/R07.9  History:  Patient has prior history of Echocardiogram examinations, most                  recent 06/06/2018. CAD, Signs/Symptoms:Shortness of Breath;                  Risk Factors:Hypertension.  Sonographer:     Avanell Shackleton Referring Phys:  [place] Diagnosing Phys: Bartholome Bill MD  Sonographer Comments: Technically difficult study due to poor echo windows. IMPRESSIONS  1. Left ventricular ejection fraction, by estimation, is 30 to 35%. The left ventricle has moderately decreased function. The left ventricle demonstrates regional wall motion abnormalities (see scoring diagram/findings for description). The left ventricular internal cavity size was mildly dilated. Left ventricular diastolic parameters were normal.  2. Right ventricular systolic function is normal. The right ventricular size is normal. There is severely elevated pulmonary artery systolic pressure.  3. Left atrial size was mildly dilated.  4. Right atrial size was mildly dilated.  5. The mitral valve was not well visualized. Mild to moderate mitral valve regurgitation.  6. The aortic valve is grossly normal. Aortic valve regurgitation is trivial. FINDINGS  Left Ventricle: Left ventricular ejection fraction, by estimation, is 30 to 35%. The left ventricle has moderately decreased function. The left ventricle demonstrates regional wall motion abnormalities. The left ventricular internal cavity size was mildly dilated. There is no left ventricular hypertrophy. Left ventricular diastolic parameters were normal.  LV Wall Scoring: The antero-lateral wall and apical lateral segment are akinetic. The inferior septum and apex are hypokinetic. Right Ventricle: The right ventricular size is normal. No increase in right ventricular wall thickness. Right ventricular systolic function is normal. There is severely elevated pulmonary artery systolic pressure. The tricuspid regurgitant velocity is 3.73 m/s, and with an assumed right atrial pressure of 10 mmHg, the estimated right ventricular systolic pressure is 28.4 mmHg. Left Atrium: Left atrial size was mildly dilated. Right Atrium: Right atrial size was mildly dilated. Pericardium: There is no evidence of pericardial effusion. Mitral Valve:  The mitral valve was not well visualized. Mild to moderate mitral valve regurgitation. Tricuspid Valve: The tricuspid valve is not well visualized. Tricuspid valve regurgitation is mild. Aortic Valve: The aortic valve is grossly normal. Aortic valve regurgitation is trivial. Pulmonic Valve: The pulmonic valve was not well visualized. Pulmonic valve regurgitation is trivial. Aorta: The aortic root was not well visualized. IAS/Shunts: The interatrial septum was not assessed.  LEFT VENTRICLE PLAX 2D LVIDd:         5.93 cm      Diastology LVIDs:         5.39 cm      LV e' lateral:   9.03 cm/s LV PW:         1.34 cm      LV E/e' lateral: 14.2 LV IVS:        1.21 cm      LV e' medial:    5.11 cm/s LVOT diam:     1.70 cm      LV E/e' medial:  25.0 LVOT Area:     2.27 cm  LV Volumes (MOD) LV vol d, MOD A2C: 242.0 ml LV vol d, MOD A4C: 244.0 ml LV vol s, MOD A2C: 186.0 ml LV vol s, MOD A4C: 164.0 ml LV SV MOD A2C:     56.0 ml LV SV MOD A4C:     244.0 ml LV SV MOD BP:      70.8 ml IVC IVC diam: 2.26 cm  LEFT ATRIUM              Index       RIGHT ATRIUM           Index LA diam:        5.30 cm  2.57 cm/m  RA Area:     19.20 cm LA Vol (A2C):   148.0 ml 71.81 ml/m RA Volume:   49.30 ml  23.92 ml/m LA Vol (A4C):   96.4 ml  46.77 ml/m LA Biplane Vol: 118.0 ml 57.25 ml/m   AORTA Ao Root diam: 3.20 cm MITRAL VALVE                TRICUSPID VALVE MV Area (PHT): 6.96 cm     TR Peak grad:   55.7 mmHg MV Decel Time: 109 msec     TR Vmax:        373.00 cm/s MR Peak grad: 130.4 mmHg MR Mean grad: 84.0 mmHg     SHUNTS MR Vmax:      571.00 cm/s   Systemic Diam: 1.70 cm MR Vmean:     438.5 cm/s MV E velocity: 128.00 cm/s MV A velocity: 72.80 cm/s MV E/A ratio:  1.76 Bartholome Bill MD Electronically signed by Bartholome Bill MD Signature Date/Time: 01/08/2020/8:53:56 AM    Final                LOS: 0 days   Hortense Cantrall  [facility]     01/08/2020, 12:13 PM

## 2020-01-09 DIAGNOSIS — Z992 Dependence on renal dialysis: Secondary | ICD-10-CM

## 2020-01-09 DIAGNOSIS — I509 Heart failure, unspecified: Secondary | ICD-10-CM | POA: Diagnosis not present

## 2020-01-09 DIAGNOSIS — N185 Chronic kidney disease, stage 5: Secondary | ICD-10-CM | POA: Diagnosis not present

## 2020-01-09 DIAGNOSIS — N186 End stage renal disease: Secondary | ICD-10-CM

## 2020-01-09 DIAGNOSIS — N2581 Secondary hyperparathyroidism of renal origin: Secondary | ICD-10-CM | POA: Diagnosis not present

## 2020-01-09 DIAGNOSIS — I5043 Acute on chronic combined systolic (congestive) and diastolic (congestive) heart failure: Secondary | ICD-10-CM | POA: Diagnosis not present

## 2020-01-09 DIAGNOSIS — R079 Chest pain, unspecified: Secondary | ICD-10-CM | POA: Diagnosis not present

## 2020-01-09 DIAGNOSIS — D631 Anemia in chronic kidney disease: Secondary | ICD-10-CM | POA: Diagnosis not present

## 2020-01-09 DIAGNOSIS — I10 Essential (primary) hypertension: Secondary | ICD-10-CM | POA: Diagnosis not present

## 2020-01-09 LAB — BASIC METABOLIC PANEL
Anion gap: 14 (ref 5–15)
BUN: 21 mg/dL (ref 8–23)
CO2: 27 mmol/L (ref 22–32)
Calcium: 8.4 mg/dL — ABNORMAL LOW (ref 8.9–10.3)
Chloride: 99 mmol/L (ref 98–111)
Creatinine, Ser: 4.02 mg/dL — ABNORMAL HIGH (ref 0.61–1.24)
GFR calc Af Amer: 15 mL/min — ABNORMAL LOW (ref >60)
GFR calc non Af Amer: 13 mL/min — ABNORMAL LOW (ref >60)
Glucose, Bld: 106 mg/dL — ABNORMAL HIGH (ref 70–99)
Potassium: 3.9 mmol/L (ref 3.5–5.1)
Sodium: 140 mmol/L (ref 135–145)

## 2020-01-09 LAB — CBC
HCT: 25.4 % — ABNORMAL LOW (ref 39.0–52.0)
Hemoglobin: 7.6 g/dL — ABNORMAL LOW (ref 13.0–17.0)
MCH: 27.4 pg (ref 26.0–34.0)
MCHC: 29.9 g/dL — ABNORMAL LOW (ref 30.0–36.0)
MCV: 91.7 fL (ref 80.0–100.0)
Platelets: 167 10*3/uL (ref 150–400)
RBC: 2.77 MIL/uL — ABNORMAL LOW (ref 4.22–5.81)
RDW: 16.1 % — ABNORMAL HIGH (ref 11.5–15.5)
WBC: 6 10*3/uL (ref 4.0–10.5)
nRBC: 0.3 % — ABNORMAL HIGH (ref 0.0–0.2)

## 2020-01-09 MED ORDER — CARVEDILOL 3.125 MG PO TABS: 3.1250 mg | ORAL_TABLET | Freq: Two times a day (BID) | ORAL | Status: DC

## 2020-01-09 MED ORDER — ISOSORBIDE MONONITRATE ER 30 MG PO TB24: 30.0000 mg | ORAL_TABLET | Freq: Every day | ORAL | 0 refills | Status: AC

## 2020-01-09 MED ORDER — CARVEDILOL 3.125 MG PO TABS: 3.1250 mg | ORAL_TABLET | Freq: Two times a day (BID) | ORAL | 0 refills | Status: AC

## 2020-01-09 NOTE — Care Management Important Message (Signed)
Important Message  Patient Details  Name: Arthur Bowen MRN: [id number] Date of Birth: 1941-01-09   Medicare Important Message Given:  Yes     Meriel Flavors, LCSW 01/09/2020, 8:27 AM

## 2020-01-09 NOTE — Progress Notes (Signed)
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)   Elevated troponin   Acute on chronic combined systolic and diastolic CHF (congestive heart failure) [facility])    Patient Profile     79 year old male with a past medical history significant for coronary artery disease s/p CABGx 3 with a LIMA to the LAD, SVG to OM, and SVG to PD at [place], ESRD on HD, FSGS, hyperlipidemia, and hypertension who presented to the ED on 01/06/20 for an acute onset of chest pain that developed during dialysis treatment.He was given nitroglycerin with resolution in symptoms, but developed CP again and was advised to present to the ED for further evaluation. Workup in the ED was significant for high sensitivity troponin 198, 219, 215, and 226 respectively, BNP 1954, chest xray revealing small bilateral pleural effusions, right greater than left, and ECG revealing sinus tachycardia, rate of 101bpm, with a RBBB.  Most recent left heart catheterization through [facility] 2013 revealed patent grafts. Echo showed reduced lv function from previously.   Subjective   Patient is currently pain-free.  Tolerated hemodialysis well.  Has no chest pain.  States he is able to ambulate around his room without chest pain or shortness of breath.  Is anxious to go home.  Inpatient Medications    . aspirin EC  81 mg Oral Daily  . atorvastatin  40 mg Oral Daily  . carvedilol  6.25 mg Oral BID WC  . Chlorhexidine Gluconate Cloth  6 each Topical Daily  . epoetin (EPOGEN/PROCRIT) injection  10,000 Units Intravenous Q M,W,F-HD  . famotidine  20 mg Oral Daily   . finasteride  5 mg Oral Daily  . isosorbide mononitrate  30 mg Oral Daily    Vital Signs    Vitals:   01/08/20 1929 01/08/20 1950 01/09/20 0359 01/09/20 0824  BP: (!) 86/50 (!) 85/46 (!) 108/63 (!) 108/88  Pulse: 83 61 86 84  Resp: 17  17 17   Temp: 98.4 F (36.9 C)  98.1 F (36.7 C) 98.3 F (36.8 C)  TempSrc:    Oral  SpO2: 96%  99% 93%  Weight:   80.7 kg   Height:        Intake/Output Summary (Last 24 hours) at 01/09/2020 0923 Last data filed at 01/08/2020 1745 Gross per 24 hour  Intake 240 ml  Output 816 ml  Net -576 ml   Filed Weights   01/06/20 1405 01/08/20 0447 01/09/20 0359  Weight: 83.9 kg 82.1 kg 80.7 kg    Physical Exam    GEN: Well nourished, well developed, in no acute distress.  HEENT: normal.  Neck: Supple, no JVD, carotid bruits, or masses. Cardiac: RRR, no murmurs, rubs, or gallops. No clubbing, cyanosis, edema.  Radials/DP/PT 2+ and equal bilaterally.  Respiratory:  Respirations regular and unlabored, clear to auscultation bilaterally. GI: Soft, nontender, nondistended, BS + x 4. MS: no deformity or atrophy. Skin: warm and dry, no rash. Neuro:  Strength and sensation are intact. Psych: Normal affect.  Labs    CBC Recent Labs    01/08/20 0502 01/09/20 0449  WBC 6.7 6.0  HGB 7.9* 7.6*  HCT 26.3* 25.4*  MCV 91.6 91.7  PLT 190 941   Basic Metabolic Panel Recent Labs    01/07/20 1408 01/09/20 0449  NA 135 140  K 4.2 3.9  CL 96* 99  CO2 25 27  GLUCOSE 149* 106*  BUN 29* 21  CREATININE 4.70* 4.02*  CALCIUM 8.6* 8.4*  PHOS 4.8*  --    Liver Function Tests Recent Labs    01/07/20 1408  ALBUMIN 2.9*   No results for input(s): LIPASE, AMYLASE in the last 72 hours. Cardiac Enzymes No results for input(s): CKTOTAL, CKMB, CKMBINDEX, TROPONINI in the last 72 hours. BNP Recent Labs    01/06/20 1940  BNP 1,954.1*   D-Dimer No results for input(s): DDIMER in the last 72 hours. Hemoglobin A1C Recent Labs    01/07/20 0540   HGBA1C 4.8   Fasting Lipid Panel Recent Labs    01/07/20 0540  CHOL 121  HDL 27*  LDLCALC 64  TRIG 151*  CHOLHDL 4.5   Thyroid Function Tests Recent Labs    01/07/20 0540  TSH 1.972    Telemetry    Normal sinus rhythm with bundle branch block.  Rate of 82.  ECG    Normal sinus rhythm with no ischemia  Radiology    DG Chest 2 View  Result Date: 01/06/2020 CLINICAL DATA:  Left-sided chest pain EXAM: CHEST - 2 VIEW COMPARISON:  12/24/2019 FINDINGS: Interval placement right internal jugular approach hemodialysis catheter with distal tip terminating level of the superior cavoatrial junction. Stable heart size. Atherosclerotic calcification of the aortic knob. Small bilateral pleural effusions, right greater than left. Streaky bibasilar opacities, favor atelectasis. Pneumothorax. IMPRESSION: 1. Small bilateral pleural effusions, right greater than left. 2. Streaky bibasilar opacities, favor atelectasis. Electronically Signed   By: Davina Poke D.O.   On: 01/06/2020 15:01   X-ray chest PA and lateral  Result Date: 12/24/2019 CLINICAL DATA:  Shortness of breath. EXAM: CHEST - 2 VIEW COMPARISON:  06/05/2018 FINDINGS: Median sternotomy. Cardiomegaly with aortic atherosclerosis. Unchanged mediastinal contours. Bilateral pleural effusions, moderate on the right and small on the left. Vascular congestion. No pneumothorax. Multilevel degenerative change in the spine. IMPRESSION: 1. Bilateral pleural effusions, moderate on the right and small on the left. Vascular congestion. Suspect an CHF/fluid overload. 2. Cardiomegaly with aortic atherosclerosis. Electronically Signed   By: Keith Rake M.D.   On: 12/24/2019 20:49   PERIPHERAL VASCULAR CATHETERIZATION  Result Date: 12/28/2019 See op note  PERIPHERAL VASCULAR CATHETERIZATION  Result Date: 12/25/2019 See op note  ECHOCARDIOGRAM COMPLETE  Result Date: 01/08/2020    ECHOCARDIOGRAM REPORT   Patient Name:   Arthur Bowen Date of  Exam: 01/07/2020 Medical Rec #:  [id number]    Height:       72.0 in Accession #:    [id number]   Weight:       185.0 lb Date of Birth:  04/26/1941    BSA:          2.061 m Patient Age:    61 years     BP:           121/74 mmHg Patient Gender: M            HR:           105 bpm. Exam Location:  [facility] Procedure: 2D Echo Indications:     CHEST PAIN 756.50/R07.9  History:         Patient has prior history of Echocardiogram examinations, most  recent 06/06/2018. CAD, Signs/Symptoms:Shortness of Breath;                  Risk Factors:Hypertension.  Sonographer:     Avanell Shackleton Referring Phys:  [place] Diagnosing Phys: Bartholome Bill MD  Sonographer Comments: Technically difficult study due to poor echo windows. IMPRESSIONS  1. Left ventricular ejection fraction, by estimation, is 30 to 35%. The left ventricle has moderately decreased function. The left ventricle demonstrates regional wall motion abnormalities (see scoring diagram/findings for description). The left ventricular internal cavity size was mildly dilated. Left ventricular diastolic parameters were normal.  2. Right ventricular systolic function is normal. The right ventricular size is normal. There is severely elevated pulmonary artery systolic pressure.  3. Left atrial size was mildly dilated.  4. Right atrial size was mildly dilated.  5. The mitral valve was not well visualized. Mild to moderate mitral valve regurgitation.  6. The aortic valve is grossly normal. Aortic valve regurgitation is trivial. FINDINGS  Left Ventricle: Left ventricular ejection fraction, by estimation, is 30 to 35%. The left ventricle has moderately decreased function. The left ventricle demonstrates regional wall motion abnormalities. The left ventricular internal cavity size was mildly dilated. There is no left ventricular hypertrophy. Left ventricular diastolic parameters were normal.  LV Wall Scoring: The antero-lateral wall and apical lateral segment  are akinetic. The inferior septum and apex are hypokinetic. Right Ventricle: The right ventricular size is normal. No increase in right ventricular wall thickness. Right ventricular systolic function is normal. There is severely elevated pulmonary artery systolic pressure. The tricuspid regurgitant velocity is 3.73 m/s, and with an assumed right atrial pressure of 10 mmHg, the estimated right ventricular systolic pressure is 69.4 mmHg. Left Atrium: Left atrial size was mildly dilated. Right Atrium: Right atrial size was mildly dilated. Pericardium: There is no evidence of pericardial effusion. Mitral Valve: The mitral valve was not well visualized. Mild to moderate mitral valve regurgitation. Tricuspid Valve: The tricuspid valve is not well visualized. Tricuspid valve regurgitation is mild. Aortic Valve: The aortic valve is grossly normal. Aortic valve regurgitation is trivial. Pulmonic Valve: The pulmonic valve was not well visualized. Pulmonic valve regurgitation is trivial. Aorta: The aortic root was not well visualized. IAS/Shunts: The interatrial septum was not assessed.  LEFT VENTRICLE PLAX 2D LVIDd:         5.93 cm      Diastology LVIDs:         5.39 cm      LV e' lateral:   9.03 cm/s LV PW:         1.34 cm      LV E/e' lateral: 14.2 LV IVS:        1.21 cm      LV e' medial:    5.11 cm/s LVOT diam:     1.70 cm      LV E/e' medial:  25.0 LVOT Area:     2.27 cm  LV Volumes (MOD) LV vol d, MOD A2C: 242.0 ml LV vol d, MOD A4C: 244.0 ml LV vol s, MOD A2C: 186.0 ml LV vol s, MOD A4C: 164.0 ml LV SV MOD A2C:     56.0 ml LV SV MOD A4C:     244.0 ml LV SV MOD BP:      70.8 ml IVC IVC diam: 2.26 cm LEFT ATRIUM              Index       RIGHT ATRIUM  Index LA diam:        5.30 cm  2.57 cm/m  RA Area:     19.20 cm LA Vol (A2C):   148.0 ml 71.81 ml/m RA Volume:   49.30 ml  23.92 ml/m LA Vol (A4C):   96.4 ml  46.77 ml/m LA Biplane Vol: 118.0 ml 57.25 ml/m   AORTA Ao Root diam: 3.20 cm MITRAL VALVE                 TRICUSPID VALVE MV Area (PHT): 6.96 cm     TR Peak grad:   55.7 mmHg MV Decel Time: 109 msec     TR Vmax:        373.00 cm/s MR Peak grad: 130.4 mmHg MR Mean grad: 84.0 mmHg     SHUNTS MR Vmax:      571.00 cm/s   Systemic Diam: 1.70 cm MR Vmean:     438.5 cm/s MV E velocity: 128.00 cm/s MV A velocity: 72.80 cm/s MV E/A ratio:  1.76 Bartholome Bill MD Electronically signed by Bartholome Bill MD Signature Date/Time: 01/08/2020/8:53:56 AM    Final     Assessment & Plan    Principal Problem:   Chest pain Active Problems:   Coronary artery disease involving native coronary artery of native heart with angina pectoris (HCC)   Dyslipidemia   Essential (primary) hypertension   Focal segmental glomerulosclerosis   BPH with obstruction/lower urinary tract symptoms   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  Chest pain/elevated troponin              -Elevated troponin with no significant delta; no further chest pain.  Hemodynamically stable although blood pressure is somewhat soft.  Will reduce carvedilol to 3.125 mg twice daily.  Appears stable for discharge.  We will follow-up in our office early next week to make further recommendations to discuss any further ischemic work-up.             -Echocardiogram revealed reduced LV systolic function, EF estimated between 30-35%               -With newly reduced LV systolic function, will attempt medical management however if this fails, invasive evaluation can be considered.  With discharge today if there are further complications on carvedilol 3.125 mg twice daily, isosorbide 30 mg daily and enteric-coated aspirin 81 mg daily.  Patient does not need cardiac rehab.  He is not a candidate for this.               2.  Pleural effusions              -Clinically stable.  3.  ESRD on HD              -Nephrology evaluated; HD planned for today   4.  Hyperlipidemia              -Continue atorvastatin 40mg  daily    Signed, Javier Docker. Keondria Siever MD 01/09/2020, 9:23 AM  Pager: [phone]

## 2020-01-09 NOTE — Progress Notes (Signed)
[name] to be D/C'd Home per MD order. Patient given discharge teaching and paperwork regarding medications, diet, follow-up appointments and activity. Patient understanding verbalized. No questions or complaints at this time. Skin condition as charted. IV and telemetry removed prior to leaving.  No further needs by Care Management/Social Work.  An After Visit Summary was printed and given to the patient.   Patient escorted via wheelchair.  Arthur Bowen

## 2020-01-09 NOTE — Discharge Summary (Signed)
Physician Discharge Summary  Arthur Bowen MWN:[id number] DOB: January 05, 1941 DOA: 01/06/2020  PCP: Glean Hess, MD  Admit date: 01/06/2020 Discharge date: 01/09/2020  Discharge disposition: Home   Recommendations for Outpatient Follow-Up:   Outpatient follow-up with PCP 1 week Follow-up with nephrologist for hemodialysis as scheduled   Discharge Diagnosis:   Principal Problem:   Chest pain Active Problems:   Acute on chronic combined systolic and diastolic CHF (congestive heart failure) (HCC)   Coronary artery disease involving native coronary artery of native heart with angina pectoris ([place])   Dyslipidemia   Essential (primary) hypertension   Focal segmental glomerulosclerosis   BPH with obstruction/lower urinary tract symptoms   Anemia due to stage 5 chronic kidney disease (HCC)   Elevated troponin    Discharge Condition: Stable.  Diet recommendation:  Diet Order            Diet - low sodium heart healthy           Diet renal with fluid restriction Fluid restriction: 1200 mL Fluid; Room service appropriate? Yes; Fluid consistency: Thin  Diet effective now                   Code Status: Full Code     Hospital Course:   Arthur Bowen is a 79 y.o. male with medical history significant for CAD, CKD stage IVbiopsy provenFSGS, CADs/pCABG, BPH, hyperlipidemia, hypertension, left renal mass.  He was referred from the outpatient hemodialysis center because he developed chest pain/chest tightness after dialysis.  He was given nitroglycerin with some improvement in chest pain.  His daughter said there was some suspicion that chest pain/tightness may have been due to too much fluid being pulled off at dialysis.  His troponins were mildly elevated there was no significant change in troponins.  He was seen in consultation by the cardiologist recommended conservative management in addition of Imdur.  He was also seen by the nephrologist because of ESRD.  He was  treated with IV Lasix for acute on chronic diastolic CHF/fluid overload.  He also underwent hemodialysis.  He required 2 L/min oxygen via nasal cannula for acute hypoxemic respiratory failure.  Hypoxemia has resolved and he is tolerating room air. 2D echo showed EF of 30 to 35%.  He was started on low-dose carvedilol and Imdur.  His condition has improved and he wants to go home today.  He is deemed stable for discharge to home. Case discussed with cardiologist, Dr. Ubaldo Glassing and nephrologist, Dr. Holley Raring.      Discharge Exam:   Vitals:   01/09/20 0824 01/09/20 1156  BP: (!) 108/88 117/71  Pulse: 84 85  Resp: 17 18  Temp: 98.3 F (36.8 C) 98.1 F (36.7 C)  SpO2: 93% 94%   Vitals:   01/08/20 1950 01/09/20 0359 01/09/20 0824 01/09/20 1156  BP: (!) 85/46 (!) 108/63 (!) 108/88 117/71  Pulse: 61 86 84 85  Resp:  17 17 18   Temp:  98.1 F (36.7 C) 98.3 F (36.8 C) 98.1 F (36.7 C)  TempSrc:   Oral Oral  SpO2:  99% 93% 94%  Weight:  80.7 kg    Height:         GEN: NAD SKIN: No rash EYES: EOMI ENT: MMM CV: RRR PULM: Mild bilateral rhonchi,. No rales heard. ABD: soft, ND, NT, +BS CNS: AAO x 3, non focal EXT: No edema or tenderness   The results of significant diagnostics from this hospitalization (including imaging, microbiology, ancillary  and laboratory) are listed below for reference.     Procedures and Diagnostic Studies:   ECHOCARDIOGRAM COMPLETE  Result Date: 01/08/2020    ECHOCARDIOGRAM REPORT   Patient Name:   Arthur Bowen Date of Exam: 01/07/2020 Medical Rec #:  [id number]    Height:       72.0 in Accession #:    [id number]   Weight:       185.0 lb Date of Birth:  [birth year]    BSA:          2.061 m Patient Age:    28 years     BP:           121/74 mmHg Patient Gender: M            HR:           105 bpm. Exam Location:  [facility] Procedure: 2D Echo Indications:     CHEST PAIN 756.50/R07.9  History:         Patient has prior history of Echocardiogram examinations, most                   recent 06/06/2018. CAD, Signs/Symptoms:Shortness of Breath;                  Risk Factors:Hypertension.  Sonographer:     Avanell Shackleton Referring Phys:  [place] Diagnosing Phys: Bartholome Bill MD  Sonographer Comments: Technically difficult study due to poor echo windows. IMPRESSIONS  1. Left ventricular ejection fraction, by estimation, is 30 to 35%. The left ventricle has moderately decreased function. The left ventricle demonstrates regional wall motion abnormalities (see scoring diagram/findings for description). The left ventricular internal cavity size was mildly dilated. Left ventricular diastolic parameters were normal.  2. Right ventricular systolic function is normal. The right ventricular size is normal. There is severely elevated pulmonary artery systolic pressure.  3. Left atrial size was mildly dilated.  4. Right atrial size was mildly dilated.  5. The mitral valve was not well visualized. Mild to moderate mitral valve regurgitation.  6. The aortic valve is grossly normal. Aortic valve regurgitation is trivial. FINDINGS  Left Ventricle: Left ventricular ejection fraction, by estimation, is 30 to 35%. The left ventricle has moderately decreased function. The left ventricle demonstrates regional wall motion abnormalities. The left ventricular internal cavity size was mildly dilated. There is no left ventricular hypertrophy. Left ventricular diastolic parameters were normal.  LV Wall Scoring: The antero-lateral wall and apical lateral segment are akinetic. The inferior septum and apex are hypokinetic. Right Ventricle: The right ventricular size is normal. No increase in right ventricular wall thickness. Right ventricular systolic function is normal. There is severely elevated pulmonary artery systolic pressure. The tricuspid regurgitant velocity is 3.73 m/s, and with an assumed right atrial pressure of 10 mmHg, the estimated right ventricular systolic pressure is 02.6 mmHg. Left  Atrium: Left atrial size was mildly dilated. Right Atrium: Right atrial size was mildly dilated. Pericardium: There is no evidence of pericardial effusion. Mitral Valve: The mitral valve was not well visualized. Mild to moderate mitral valve regurgitation. Tricuspid Valve: The tricuspid valve is not well visualized. Tricuspid valve regurgitation is mild. Aortic Valve: The aortic valve is grossly normal. Aortic valve regurgitation is trivial. Pulmonic Valve: The pulmonic valve was not well visualized. Pulmonic valve regurgitation is trivial. Aorta: The aortic root was not well visualized. IAS/Shunts: The interatrial septum was not assessed.  LEFT VENTRICLE PLAX 2D LVIDd:  5.93 cm      Diastology LVIDs:         5.39 cm      LV e' lateral:   9.03 cm/s LV PW:         1.34 cm      LV E/e' lateral: 14.2 LV IVS:        1.21 cm      LV e' medial:    5.11 cm/s LVOT diam:     1.70 cm      LV E/e' medial:  25.0 LVOT Area:     2.27 cm  LV Volumes (MOD) LV vol d, MOD A2C: 242.0 ml LV vol d, MOD A4C: 244.0 ml LV vol s, MOD A2C: 186.0 ml LV vol s, MOD A4C: 164.0 ml LV SV MOD A2C:     56.0 ml LV SV MOD A4C:     244.0 ml LV SV MOD BP:      70.8 ml IVC IVC diam: 2.26 cm LEFT ATRIUM              Index       RIGHT ATRIUM           Index LA diam:        5.30 cm  2.57 cm/m  RA Area:     19.20 cm LA Vol (A2C):   148.0 ml 71.81 ml/m RA Volume:   49.30 ml  23.92 ml/m LA Vol (A4C):   96.4 ml  46.77 ml/m LA Biplane Vol: 118.0 ml 57.25 ml/m   AORTA Ao Root diam: 3.20 cm MITRAL VALVE                TRICUSPID VALVE MV Area (PHT): 6.96 cm     TR Peak grad:   55.7 mmHg MV Decel Time: 109 msec     TR Vmax:        373.00 cm/s MR Peak grad: 130.4 mmHg MR Mean grad: 84.0 mmHg     SHUNTS MR Vmax:      571.00 cm/s   Systemic Diam: 1.70 cm MR Vmean:     438.5 cm/s MV E velocity: 128.00 cm/s MV A velocity: 72.80 cm/s MV E/A ratio:  1.76 Bartholome Bill MD Electronically signed by Bartholome Bill MD Signature Date/Time: 01/08/2020/8:53:56 AM     Final      Labs:   Basic Metabolic Panel: Recent Labs  Lab 01/06/20 1405 01/06/20 1405 01/07/20 1408 01/09/20 0449  NA 137  --  135 140  K 3.6   < > 4.2 3.9  CL 95*  --  96* 99  CO2 26  --  25 27  GLUCOSE 100*  --  149* 106*  BUN 21  --  29* 21  CREATININE 3.70*  --  4.70* 4.02*  CALCIUM 8.7*  --  8.6* 8.4*  PHOS  --   --  4.8*  --    < > = values in this interval not displayed.   GFR Estimated Creatinine Clearance: 15.8 mL/min (A) (by C-G formula based on SCr of 4.02 mg/dL (H)). Liver Function Tests: Recent Labs  Lab 01/07/20 1408  ALBUMIN 2.9*   No results for input(s): LIPASE, AMYLASE in the last 168 hours. No results for input(s): AMMONIA in the last 168 hours. Coagulation profile Recent Labs  Lab 01/06/20 2005  INR 1.0    CBC: Recent Labs  Lab 01/06/20 1405 01/07/20 1408 01/08/20 0502 01/09/20 0449  WBC 5.6 7.3 6.7 6.0  HGB 8.0* 8.1* 7.9*  7.6*  HCT 26.7* 27.2* 26.3* 25.4*  MCV 91.8 92.2 91.6 91.7  PLT 204 190 190 167   Cardiac Enzymes: No results for input(s): CKTOTAL, CKMB, CKMBINDEX, TROPONINI in the last 168 hours. BNP: Invalid input(s): POCBNP CBG: No results for input(s): GLUCAP in the last 168 hours. D-Dimer No results for input(s): DDIMER in the last 72 hours. Hgb A1c Recent Labs    01/07/20 0540  HGBA1C 4.8   Lipid Profile Recent Labs    01/07/20 0540  CHOL 121  HDL 27*  LDLCALC 64  TRIG 151*  CHOLHDL 4.5   Thyroid function studies Recent Labs    01/07/20 0540  TSH 1.972   Anemia work up No results for input(s): VITAMINB12, FOLATE, FERRITIN, TIBC, IRON, RETICCTPCT in the last 72 hours. Microbiology Recent Results (from the past 240 hour(s))  SARS CORONAVIRUS 2 (TAT 6-24 HRS) Nasopharyngeal Nasopharyngeal Swab     Status: None   Collection Time: 01/06/20  8:05 PM   Specimen: Nasopharyngeal Swab  Result Value Ref Range Status   SARS Coronavirus 2 NEGATIVE NEGATIVE Final    Comment: (NOTE) SARS-CoV-2 target  nucleic acids are NOT DETECTED.  The SARS-CoV-2 RNA is generally detectable in upper and lower respiratory specimens during the acute phase of infection. Negative results do not preclude SARS-CoV-2 infection, do not rule out co-infections with other pathogens, and should not be used as the sole basis for treatment or other patient management decisions. Negative results must be combined with clinical observations, patient history, and epidemiological information. The expected result is Negative.  Fact Sheet for Patients: SugarRoll.be  Fact Sheet for Healthcare Providers: https://www.woods-mathews.com/  This test is not yet approved or cleared by the [place] FDA and  has been authorized for detection and/or diagnosis of SARS-CoV-2 by FDA under an Emergency Use Authorization (EUA). This EUA will remain  in effect (meaning this test can be used) for the duration of the COVID-19 declaration under Se ction 564(b)(1) of the Act, 21 U.S.C. section 360bbb-3(b)(1), unless the authorization is terminated or revoked sooner.  Performed at [facility], [place] [address], [place], [place] [address]   MRSA PCR Screening     Status: None   Collection Time: 01/07/20  7:48 PM   Specimen: Nasopharyngeal  Result Value Ref Range Status   MRSA by PCR NEGATIVE NEGATIVE Final    Comment:        The GeneXpert MRSA Assay (FDA approved for NASAL specimens only), is one component of a comprehensive MRSA colonization surveillance program. It is not intended to diagnose MRSA infection nor to guide or monitor treatment for MRSA infections. Performed at [facility], [address], [place], [place] [address]      Discharge Instructions:   Discharge Instructions    AMB referral to CHF clinic   Complete by: As directed    Diet - low sodium heart healthy   Complete by: As directed    Discharge instructions   Complete by: As directed     Follow-up with nephrologist for outpatient hemodialysis as scheduled on Mondays, Wednesdays and Fridays   Increase activity slowly   Complete by: As directed      Allergies as of 01/09/2020      Reactions   Penicillins Hives   Sulfa Antibiotics Hives      Medication List    STOP taking these medications   Veltassa 8.4 g packet Generic drug: patiromer     TAKE these medications   aspirin 81  MG tablet Take 1 tablet by mouth daily. Reported on 09/02/2015   atorvastatin 40 MG tablet Commonly known as: LIPITOR Take 1 tablet by mouth once daily   carvedilol 3.125 MG tablet Commonly known as: COREG Take 1 tablet (3.125 mg total) by mouth 2 (two) times daily with a meal.   cyanocobalamin 1000 MCG/ML injection Commonly known as: (VITAMIN B-12) INJECT 1,000 MCG (1ML) AS DIRECTED EVERY 30 DAYS   famotidine 20 MG tablet Commonly known as: PEPCID Take 1 tablet (20 mg total) by mouth daily.   finasteride 5 MG tablet Commonly known as: PROSCAR Take 1 tablet (5 mg total) by mouth daily.   isosorbide mononitrate 30 MG 24 hr tablet Commonly known as: IMDUR Take 1 tablet (30 mg total) by mouth daily. Start taking on: January 10, 2020   nitroGLYCERIN 0.4 MG SL tablet Commonly known as: NITROSTAT PLACE 1 TABLET UNDER THE TONGUE EVERY 5 MINUTES AS NEEDED FOR CHEST PAIN       Follow-up Information    Teodoro Spray, MD Follow up in 1 week(s).   Specialty: Cardiology Contact information: [place] [address] [phone]                Time coordinating discharge: 32 minutes  Signed:  Jennye Boroughs  [facility] 01/09/2020, 11:59 AM

## 2020-01-09 NOTE — Progress Notes (Signed)
[facility]  ROUNDING NOTE   Subjective:  Patient underwent dialysis yesterday. Overall much improved. Denies chest pain.   Objective:  Vital signs in last 24 hours:  Temp:  [98.1 F (36.7 C)-98.4 F (36.9 C)] 98.1 F (36.7 C) (07/24 1156) Pulse Rate:  [61-86] 85 (07/24 1156) Resp:  [17-18] 18 (07/24 1156) BP: (85-117)/(46-88) 117/71 (07/24 1156) SpO2:  [93 %-99 %] 94 % (07/24 1156) Weight:  [80.7 kg] 80.7 kg (07/24 0359)  Weight change: -1.361 kg Filed Weights   01/06/20 1405 01/08/20 0447 01/09/20 0359  Weight: 83.9 kg 82.1 kg 80.7 kg    Intake/Output: I/O last 3 completed shifts: In: 557 [P.O.:480; I.V.:77] Out: 816 [Urine:175; Other:641]   Intake/Output this shift:  Total I/O In: -  Out: 100 [Urine:100]  Physical Exam: General: NAD  Head: Normocephalic, atraumatic. Moist oral mucosal membranes  Eyes: Anicteric  Neck: Supple, trachea midline  Lungs:  Clear to auscultation, normal effort  Heart: S1S2 no rubs  Abdomen:  Soft, nontender, bowel sounds present  Extremities: Trace lower extremity edema  Neurologic: Awake, alert, conversant  Skin: No lesions  Access: RIJ permcath, left AVF maturing    Basic Metabolic Panel: Recent Labs  Lab 01/06/20 1405 01/07/20 1408 01/09/20 0449  NA 137 135 140  K 3.6 4.2 3.9  CL 95* 96* 99  CO2 26 25 27   GLUCOSE 100* 149* 106*  BUN 21 29* 21  CREATININE 3.70* 4.70* 4.02*  CALCIUM 8.7* 8.6* 8.4*  PHOS  --  4.8*  --     Liver Function Tests: Recent Labs  Lab 01/07/20 1408  ALBUMIN 2.9*   No results for input(s): LIPASE, AMYLASE in the last 168 hours. No results for input(s): AMMONIA in the last 168 hours.  CBC: Recent Labs  Lab 01/06/20 1405 01/07/20 1408 01/08/20 0502 01/09/20 0449  WBC 5.6 7.3 6.7 6.0  HGB 8.0* 8.1* 7.9* 7.6*  HCT 26.7* 27.2* 26.3* 25.4*  MCV 91.8 92.2 91.6 91.7  PLT 204 190 190 167    Cardiac Enzymes: No results for input(s): CKTOTAL, CKMB, CKMBINDEX, TROPONINI  in the last 168 hours.  BNP: Invalid input(s): POCBNP  CBG: No results for input(s): GLUCAP in the last 168 hours.  Microbiology: Results for orders placed or performed during the hospital encounter of 01/06/20  SARS CORONAVIRUS 2 (TAT 6-24 HRS) Nasopharyngeal Nasopharyngeal Swab     Status: None   Collection Time: 01/06/20  8:05 PM   Specimen: Nasopharyngeal Swab  Result Value Ref Range Status   SARS Coronavirus 2 NEGATIVE NEGATIVE Final    Comment: (NOTE) SARS-CoV-2 target nucleic acids are NOT DETECTED.  The SARS-CoV-2 RNA is generally detectable in upper and lower respiratory specimens during the acute phase of infection. Negative results do not preclude SARS-CoV-2 infection, do not rule out co-infections with other pathogens, and should not be used as the sole basis for treatment or other patient management decisions. Negative results must be combined with clinical observations, patient history, and epidemiological information. The expected result is Negative.  Fact Sheet for Patients: SugarRoll.be  Fact Sheet for Healthcare Providers: https://www.woods-mathews.com/  This test is not yet approved or cleared by the [place] FDA and  has been authorized for detection and/or diagnosis of SARS-CoV-2 by FDA under an Emergency Use Authorization (EUA). This EUA will remain  in effect (meaning this test can be used) for the duration of the COVID-19 declaration under Se ction 564(b)(1) of the Act, 21 U.S.C. section 360bbb-3(b)(1), unless the authorization is  terminated or revoked sooner.  Performed at [facility], [place] [address] [place], [place] [address]   MRSA PCR Screening     Status: None   Collection Time: 01/07/20  7:48 PM   Specimen: Nasopharyngeal  Result Value Ref Range Status   MRSA by PCR NEGATIVE NEGATIVE Final    Comment:        The GeneXpert MRSA Assay (FDA approved for NASAL specimens only), is one  component of a comprehensive MRSA colonization surveillance program. It is not intended to diagnose MRSA infection nor to guide or monitor treatment for MRSA infections. Performed at [facility], [address] [place], [place] [address]     Coagulation Studies: Recent Labs    01/06/20 2003-10-02  LABPROT 12.3  INR 1.0    Urinalysis: No results for input(s): COLORURINE, LABSPEC, PHURINE, GLUCOSEU, HGBUR, BILIRUBINUR, KETONESUR, PROTEINUR, UROBILINOGEN, NITRITE, LEUKOCYTESUR in the last 72 hours.  Invalid input(s): APPERANCEUR    Imaging: ECHOCARDIOGRAM COMPLETE  Result Date: 01/08/2020    ECHOCARDIOGRAM REPORT   Patient Name:   Arthur Bowen Date of Exam: 01/07/2020 Medical Rec #:  [id number]    Height:       72.0 in Accession #:    [id number]   Weight:       185.0 lb Date of Birth:  [birth year]    BSA:          2.061 m Patient Age:    1 years     BP:           121/74 mmHg Patient Gender: M            HR:           105 bpm. Exam Location:  [facility] Procedure: 2D Echo Indications:     CHEST PAIN 756.50/R07.9  History:         Patient has prior history of Echocardiogram examinations, most                  recent 06/06/2018. CAD, Signs/Symptoms:Shortness of Breath;                  Risk Factors:Hypertension.  Sonographer:     Avanell Shackleton Referring Phys:  [place] Diagnosing Phys: Bartholome Bill MD  Sonographer Comments: Technically difficult study due to poor echo windows. IMPRESSIONS  1. Left ventricular ejection fraction, by estimation, is 30 to 35%. The left ventricle has moderately decreased function. The left ventricle demonstrates regional wall motion abnormalities (see scoring diagram/findings for description). The left ventricular internal cavity size was mildly dilated. Left ventricular diastolic parameters were normal.  2. Right ventricular systolic function is normal. The right ventricular size is normal. There is severely elevated pulmonary artery systolic  pressure.  3. Left atrial size was mildly dilated.  4. Right atrial size was mildly dilated.  5. The mitral valve was not well visualized. Mild to moderate mitral valve regurgitation.  6. The aortic valve is grossly normal. Aortic valve regurgitation is trivial. FINDINGS  Left Ventricle: Left ventricular ejection fraction, by estimation, is 30 to 35%. The left ventricle has moderately decreased function. The left ventricle demonstrates regional wall motion abnormalities. The left ventricular internal cavity size was mildly dilated. There is no left ventricular hypertrophy. Left ventricular diastolic parameters were normal.  LV Wall Scoring: The antero-lateral wall and apical lateral segment are akinetic. The inferior septum and apex are hypokinetic. Right Ventricle: The right ventricular size is normal. No increase in right ventricular wall  thickness. Right ventricular systolic function is normal. There is severely elevated pulmonary artery systolic pressure. The tricuspid regurgitant velocity is 3.73 m/s, and with an assumed right atrial pressure of 10 mmHg, the estimated right ventricular systolic pressure is 16.1 mmHg. Left Atrium: Left atrial size was mildly dilated. Right Atrium: Right atrial size was mildly dilated. Pericardium: There is no evidence of pericardial effusion. Mitral Valve: The mitral valve was not well visualized. Mild to moderate mitral valve regurgitation. Tricuspid Valve: The tricuspid valve is not well visualized. Tricuspid valve regurgitation is mild. Aortic Valve: The aortic valve is grossly normal. Aortic valve regurgitation is trivial. Pulmonic Valve: The pulmonic valve was not well visualized. Pulmonic valve regurgitation is trivial. Aorta: The aortic root was not well visualized. IAS/Shunts: The interatrial septum was not assessed.  LEFT VENTRICLE PLAX 2D LVIDd:         5.93 cm      Diastology LVIDs:         5.39 cm      LV e' lateral:   9.03 cm/s LV PW:         1.34 cm      LV E/e'  lateral: 14.2 LV IVS:        1.21 cm      LV e' medial:    5.11 cm/s LVOT diam:     1.70 cm      LV E/e' medial:  25.0 LVOT Area:     2.27 cm  LV Volumes (MOD) LV vol d, MOD A2C: 242.0 ml LV vol d, MOD A4C: 244.0 ml LV vol s, MOD A2C: 186.0 ml LV vol s, MOD A4C: 164.0 ml LV SV MOD A2C:     56.0 ml LV SV MOD A4C:     244.0 ml LV SV MOD BP:      70.8 ml IVC IVC diam: 2.26 cm LEFT ATRIUM              Index       RIGHT ATRIUM           Index LA diam:        5.30 cm  2.57 cm/m  RA Area:     19.20 cm LA Vol (A2C):   148.0 ml 71.81 ml/m RA Volume:   49.30 ml  23.92 ml/m LA Vol (A4C):   96.4 ml  46.77 ml/m LA Biplane Vol: 118.0 ml 57.25 ml/m   AORTA Ao Root diam: 3.20 cm MITRAL VALVE                TRICUSPID VALVE MV Area (PHT): 6.96 cm     TR Peak grad:   55.7 mmHg MV Decel Time: 109 msec     TR Vmax:        373.00 cm/s MR Peak grad: 130.4 mmHg MR Mean grad: 84.0 mmHg     SHUNTS MR Vmax:      571.00 cm/s   Systemic Diam: 1.70 cm MR Vmean:     438.5 cm/s MV E velocity: 128.00 cm/s MV A velocity: 72.80 cm/s MV E/A ratio:  1.76 Bartholome Bill MD Electronically signed by Bartholome Bill MD Signature Date/Time: 01/08/2020/8:53:56 AM    Final      Medications:   . methocarbamol (ROBAXIN) IV Stopped (01/07/20 0001)   . aspirin EC  81 mg Oral Daily  . atorvastatin  40 mg Oral Daily  . carvedilol  3.125 mg Oral BID WC  . Chlorhexidine Gluconate Cloth  6 each Topical Daily  .  epoetin (EPOGEN/PROCRIT) injection  10,000 Units Intravenous Q M,W,F-HD  . famotidine  20 mg Oral Daily  . finasteride  5 mg Oral Daily  . isosorbide mononitrate  30 mg Oral Daily   acetaminophen, methocarbamol (ROBAXIN) IV, morphine injection  Assessment/ Plan:  Arthur Bowen is a 79 y.o. white male with end stage renal disease on hemodialysis, hypertension, hyperlipidemia, BPH, coronary artery disease status post CABG, GERD who is admitted to [facility] on 01/06/2020 for ESRD on dialysis [facility]) [N18.6, Z99.2] NSTEMI (non-ST elevated  myocardial infarction) ([place]) [I21.4] Nonspecific chest pain [R07.9]  [facility] MWF [facility] RIJ permcath 83.5kg RIJ permcath   1. End Stage Renal Disease: Patient underwent dialysis treatment yesterday.  Tolerated well.  Next dialysis treatment on Monday as an outpatient.  2. Hypertension: Continue carvedilol and isosorbide mononitrate.  3. Anemia of chronic kidney disease: hemoglobin 7.9 -Maintain the patient on Epogen as an outpatient.  4. Secondary Hyperparathyroidism: Monitor bone mineral metabolism parameters as an outpatient.   LOS: 0 Kynlea Blackston 7/24/20212:56 PM

## 2020-01-09 NOTE — Care Management Important Message (Signed)
Important Message  Patient Details  Name: ADDIS TUOHY MRN: [id number] Date of Birth: Jul 05, 1940   Medicare Important Message Given:        Meriel Flavors, LCSW 01/09/2020, 8:25 AM

## 2020-01-11 ENCOUNTER — Telehealth: Payer: Self-pay | Admitting: Family

## 2020-01-11 DIAGNOSIS — Z992 Dependence on renal dialysis: Secondary | ICD-10-CM | POA: Diagnosis not present

## 2020-01-11 DIAGNOSIS — N186 End stage renal disease: Secondary | ICD-10-CM | POA: Diagnosis not present

## 2020-01-11 NOTE — Telephone Encounter (Signed)
Unable to reach patient to schedule a new patient CHF Clinic appointment after his recent discharge. LVM for patient to call and schedule.   Kou Gucciardo, NT

## 2020-01-14 ENCOUNTER — Ambulatory Visit (INDEPENDENT_AMBULATORY_CARE_PROVIDER_SITE_OTHER): Payer: Medicare Other | Admitting: Vascular Surgery

## 2020-01-19 ENCOUNTER — Ambulatory Visit: Payer: Medicare Other | Admitting: Hematology and Oncology

## 2020-01-19 ENCOUNTER — Ambulatory Visit: Payer: Medicare Other

## 2020-01-19 ENCOUNTER — Other Ambulatory Visit: Payer: Medicare Other

## 2020-02-17 DEATH — deceased

## 2020-08-19 ENCOUNTER — Encounter: Payer: Medicare Other | Admitting: Internal Medicine

## 2020-12-10 IMAGING — CT CT ABDOMEN AND PELVIS WITHOUT CONTRAST
1 of 2 series · 15 of 32 positions shown, 19 images · non-contrast
Comparison: CT scan of August 24, 2015.  Ultrasound November 18, 2017.

CLINICAL DATA: Lower abdominal pain, hematuria.

EXAM:
CT ABDOMEN AND PELVIS WITHOUT CONTRAST
TECHNIQUE: Multidetector CT imaging of the abdomen and pelvis was performed
following the standard protocol without IV contrast.

[Series 2: axial st · axial · 0.84mm/px · z∈[-860,-470]mm · 15 of 86 slices shown, 19 images]
[im 4/86  soft-tissue]
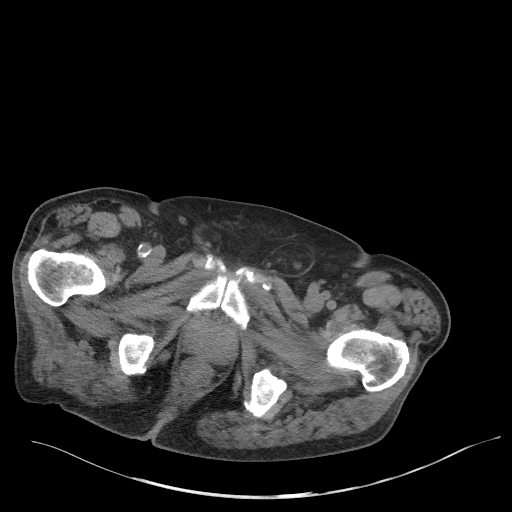
[im 4/86  bone]
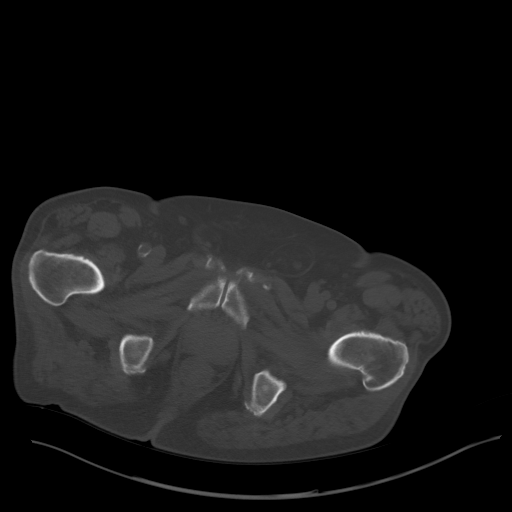
[im 11/86  soft-tissue]
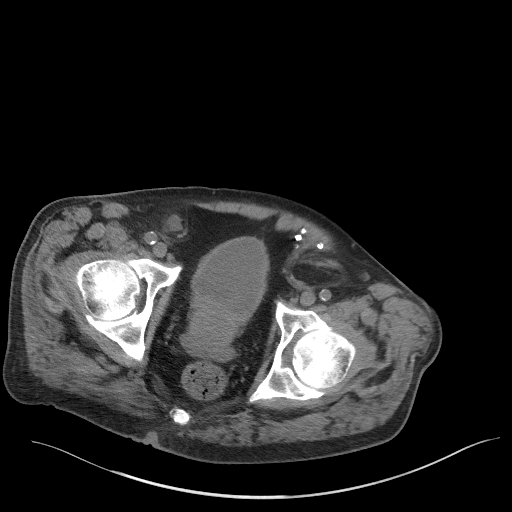
[im 18/86  soft-tissue]
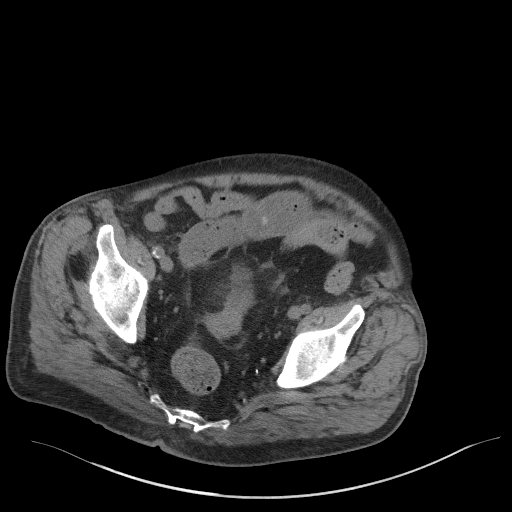
[im 24/86  soft-tissue]
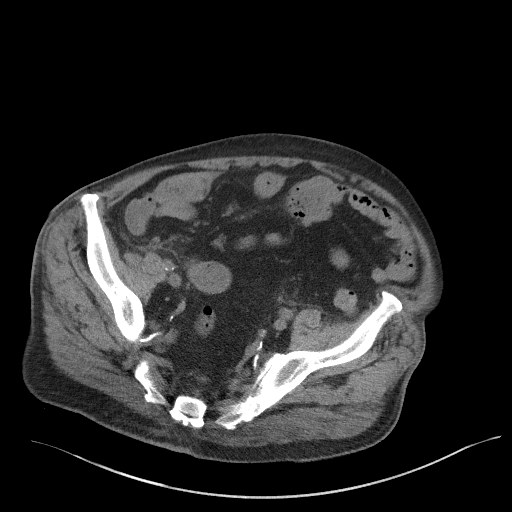
[im 31/86  soft-tissue]
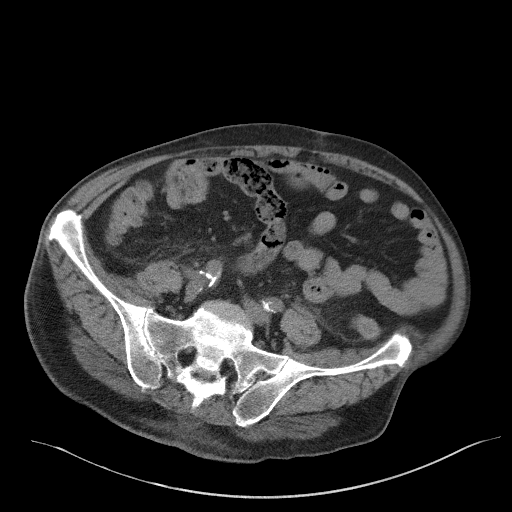
[im 38/86  soft-tissue]
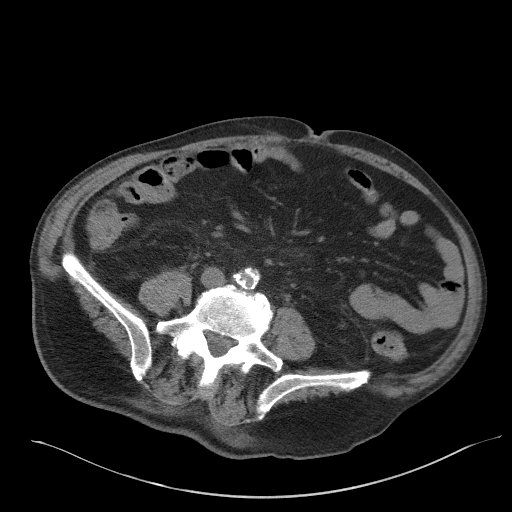
[im 45/86  soft-tissue]
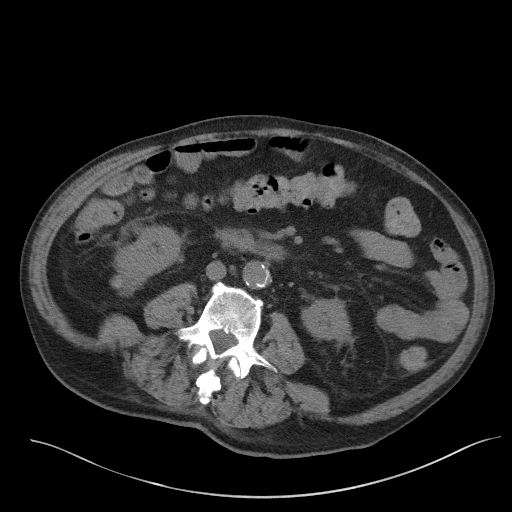
[im 48/86  soft-tissue]
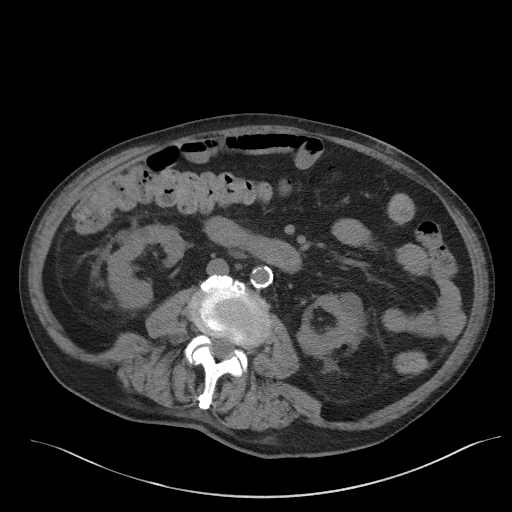
[im 55/86  soft-tissue]
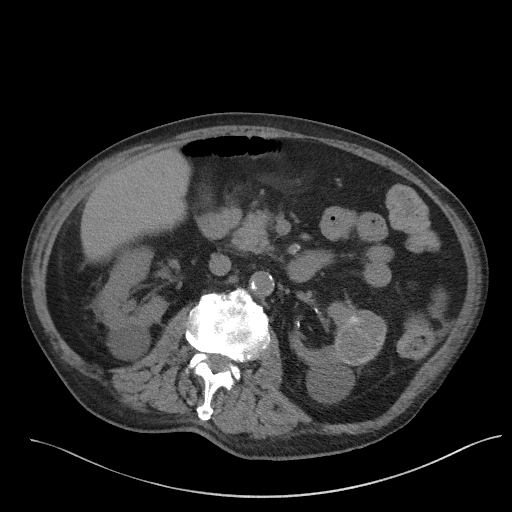
[im 55/86  bone]
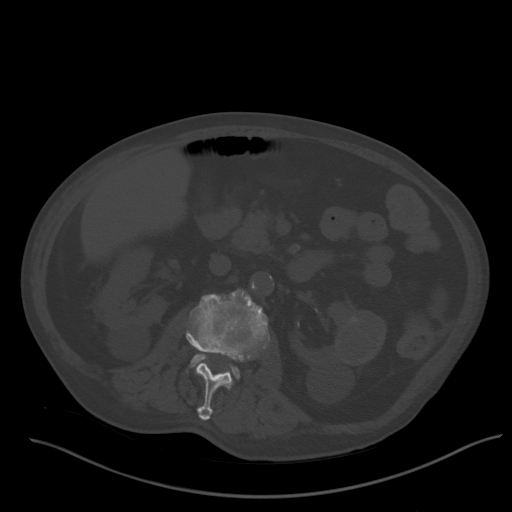
[im 62/86  soft-tissue]
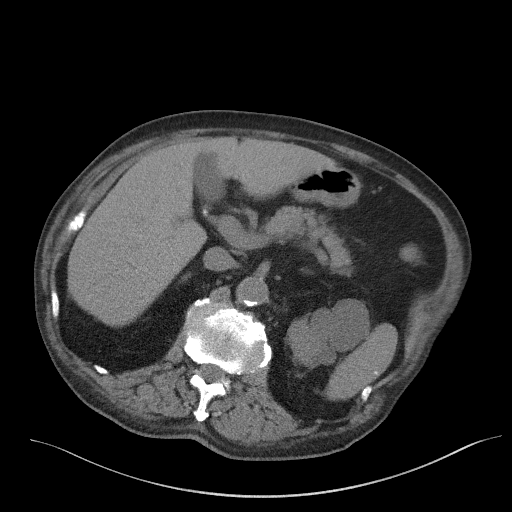
[im 69/86  soft-tissue]
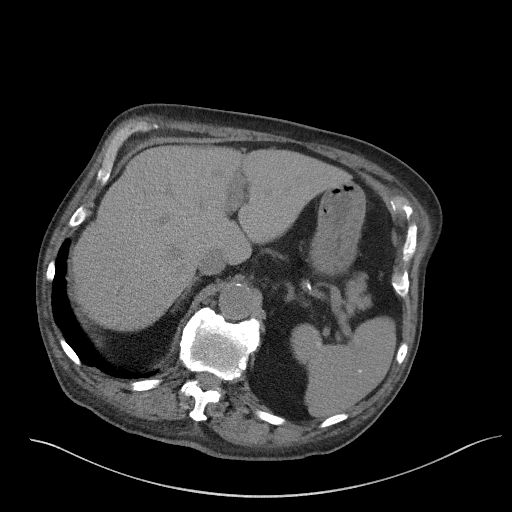
[im 72/86  lung]
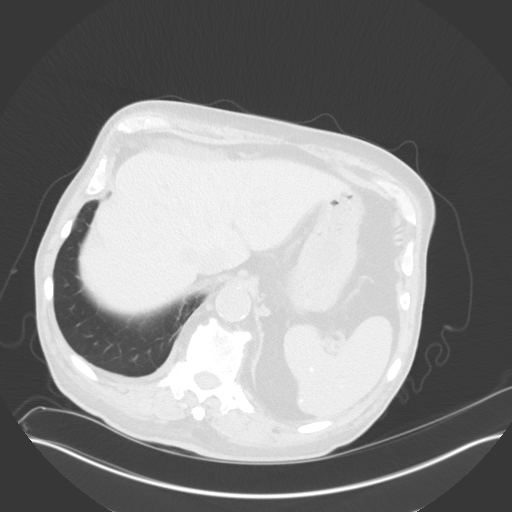
[im 75/86  soft-tissue]
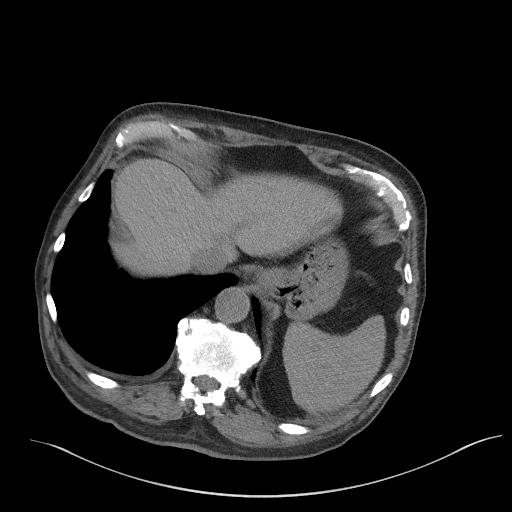
[im 75/86  lung]
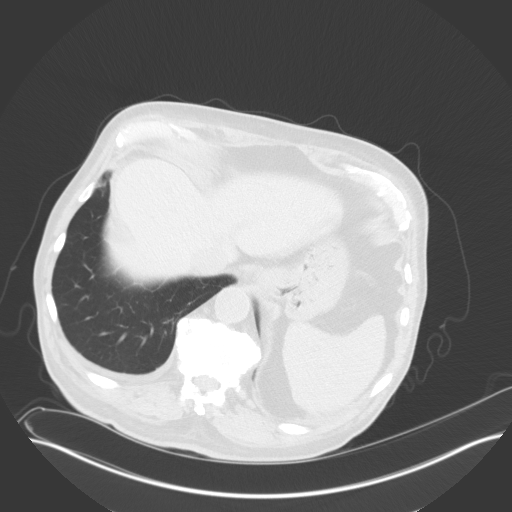
[im 79/86  lung]
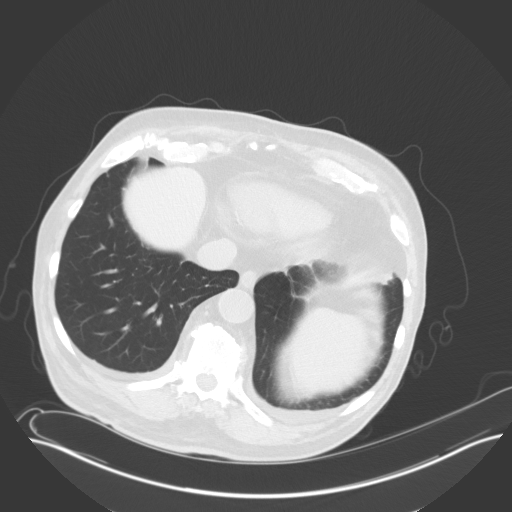
[im 82/86  soft-tissue]
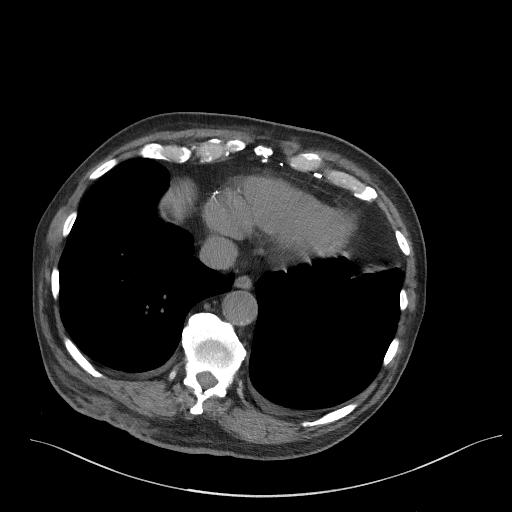
[im 82/86  lung]
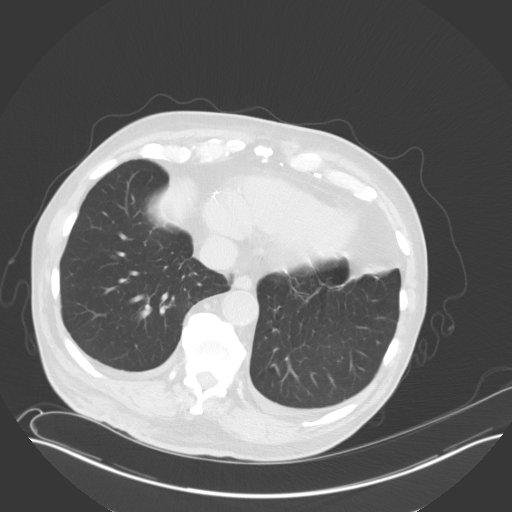

[15 of 32 positions shown; findings below may reference images not displayed]

FINDINGS: Lower chest: No acute abnormality.

Hepatobiliary: Solitary gallstone is again noted. No evidence of
cholecystitis. No biliary dilatation is noted. No abnormality is
seen in the liver on these unenhanced images.

Pancreas: Unremarkable. No pancreatic ductal dilatation or
surrounding inflammatory changes.

Spleen: Calcified splenic granulomata are noted. Spleen is normal in
size.

Adrenals/Urinary Tract: Adrenal glands appear normal. Stable
bilateral renal cysts are noted. Stable 4.8 cm complex and
peripherally calcified exophytic lesion is seen arising from midpole
of left kidney. No hydronephrosis or renal obstruction is noted. No
renal or ureteral calculi are noted. Urinary bladder is
unremarkable.

Stomach/Bowel: Stomach is within normal limits. Appendix appears
normal. No evidence of bowel wall thickening, distention, or
inflammatory changes.

Vascular/Lymphatic: Aortic atherosclerosis. No enlarged abdominal or
pelvic lymph nodes.

Reproductive: Mild prostatic enlargement is noted.

Other: Mild fat containing left inguinal hernia is noted. Evidence
of left inguinal hernia surgical repair is also noted. No ascites is
noted.

Musculoskeletal: No acute or significant osseous findings.
IMPRESSION: Cholelithiasis without inflammation.

No hydronephrosis or renal obstruction is noted. No renal or
ureteral calculi are noted.

Stable 4.8 cm complex and peripherally calcified exophytic lesion is
seen arising from midpole of left kidney most consistent with benign
complex or proteinaceous cyst.

Mild prostatic enlargement.

Aortic Atherosclerosis ([id number]).
